# Patient Record
Sex: Female | Born: 1950 | Race: Black or African American | Hispanic: No | Marital: Married | Smoking: Never smoker
Health system: Southern US, Community
[De-identification: ages and names within clinical notes are randomized; demographics above are authoritative.]

## PROBLEM LIST (undated history)

## (undated) DIAGNOSIS — K219 Gastro-esophageal reflux disease without esophagitis: Secondary | ICD-10-CM

## (undated) DIAGNOSIS — J45909 Unspecified asthma, uncomplicated: Secondary | ICD-10-CM

## (undated) DIAGNOSIS — G4730 Sleep apnea, unspecified: Secondary | ICD-10-CM

## (undated) DIAGNOSIS — I350 Nonrheumatic aortic (valve) stenosis: Secondary | ICD-10-CM

## (undated) DIAGNOSIS — E119 Type 2 diabetes mellitus without complications: Secondary | ICD-10-CM

## (undated) DIAGNOSIS — M1990 Unspecified osteoarthritis, unspecified site: Secondary | ICD-10-CM

## (undated) DIAGNOSIS — I10 Essential (primary) hypertension: Secondary | ICD-10-CM

## (undated) DIAGNOSIS — R072 Precordial pain: Secondary | ICD-10-CM

## (undated) DIAGNOSIS — E785 Hyperlipidemia, unspecified: Secondary | ICD-10-CM

## (undated) DIAGNOSIS — J189 Pneumonia, unspecified organism: Secondary | ICD-10-CM

## (undated) HISTORY — PX: TONSILLECTOMY: SUR1361

## (undated) HISTORY — DX: Unspecified osteoarthritis, unspecified site: M19.90

## (undated) HISTORY — DX: Unspecified asthma, uncomplicated: J45.909

## (undated) HISTORY — DX: Hyperlipidemia, unspecified: E78.5

## (undated) HISTORY — PX: TUBAL LIGATION: SHX77

## (undated) HISTORY — DX: Gastro-esophageal reflux disease without esophagitis: K21.9

## (undated) HISTORY — DX: Nonrheumatic aortic (valve) stenosis: I35.0

## (undated) HISTORY — DX: Precordial pain: R07.2

## (undated) HISTORY — DX: Sleep apnea, unspecified: G47.30

## (undated) HISTORY — DX: Type 2 diabetes mellitus without complications: E11.9

## (undated) HISTORY — DX: Essential (primary) hypertension: I10

## (undated) HISTORY — DX: Pneumonia, unspecified organism: J18.9

## (undated) HISTORY — PX: JOINT REPLACEMENT: SHX530

---

## 1977-01-02 HISTORY — PX: TONSILLECTOMY: SHX5217

## 1987-01-03 HISTORY — PX: ABDOMINAL HYSTERECTOMY: SHX81

## 2002-03-02 ENCOUNTER — Encounter: Payer: Self-pay | Admitting: Emergency Medicine

## 2002-03-02 ENCOUNTER — Emergency Department (HOSPITAL_COMMUNITY): Admission: EM | Admit: 2002-03-02 | Discharge: 2002-03-02 | Payer: Self-pay | Admitting: Emergency Medicine

## 2002-04-23 ENCOUNTER — Encounter: Admission: RE | Admit: 2002-04-23 | Discharge: 2002-04-23 | Payer: Self-pay | Admitting: Nephrology

## 2002-04-23 ENCOUNTER — Encounter: Payer: Self-pay | Admitting: Nephrology

## 2002-07-18 ENCOUNTER — Encounter: Payer: Self-pay | Admitting: Cardiology

## 2002-07-18 ENCOUNTER — Ambulatory Visit: Admission: RE | Admit: 2002-07-18 | Discharge: 2002-07-18 | Payer: Self-pay | Admitting: Nephrology

## 2005-08-31 ENCOUNTER — Ambulatory Visit: Payer: Self-pay | Admitting: Internal Medicine

## 2005-10-18 ENCOUNTER — Ambulatory Visit: Payer: Self-pay | Admitting: Internal Medicine

## 2005-11-01 ENCOUNTER — Ambulatory Visit: Payer: Self-pay | Admitting: Internal Medicine

## 2005-11-01 ENCOUNTER — Encounter (INDEPENDENT_AMBULATORY_CARE_PROVIDER_SITE_OTHER): Payer: Self-pay | Admitting: Specialist

## 2006-10-22 ENCOUNTER — Telehealth (INDEPENDENT_AMBULATORY_CARE_PROVIDER_SITE_OTHER): Payer: Self-pay | Admitting: *Deleted

## 2006-12-18 ENCOUNTER — Telehealth (INDEPENDENT_AMBULATORY_CARE_PROVIDER_SITE_OTHER): Payer: Self-pay | Admitting: *Deleted

## 2007-01-03 HISTORY — PX: LAPAROSCOPIC GASTRIC BANDING: SHX1100

## 2007-04-01 ENCOUNTER — Ambulatory Visit: Payer: Self-pay | Admitting: Family Medicine

## 2007-04-01 DIAGNOSIS — Z9189 Other specified personal risk factors, not elsewhere classified: Secondary | ICD-10-CM | POA: Insufficient documentation

## 2007-04-01 DIAGNOSIS — E1165 Type 2 diabetes mellitus with hyperglycemia: Secondary | ICD-10-CM | POA: Insufficient documentation

## 2007-04-01 DIAGNOSIS — M170 Bilateral primary osteoarthritis of knee: Secondary | ICD-10-CM

## 2007-04-01 DIAGNOSIS — R0602 Shortness of breath: Secondary | ICD-10-CM | POA: Insufficient documentation

## 2007-04-01 DIAGNOSIS — E1159 Type 2 diabetes mellitus with other circulatory complications: Secondary | ICD-10-CM | POA: Insufficient documentation

## 2007-04-01 DIAGNOSIS — E785 Hyperlipidemia, unspecified: Secondary | ICD-10-CM | POA: Insufficient documentation

## 2007-04-01 DIAGNOSIS — N63 Unspecified lump in unspecified breast: Secondary | ICD-10-CM | POA: Insufficient documentation

## 2007-04-01 DIAGNOSIS — I10 Essential (primary) hypertension: Secondary | ICD-10-CM | POA: Insufficient documentation

## 2007-04-01 DIAGNOSIS — IMO0002 Reserved for concepts with insufficient information to code with codable children: Secondary | ICD-10-CM

## 2007-04-01 DIAGNOSIS — R011 Cardiac murmur, unspecified: Secondary | ICD-10-CM

## 2007-04-01 HISTORY — DX: Shortness of breath: R06.02

## 2007-04-01 HISTORY — DX: Type 2 diabetes mellitus with hyperglycemia: E11.65

## 2007-04-01 HISTORY — DX: Bilateral primary osteoarthritis of knee: M17.0

## 2007-04-01 HISTORY — DX: Reserved for concepts with insufficient information to code with codable children: IMO0002

## 2007-04-01 HISTORY — DX: Other specified personal risk factors, not elsewhere classified: Z91.89

## 2007-04-01 HISTORY — DX: Morbid (severe) obesity due to excess calories: E66.01

## 2007-04-01 HISTORY — DX: Cardiac murmur, unspecified: R01.1

## 2007-04-05 ENCOUNTER — Encounter (INDEPENDENT_AMBULATORY_CARE_PROVIDER_SITE_OTHER): Payer: Self-pay | Admitting: *Deleted

## 2007-04-11 ENCOUNTER — Encounter: Payer: Self-pay | Admitting: Family Medicine

## 2007-04-15 ENCOUNTER — Encounter: Payer: Self-pay | Admitting: Family Medicine

## 2007-04-15 ENCOUNTER — Ambulatory Visit: Payer: Self-pay

## 2007-04-19 ENCOUNTER — Encounter (INDEPENDENT_AMBULATORY_CARE_PROVIDER_SITE_OTHER): Payer: Self-pay | Admitting: *Deleted

## 2007-05-01 ENCOUNTER — Ambulatory Visit: Payer: Self-pay | Admitting: Pulmonary Disease

## 2007-05-01 DIAGNOSIS — G4733 Obstructive sleep apnea (adult) (pediatric): Secondary | ICD-10-CM | POA: Insufficient documentation

## 2007-05-01 HISTORY — DX: Obstructive sleep apnea (adult) (pediatric): G47.33

## 2007-05-06 ENCOUNTER — Ambulatory Visit: Admission: RE | Admit: 2007-05-06 | Discharge: 2007-05-06 | Payer: Self-pay | Admitting: Pulmonary Disease

## 2007-05-06 ENCOUNTER — Encounter: Payer: Self-pay | Admitting: Pulmonary Disease

## 2007-05-08 ENCOUNTER — Encounter: Payer: Self-pay | Admitting: Pulmonary Disease

## 2007-05-08 ENCOUNTER — Ambulatory Visit (HOSPITAL_BASED_OUTPATIENT_CLINIC_OR_DEPARTMENT_OTHER): Admission: RE | Admit: 2007-05-08 | Discharge: 2007-05-08 | Payer: Self-pay | Admitting: Pulmonary Disease

## 2007-05-13 ENCOUNTER — Telehealth (INDEPENDENT_AMBULATORY_CARE_PROVIDER_SITE_OTHER): Payer: Self-pay | Admitting: *Deleted

## 2007-05-20 ENCOUNTER — Ambulatory Visit: Payer: Self-pay | Admitting: Pulmonary Disease

## 2007-05-23 ENCOUNTER — Ambulatory Visit: Payer: Self-pay | Admitting: Pulmonary Disease

## 2007-05-24 ENCOUNTER — Telehealth (INDEPENDENT_AMBULATORY_CARE_PROVIDER_SITE_OTHER): Payer: Self-pay | Admitting: *Deleted

## 2007-05-29 ENCOUNTER — Telehealth (INDEPENDENT_AMBULATORY_CARE_PROVIDER_SITE_OTHER): Payer: Self-pay | Admitting: *Deleted

## 2007-06-04 ENCOUNTER — Encounter: Payer: Self-pay | Admitting: Family Medicine

## 2007-06-04 ENCOUNTER — Telehealth (INDEPENDENT_AMBULATORY_CARE_PROVIDER_SITE_OTHER): Payer: Self-pay | Admitting: *Deleted

## 2007-06-04 DIAGNOSIS — I519 Heart disease, unspecified: Secondary | ICD-10-CM | POA: Insufficient documentation

## 2007-06-04 HISTORY — DX: Heart disease, unspecified: I51.9

## 2007-06-05 ENCOUNTER — Encounter (INDEPENDENT_AMBULATORY_CARE_PROVIDER_SITE_OTHER): Payer: Self-pay | Admitting: *Deleted

## 2007-06-18 ENCOUNTER — Ambulatory Visit: Payer: Self-pay | Admitting: Pulmonary Disease

## 2007-06-20 ENCOUNTER — Ambulatory Visit: Payer: Self-pay | Admitting: Cardiology

## 2007-06-20 ENCOUNTER — Ambulatory Visit: Payer: Self-pay

## 2007-06-20 LAB — CONVERTED CEMR LAB: Pro B Natriuretic peptide (BNP): 23 pg/mL (ref 0.0–100.0)

## 2007-06-24 ENCOUNTER — Ambulatory Visit: Payer: Self-pay

## 2007-06-26 ENCOUNTER — Ambulatory Visit: Payer: Self-pay

## 2007-06-26 ENCOUNTER — Encounter: Payer: Self-pay | Admitting: Family Medicine

## 2007-07-12 ENCOUNTER — Ambulatory Visit: Payer: Self-pay | Admitting: Family Medicine

## 2007-07-24 LAB — CONVERTED CEMR LAB
ALT: 20 units/L (ref 0–35)
AST: 17 units/L (ref 0–37)
Albumin: 3.6 g/dL (ref 3.5–5.2)
BUN: 17 mg/dL (ref 6–23)
CO2: 33 meq/L — ABNORMAL HIGH (ref 19–32)
Chloride: 102 meq/L (ref 96–112)
Cholesterol: 213 mg/dL (ref 0–200)
Creatinine, Ser: 0.9 mg/dL (ref 0.4–1.2)
Creatinine,U: 270.7 mg/dL
Direct LDL: 173.1 mg/dL

## 2007-07-25 ENCOUNTER — Telehealth (INDEPENDENT_AMBULATORY_CARE_PROVIDER_SITE_OTHER): Payer: Self-pay | Admitting: *Deleted

## 2007-07-25 ENCOUNTER — Encounter (INDEPENDENT_AMBULATORY_CARE_PROVIDER_SITE_OTHER): Payer: Self-pay | Admitting: *Deleted

## 2007-08-21 ENCOUNTER — Encounter: Payer: Self-pay | Admitting: Family Medicine

## 2007-08-30 ENCOUNTER — Ambulatory Visit (HOSPITAL_COMMUNITY): Admission: RE | Admit: 2007-08-30 | Discharge: 2007-08-30 | Payer: 59 | Admitting: Surgery

## 2007-09-16 ENCOUNTER — Ambulatory Visit (HOSPITAL_COMMUNITY): Admission: RE | Admit: 2007-09-16 | Discharge: 2007-09-16 | Payer: Self-pay | Admitting: Surgery

## 2007-09-25 ENCOUNTER — Encounter: Payer: Self-pay | Admitting: Family Medicine

## 2007-09-30 ENCOUNTER — Encounter: Admission: RE | Admit: 2007-09-30 | Discharge: 2007-09-30 | Payer: Self-pay | Admitting: Surgery

## 2007-11-05 ENCOUNTER — Encounter: Payer: Self-pay | Admitting: Family Medicine

## 2007-12-02 ENCOUNTER — Ambulatory Visit: Payer: Self-pay | Admitting: Cardiology

## 2007-12-18 ENCOUNTER — Ambulatory Visit (HOSPITAL_COMMUNITY): Admission: RE | Admit: 2007-12-18 | Discharge: 2007-12-18 | Payer: Self-pay | Admitting: Surgery

## 2008-01-30 ENCOUNTER — Encounter: Admission: RE | Admit: 2008-01-30 | Discharge: 2008-01-30 | Payer: Self-pay | Admitting: Surgery

## 2008-02-17 ENCOUNTER — Ambulatory Visit (HOSPITAL_COMMUNITY): Admission: RE | Admit: 2008-02-17 | Discharge: 2008-02-18 | Payer: Self-pay | Admitting: Surgery

## 2008-03-16 ENCOUNTER — Encounter: Admission: RE | Admit: 2008-03-16 | Discharge: 2008-06-14 | Payer: Self-pay | Admitting: Surgery

## 2008-04-16 ENCOUNTER — Telehealth (INDEPENDENT_AMBULATORY_CARE_PROVIDER_SITE_OTHER): Payer: Self-pay | Admitting: *Deleted

## 2008-04-28 ENCOUNTER — Telehealth (INDEPENDENT_AMBULATORY_CARE_PROVIDER_SITE_OTHER): Payer: Self-pay | Admitting: *Deleted

## 2008-07-01 ENCOUNTER — Ambulatory Visit: Payer: Self-pay

## 2008-07-01 ENCOUNTER — Encounter: Payer: Self-pay | Admitting: Cardiology

## 2008-07-01 DIAGNOSIS — I6529 Occlusion and stenosis of unspecified carotid artery: Secondary | ICD-10-CM | POA: Insufficient documentation

## 2008-07-01 HISTORY — DX: Occlusion and stenosis of unspecified carotid artery: I65.29

## 2008-07-27 ENCOUNTER — Ambulatory Visit: Payer: Self-pay | Admitting: Family Medicine

## 2008-07-27 DIAGNOSIS — Z9884 Bariatric surgery status: Secondary | ICD-10-CM | POA: Insufficient documentation

## 2008-07-27 DIAGNOSIS — Z8719 Personal history of other diseases of the digestive system: Secondary | ICD-10-CM | POA: Insufficient documentation

## 2008-07-27 HISTORY — DX: Bariatric surgery status: Z98.84

## 2008-07-27 LAB — CONVERTED CEMR LAB
Blood in Urine, dipstick: NEGATIVE
Ketones, urine, test strip: NEGATIVE
Nitrite: NEGATIVE
Specific Gravity, Urine: 1.025
Urobilinogen, UA: NEGATIVE

## 2008-07-30 ENCOUNTER — Encounter: Payer: Self-pay | Admitting: Family Medicine

## 2008-07-30 ENCOUNTER — Telehealth (INDEPENDENT_AMBULATORY_CARE_PROVIDER_SITE_OTHER): Payer: Self-pay | Admitting: *Deleted

## 2008-08-02 LAB — CONVERTED CEMR LAB
Albumin: 3.7 g/dL (ref 3.5–5.2)
Alkaline Phosphatase: 76 units/L (ref 39–117)
Calcium: 9.1 mg/dL (ref 8.4–10.5)
Creatinine, Ser: 0.7 mg/dL (ref 0.4–1.2)
Creatinine,U: 169.3 mg/dL
GFR calc non Af Amer: 109.63 mL/min (ref >60)
Glucose, Bld: 108 mg/dL — ABNORMAL HIGH (ref 70–99)
HDL: 35.8 mg/dL — ABNORMAL LOW (ref >39.00)
Hgb A1c MFr Bld: 6.7 % — ABNORMAL HIGH (ref 4.6–6.5)
Microalb Creat Ratio: 5.9 mg/g (ref 0.0–30.0)
Microalb, Ur: 1 mg/dL (ref 0.0–1.9)
Sodium: 142 meq/L (ref 135–145)
Triglycerides: 44 mg/dL (ref 0.0–149.0)

## 2008-08-04 ENCOUNTER — Telehealth (INDEPENDENT_AMBULATORY_CARE_PROVIDER_SITE_OTHER): Payer: Self-pay | Admitting: *Deleted

## 2008-08-12 ENCOUNTER — Encounter (INDEPENDENT_AMBULATORY_CARE_PROVIDER_SITE_OTHER): Payer: Self-pay | Admitting: *Deleted

## 2008-08-19 ENCOUNTER — Telehealth (INDEPENDENT_AMBULATORY_CARE_PROVIDER_SITE_OTHER): Payer: Self-pay | Admitting: *Deleted

## 2008-08-27 ENCOUNTER — Encounter (INDEPENDENT_AMBULATORY_CARE_PROVIDER_SITE_OTHER): Payer: Self-pay | Admitting: *Deleted

## 2009-02-02 ENCOUNTER — Ambulatory Visit: Payer: Self-pay | Admitting: Family Medicine

## 2009-02-05 ENCOUNTER — Telehealth (INDEPENDENT_AMBULATORY_CARE_PROVIDER_SITE_OTHER): Payer: Self-pay | Admitting: *Deleted

## 2009-02-05 LAB — CONVERTED CEMR LAB
ALT: 15 units/L (ref 0–35)
Albumin: 4.2 g/dL (ref 3.5–5.2)
BUN: 26 mg/dL — ABNORMAL HIGH (ref 6–23)
CO2: 32 meq/L (ref 19–32)
Calcium: 9.4 mg/dL (ref 8.4–10.5)
Chloride: 104 meq/L (ref 96–112)
Cholesterol: 208 mg/dL — ABNORMAL HIGH (ref 0–200)
Creatinine, Ser: 1 mg/dL (ref 0.4–1.2)
Glucose, Bld: 112 mg/dL — ABNORMAL HIGH (ref 70–99)
Total Bilirubin: 0.3 mg/dL (ref 0.3–1.2)
Total Protein: 6.6 g/dL (ref 6.0–8.3)
Triglycerides: 67 mg/dL (ref 0.0–149.0)

## 2009-03-31 ENCOUNTER — Ambulatory Visit: Payer: Self-pay | Admitting: Cardiology

## 2009-03-31 DIAGNOSIS — I359 Nonrheumatic aortic valve disorder, unspecified: Secondary | ICD-10-CM

## 2009-03-31 HISTORY — DX: Nonrheumatic aortic valve disorder, unspecified: I35.9

## 2009-04-06 ENCOUNTER — Telehealth (INDEPENDENT_AMBULATORY_CARE_PROVIDER_SITE_OTHER): Payer: Self-pay | Admitting: *Deleted

## 2009-04-28 ENCOUNTER — Telehealth: Payer: Self-pay | Admitting: Cardiology

## 2009-05-03 ENCOUNTER — Ambulatory Visit: Payer: Self-pay | Admitting: Family Medicine

## 2009-05-04 LAB — CONVERTED CEMR LAB
AST: 16 units/L (ref 0–37)
Albumin: 3.7 g/dL (ref 3.5–5.2)
Alkaline Phosphatase: 60 units/L (ref 39–117)
Calcium: 9 mg/dL (ref 8.4–10.5)
Creatinine,U: 212.1 mg/dL
GFR calc non Af Amer: 93.73 mL/min (ref >60)
HDL: 50.1 mg/dL (ref >39.00)
Hgb A1c MFr Bld: 7.1 % — ABNORMAL HIGH (ref 4.6–6.5)
LDL Cholesterol: 66 mg/dL (ref 0–99)
Microalb Creat Ratio: 6.1 mg/g (ref 0.0–30.0)
Potassium: 3.7 meq/L (ref 3.5–5.1)
Sodium: 144 meq/L (ref 135–145)
Total Bilirubin: 0.5 mg/dL (ref 0.3–1.2)
VLDL: 8.4 mg/dL (ref 0.0–40.0)

## 2009-10-07 ENCOUNTER — Telehealth (INDEPENDENT_AMBULATORY_CARE_PROVIDER_SITE_OTHER): Payer: Self-pay | Admitting: *Deleted

## 2009-10-13 ENCOUNTER — Ambulatory Visit: Payer: Self-pay | Admitting: Family Medicine

## 2009-11-09 ENCOUNTER — Telehealth (INDEPENDENT_AMBULATORY_CARE_PROVIDER_SITE_OTHER): Payer: Self-pay | Admitting: *Deleted

## 2009-11-11 ENCOUNTER — Encounter (INDEPENDENT_AMBULATORY_CARE_PROVIDER_SITE_OTHER): Payer: Self-pay | Admitting: *Deleted

## 2009-11-12 ENCOUNTER — Encounter: Payer: Self-pay | Admitting: Family Medicine

## 2009-11-17 LAB — CONVERTED CEMR LAB
AST: 22 units/L (ref 0–37)
Albumin: 4.1 g/dL (ref 3.5–5.2)
Basophils Absolute: 0 10*3/uL (ref 0.0–0.1)
Bilirubin, Direct: 0.1 mg/dL (ref 0.0–0.3)
CO2: 27 meq/L (ref 19–32)
Chloride: 101 meq/L (ref 96–112)
Eosinophils Relative: 2 % (ref 0–5)
Glucose, Bld: 139 mg/dL — ABNORMAL HIGH (ref 70–99)
HCT: 38.3 % (ref 36.0–46.0)
HDL: 41 mg/dL (ref >39)
Hgb A1c MFr Bld: 8.4 % — ABNORMAL HIGH (ref <5.7)
LDL Cholesterol: 72 mg/dL (ref 0–99)
Lymphocytes Relative: 38 % (ref 12–46)
Lymphs Abs: 2.3 10*3/uL (ref 0.7–4.0)
Neutro Abs: 3.2 10*3/uL (ref 1.7–7.7)
Platelets: 207 10*3/uL (ref 150–400)
Potassium: 3.9 meq/L (ref 3.5–5.3)
RDW: 15.5 % (ref 11.5–15.5)
Sodium: 141 meq/L (ref 135–145)
TSH: 1.446 microintl units/mL (ref 0.350–4.500)
Total Bilirubin: 0.6 mg/dL (ref 0.3–1.2)
Total CHOL/HDL Ratio: 3.1
VLDL: 13 mg/dL (ref 0–40)
WBC: 6 10*3/uL (ref 4.0–10.5)

## 2009-11-25 IMAGING — CR DG ABDOMEN 1V
1 series · 1 of 1 positions shown · non-contrast
Comparison: None

CLINICAL DATA: Morbid obesity.  Assess lap band.

ABDOMEN - 1 VIEW

[t abdomen supine]
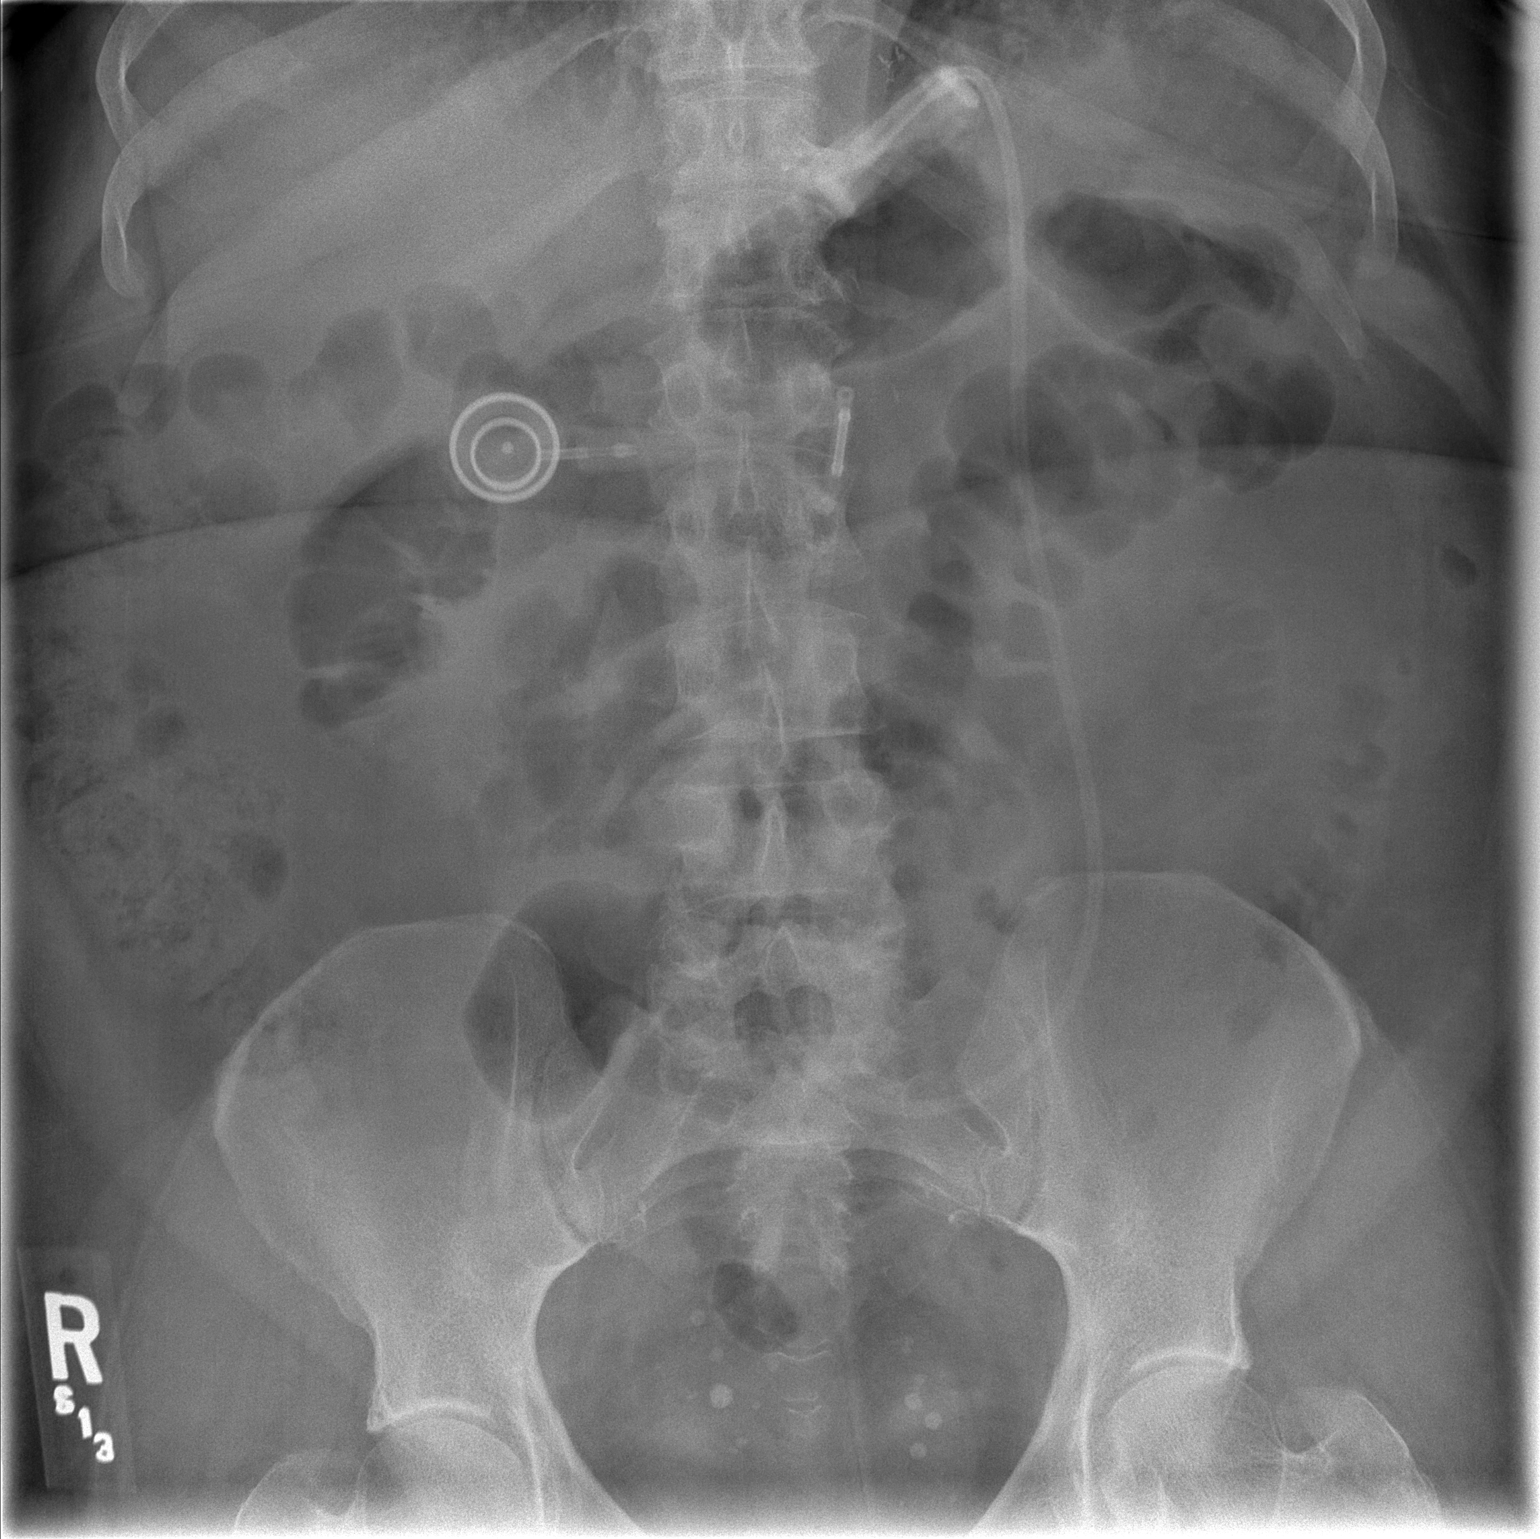

[1 of 1 positions shown; findings below may reference images not displayed]

FINDINGS: The lap band appears in satisfactory position.  It is
inclined from approximately the 2 o'clock to the 8 o'clock
position.  Bowel gas pattern unremarkable.
IMPRESSION: Satisfactory appearance of lap band.

## 2009-12-28 ENCOUNTER — Emergency Department (HOSPITAL_BASED_OUTPATIENT_CLINIC_OR_DEPARTMENT_OTHER)
Admission: EM | Admit: 2009-12-28 | Discharge: 2009-12-28 | Payer: Self-pay | Source: Home / Self Care | Admitting: Emergency Medicine

## 2010-01-25 ENCOUNTER — Encounter: Payer: Self-pay | Admitting: Family Medicine

## 2010-01-30 LAB — CONVERTED CEMR LAB
AST: 13 units/L (ref 0–37)
Albumin: 3.8 g/dL (ref 3.5–5.2)
Alkaline Phosphatase: 93 units/L (ref 39–117)
BUN: 17 mg/dL (ref 6–23)
CRP, High Sensitivity: 8 — ABNORMAL HIGH (ref 0.00–5.00)
Chloride: 105 meq/L (ref 96–112)
Eosinophils Absolute: 0.1 10*3/uL (ref 0.0–0.7)
Eosinophils Relative: 0.8 % (ref 0.0–5.0)
GFR calc non Af Amer: 68 mL/min
HDL: 31.2 mg/dL — ABNORMAL LOW (ref >39.0)
Hgb A1c MFr Bld: 8.5 % — ABNORMAL HIGH (ref 4.6–6.0)
LDL Cholesterol: 110 mg/dL — ABNORMAL HIGH (ref 0–99)
Lymphocytes Relative: 27.2 % (ref 12.0–46.0)
MCV: 72.8 fL — ABNORMAL LOW (ref 78.0–100.0)
Neutrophils Relative %: 62 % (ref 43.0–77.0)
Platelets: 266 10*3/uL (ref 150–400)
Potassium: 3.9 meq/L (ref 3.5–5.1)
Total Bilirubin: 0.7 mg/dL (ref 0.3–1.2)
Total CHOL/HDL Ratio: 5
VLDL: 15 mg/dL (ref 0–40)
WBC: 7.7 10*3/uL (ref 4.5–10.5)

## 2010-02-01 NOTE — Progress Notes (Signed)
Summary: refills  Phone Note Refill Request Call back at [facility] Phone [phone]   Refills Requested: Medication #1:  ZETIA 10 MG TABS 1 by mouth daily.  Medication #2:  CRESTOR 40 MG TABS take 1 by mouth  at bedtime.  Medication #3:  GLUCOPHAGE XR 500 MG XR24H-TAB 2 by mouth at bedtime. [facility]. Pt aware labs are due but needs a 1 month supply sent in to pharmacy until she able to get in for labs.................Marland KitchenFelecia Deloach CMA  November 09, 2009 9:12 AM      Prescriptions: GLUCOPHAGE XR 500 MG XR24H-TAB (METFORMIN HCL) 2 by mouth at bedtime  #60 x 0   Entered by:   Almeta Monas CMA (AAMA)   Authorized by:   Loreen Freud DO   Signed by:   Almeta Monas CMA (AAMA) on 11/09/2009   Method used:   Electronically to        [name]* (retail)       [address]       [address] Shipping/mailing       [place], [place]  [address]       Ph: [phone]       Fax: [phone]   RxID:   [id number] ZETIA 10 MG TABS (EZETIMIBE) 1 by mouth daily.  #30 Tablet x 0   Entered by:   Almeta Monas CMA (AAMA)   Authorized by:   Loreen Freud DO   Signed by:   Almeta Monas CMA (AAMA) on 11/09/2009   Method used:   Electronically to        [name]* (retail)       [address]       [address] Shipping/mailing       [place], [place]  [address]       Ph: [phone]       Fax: [phone]   RxID:   [id number] CRESTOR 40 MG TABS (ROSUVASTATIN CALCIUM) take 1 by mouth  at bedtime.  #30 x 0   Entered by:   Almeta Monas CMA (AAMA)   Authorized by:   Loreen Freud DO   Signed by:   Almeta Monas CMA (AAMA) on 11/09/2009   Method used:   Electronically to        [name]* (retail)       [address]       [address] Shipping/mailing       [place], [place]  [address]       Ph: [phone]       Fax: [phone]   RxID:   [id number]

## 2010-02-01 NOTE — Progress Notes (Signed)
Summary: Lab Results   Phone Note Outgoing Call   Summary of Call: Regarding lab results, LMTCB:  better but LDL still elevated--- con't crestor---  add zetia 10 mg #30 1 by mouth once daily , 2 refills-----recheck 3 months   lipid, hep, 272.4   Signed by Loreen Freud DO on 02/04/2009 at 7:42 PM Initial call taken by: Army Fossa CMA,  February 05, 2009 4:42 PM  Follow-up for Phone Call        [facility]. Army Fossa CMA  February 10, 2009 2:21 PM   Additional Follow-up for Phone Call Additional follow up Details #1::        LMTCB. Army Fossa CMA  February 17, 2009 1:53 PM     Additional Follow-up for Phone Call Additional follow up Details #2::    Pt is aware. Army Fossa CMA  February 18, 2009 12:18 PM   New/Updated Medications: ZETIA 10 MG TABS (EZETIMIBE) 1 by mouth daily. Prescriptions: CRESTOR 40 MG TABS (ROSUVASTATIN CALCIUM) take 1 by mouth  at bedtime.  #30 x 2   Entered by:   Army Fossa CMA   Authorized by:   Loreen Freud DO   Signed by:   Army Fossa CMA on 02/18/2009   Method used:   Electronically to        [name]* (retail)       [address]       [address] Shipping/mailing       [place], [place]  [address]       Ph: [phone]       Fax: [phone]   RxID:   [id number] ZETIA 10 MG TABS (EZETIMIBE) 1 by mouth daily.  #30 x 2   Entered by:   Army Fossa CMA   Authorized by:   Loreen Freud DO   Signed by:   Army Fossa CMA on 02/18/2009   Method used:   Electronically to        [facility]* (retail)       [address]       [address] Shipping/mailing       [place], [place]  [address]       Ph: [phone]       Fax: [phone]   RxID:   [id number]

## 2010-02-01 NOTE — Progress Notes (Signed)
Summary: Virginia Flores needs refill   Phone Note Refill Request Message from:  Patient  Refills Requested: Medication #1:  LABETALOL HCL 200 MG TABS 2 po two times a day. NEEDS OFFICE VISIT BEFORE ADDTIONAL REFILLS. Virginia Flores still cant get her refill and she needs it. Dr Laury Axon filled it for her this time but it will only last for one month and she needs a year supply  Initial call taken by: Omer Jack,  April 28, 2009 4:58 PM    Prescriptions: LABETALOL HCL 200 MG TABS (LABETALOL HCL) 2 po two times a day. NEEDS OFFICE VISIT BEFORE ADDTIONAL REFILLS.  #360 x 3   Entered by:   Kem Parkinson   Authorized by:   Ferman Hamming, MD, [facility]   Signed by:   Kem Parkinson on 04/29/2009   Method used:   Electronically to        [facility]  [address] [phone]* (retail)       [address]       [place], [place]  [address]       Ph: [phone] or [phone]       Fax: [phone]   RxID:   [id number]

## 2010-02-01 NOTE — Miscellaneous (Signed)
Summary: Orders Update   Clinical Lists Changes  Orders: Added new Test order of T-Lipid Profile [id number]) - Signed Added new Test order of T-Hepatic Function [id number]) - Signed Added new Test order of T- Hemoglobin A1C ([id number]) - Signed Added new Test order of T-Basic Metabolic Panel [id number]) - Signed Added new Test order of T-TSH ([id number]) - Signed Added new Test order of T-CBC w/Diff ([id number]) - Signed

## 2010-02-01 NOTE — Assessment & Plan Note (Signed)
Summary: roa per rx//lch   Vital Signs:  Patient profile:   60 year old female Weight:      261 pounds Pulse rate:   65 / minute Pulse rhythm:   regular BP sitting:   128 / 86  (left arm) Cuff size:   large  Vitals Entered By: Army Fossa CMA (May 03, 2009 11:04 AM) CC: Pt here to follow up on meds.    History of Present Illness:  Hyperlipidemia follow-up      This is a 60 year old woman who presents for Hyperlipidemia follow-up.  The patient denies muscle aches, GI upset, abdominal pain, flushing, itching, constipation, diarrhea, and fatigue.  The patient denies the following symptoms: chest pain/pressure, exercise intolerance, dypsnea, palpitations, syncope, and pedal edema.  Compliance with medications (by patient report) has been near 100%.  Dietary compliance has been fair.  The patient reports exercising occasionally.  Adjunctive measures currently used by the patient include fish oil supplements and weight reduction.    Hypertension follow-up      The patient also presents for Hypertension follow-up.  The patient denies lightheadedness, urinary frequency, headaches, edema, impotence, rash, and fatigue.  The patient denies the following associated symptoms: chest pain, chest pressure, exercise intolerance, dyspnea, palpitations, syncope, leg edema, and pedal edema.  Compliance with medications (by patient report) has been near 100%.  The patient reports that dietary compliance has been fair.  The patient reports exercising occasionally.  Adjunctive measures currently used by the patient include salt restriction.    Type 1 diabetes mellitus follow-up      The patient is also here for Type 2 diabetes mellitus follow-up.  The patient denies polyuria, polydipsia, blurred vision, self managed hypoglycemia, hypoglycemia requiring help, weight loss, weight gain, and numbness of extremities.  The patient denies the following symptoms: neuropathic pain, chest pain, vomiting, orthostatic  symptoms, poor wound healing, intermittent claudication, vision loss, and foot ulcer.  Since the last visit the patient reports poor dietary compliance, compliance with medications, not exercising regularly, and monitoring blood glucose.  The patient has been measuring capillary blood glucose before breakfast.    Current Medications (verified): 1)  Labetalol Hcl 200 Mg Tabs (Labetalol Hcl) .... 2 Po Two Times A Day. Needs Office Visit Before Addtional Refills. 2)  Zestoretic 20-25 Mg Tabs (Lisinopril-Hydrochlorothiazide) .... Take One Tablet Twice Daily 3)  Crestor 40 Mg Tabs (Rosuvastatin Calcium) .... Take 1 By Mouth  At Bedtime. 4)  Zetia 10 Mg Tabs (Ezetimibe) .[name].. 1 By Mouth Daily. 5)  Glucophage Xr 500 Mg Xr24h-Tab (Metformin Hcl) .[name].. 1 By Mouth At Bedtime  Allergies: 1)  ! Penicillin  Past History:  Past medical, surgical, family and social histories (including risk factors) reviewed for relevance to current acute and chronic problems.  Past Medical History: Reviewed history from 03/31/2009 and no changes required. 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 Hysterectomy BARIATRIC SURGERY STATUS (ICD-V45.86)  Family History: Reviewed history from 02/09/2009 and no changes required. Family History of CAD Female 1st degree relative <60 Family History of CAD Female 1st degree relative <50 Family History Diabetes 1st degree relative Family History High cholesterol Family History Hypertension Family History of Stroke F 1st degree relative <60  heart disease: father, mother,  brother sister rheumatism:  paternal grandmother MI-CABG-siblings (2)  Social History: Reviewed history from 02/09/2009 and no changes required. Occupation:post oprecovery care RN Married Never Smoked Alcohol use-no Drug use-no Regular exercise-no adopted 3 children 2 boys from [place] 1 adopted daughter  Review of Systems      See HPI  Physical Exam  General:  Well-developed,well-nourished,in no acute distress; alert,appropriate and cooperative throughout examinationoverweight-appearing.   Lungs:  Normal respiratory effort, chest expands symmetrically. Lungs are clear to auscultation, no crackles or wheezes. Heart:  +murmur 2/6normal rate.   Extremities:  No clubbing, cyanosis, edema, or deformity noted with normal full range of motion of all joints.    Diabetes Management Exam:    Foot Exam (with socks and/or shoes not present):       Sensory-Pinprick/Light touch:          Left medial foot (L-4): normal          Left dorsal foot (L-5): normal          Left lateral foot (S-1): normal          Right medial foot (L-4): normal          Right dorsal foot (L-5): normal          Right lateral foot (S-1): normal       Sensory-Monofilament:          Left foot: normal          Right foot: normal       Inspection:          Left foot: normal          Right foot: normal       Nails:          Left foot: normal          Right foot: normal    Eye Exam:       Eye Exam done elsewhere   Impression & Recommendations:  Problem # 1:  HYPERTENSION (ICD-401.9)  Her updated medication list for this problem includes:    Labetalol Hcl 200 Mg Tabs (Labetalol hcl) .[name]... 2 po two times a day. needs office visit before addtional refills.    Zestoretic 20-25 Mg Tabs (Lisinopril-hydrochlorothiazide) .[name]... Take one tablet twice daily  Orders: Venipuncture ([address]) TLB-Lipid Panel (80061-LIPID) TLB-BMP (Basic Metabolic Panel-BMET) (80048-METABOL) TLB-Hepatic/Liver Function Pnl (80076-HEPATIC) TLB-A1C / Hgb A1C  (Glycohemoglobin) (83036-A1C) TLB-Microalbumin/Creat Ratio, Urine (82043-MALB)  BP today: 128/86 Prior BP: 128/70 (03/31/2009)  Labs Reviewed: K+: 3.7 (02/02/2009) Creat: : 1.0 (02/02/2009)   Chol: 208 (02/02/2009)   HDL: 52.30 (02/02/2009)   LDL: DEL (07/12/2007)   TG: 67.0 (02/02/2009)  Problem # 2:  HYPERLIPIDEMIA (ICD-272.4)  Her updated medication list for this problem includes:    Crestor 40 Mg Tabs (Rosuvastatin calcium) .[name]... Take 1 by mouth  at bedtime.    Zetia 10 Mg Tabs (Ezetimibe) .[name]... 1 by mouth daily.  Orders: Venipuncture ([address]) TLB-Lipid Panel (80061-LIPID) TLB-BMP (Basic Metabolic Panel-BMET) (80048-METABOL) TLB-Hepatic/Liver Function Pnl (80076-HEPATIC) TLB-A1C / Hgb A1C (Glycohemoglobin) (83036-A1C) TLB-Microalbumin/Creat Ratio, Urine (82043-MALB)  Labs Reviewed: SGOT: 15 (02/02/2009)   SGPT: 15 (02/02/2009)   HDL:52.30 (02/02/2009), 35.80 (07/27/2008)  LDL:DEL (07/12/2007), 110 (04/01/2007)  Chol:208 (02/02/2009), 242 (07/27/2008)  Trig:67.0 (02/02/2009), 44.0 (07/27/2008)  Problem # 3:  DIABETES MELLITUS, TYPE II (ICD-250.00)  Her updated medication list for this problem includes:    Zestoretic 20-25 Mg Tabs (Lisinopril-hydrochlorothiazide) .[name]... Take one tablet twice daily    Glucophage Xr 500 Mg Xr24h-tab (Metformin hcl) .Marland KitchenMarland KitchenMarland KitchenMarland Kitchen  1 by mouth at bedtime  Orders: Ophthalmology Referral (Ophthalmology) Venipuncture [phone]) TLB-Lipid Panel (80061-LIPID) TLB-BMP (Basic Metabolic Panel-BMET) (80048-METABOL) TLB-Hepatic/Liver Function Pnl (80076-HEPATIC) TLB-A1C / Hgb A1C (Glycohemoglobin) (83036-A1C) TLB-Microalbumin/Creat Ratio, Urine (82043-MALB)  Labs Reviewed: Creat: 1.0 (02/02/2009)    Reviewed HgBA1c results: 6.5 (02/02/2009)  6.7 (07/27/2008)  Complete Medication List: 1)  Labetalol Hcl 200 Mg Tabs (Labetalol hcl) .... 2 po two times a day. needs office visit before addtional refills. 2)  Zestoretic 20-25 Mg Tabs  (Lisinopril-hydrochlorothiazide) .... Take one tablet twice daily 3)  Crestor 40 Mg Tabs (Rosuvastatin calcium) .... Take 1 by mouth  at bedtime. 4)  Zetia 10 Mg Tabs (Ezetimibe) .[name].. 1 by mouth daily. 5)  Glucophage Xr 500 Mg Xr24h-tab (Metformin hcl) .[name].. 1 by mouth at bedtime

## 2010-02-01 NOTE — Assessment & Plan Note (Signed)
Summary: rov per dpt call/lg  Medications Added FISH OIL   OIL (FISH OIL) 1 tab by mouth once daily        Referring Provider:  Laury Axon Primary Provider:  Laury Axon   History of Present Illness: Patient rescheduled.  Allergies: 1)  ! Penicillin  Vital Signs:  Patient profile:   60 year old female Height:      64 inches  Vitals Entered By: Kem Parkinson (February 10, 2009 12:07 PM)

## 2010-02-01 NOTE — Assessment & Plan Note (Signed)
Summary: rov per dpt call/lg    Visit Type:  1 yr f/u Referring Provider:  Laury Axon Primary Provider:  Laury Axon  CC:  rapid heart beat says it may be the caffine she drinks..no other complaints today.  History of Present Illness: Virginia Flores is a pleasant  female that I saw previously for preoperative evaluation prior to gastric banding surgery.  A nuclear study was performed on June 26, 2007.  There was no ischemia or infarction, and her ejection fraction was 70%.  She also had an echocardiogram on April 15, 2007, which showed normal LV function, very mild aortic stenosis, and moderate left atrial enlargement.  She also had carotid Dopplers performed in June of 2010  that showed a 0- 39% bilateral internal carotid artery stenosis.  I last saw her in November of 2009. Since then she has dyspnea with more extreme activities but not with routine activities. It is relieved with rest. It is not associated with chest pain. There is no orthopnea, PND, pedal edema or syncope. She does occasionally have elevated heart rate but she attributes this to caffeine. It lasts approximately 2 hours. It gradually resolves. There's no associated symptoms and she has not had syncope.   Current Medications (verified): 1)  Labetalol Hcl 200 Mg Tabs (Labetalol Hcl) .... 2 Po Two Times A Day 2)  Zestoretic 20-25 Mg Tabs (Lisinopril-Hydrochlorothiazide) .... Take One Tablet Twice Daily 3)  Crestor 40 Mg Tabs (Rosuvastatin Calcium) .... Take 1 By Mouth  At Bedtime. 4)  Zetia 10 Mg Tabs (Ezetimibe) .[name].. 1 By Mouth Daily.  Allergies: 1)  ! 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 Hysterectomy BARIATRIC SURGERY STATUS (ICD-V45.86)  Social History: Reviewed history from 02/09/2009 and no changes required. Occupation:post oprecovery care RN Married Never Smoked Alcohol use-no Drug use-no Regular exercise-no adopted 3 children 2 boys from [place] 1 adopted daughter  Review of Systems       Problems with arthritis but no fevers or chills, productive cough, hemoptysis, dysphasia, odynophagia, melena, hematochezia, dysuria, hematuria, rash, seizure activity, orthopnea, PND, pedal edema, claudication. Remaining systems are negative.   Vital Signs:  Patient profile:   60 year old female Height:      64 inches Weight:      259 pounds BMI:     44.62 Pulse rate:   62 / minute Pulse rhythm:   regular BP sitting:   128 / 70  (left arm) Cuff size:   large  Vitals Entered By: Danielle Rankin, CMA (March 31, 2009 10:05 AM)  Physical Exam  General:  Well-developed well-nourished in no acute distress.  Skin is warm and dry.  HEENT is normal.  Neck is supple. No thyromegaly.  Chest is clear to auscultation with normal expansion.  Cardiovascular exam is regular rate and rhythm. 2/6 systolic murmur left sternal border. S2 is not diminished. Abdominal exam nontender or distended. No masses palpated. Extremities show no edema. neuro grossly intact    EKG  Procedure date:  03/31/2009  Findings:      Sinus rhythm at a rate of 62. Left axis deviation. No significant ST changes.  Impression & Recommendations:  Problem # 1:  CAROTID ARTERY DISEASE (ICD-433.10) Plan followup  carotid Dopplers in one year when she returns. Continue statin. She states aspirin has caused GI bleeds in the past. The following medications were removed from the medication list:    Aspir-low 81 Mg Tbec (Aspirin) .[name]... 1 by mouth once daily  Problem # 2:  HYPERTENSION  (ICD-401.9) Blood pressure controlled on present medications. Will continue. The following medications were removed from the medication list:    Aspir-low 81 Mg Tbec (Aspirin) .[name]... 1 by mouth once daily Her updated medication list for this problem includes:    Labetalol Hcl 200 Mg Tabs (Labetalol hcl) .[name]... 2 po two times a day    Zestoretic 20-25 Mg Tabs (Lisinopril-hydrochlorothiazide) .[name]... Take one tablet twice daily  Problem # 3:  HYPERLIPIDEMIA (ICD-272.4) Continue present medications. Lipids and liver monitored by primary care. Her updated medication list for this problem includes:    Crestor 40 Mg Tabs (Rosuvastatin calcium) .[name]... Take 1 by mouth  at bedtime.    Zetia 10 Mg Tabs (Ezetimibe) .[name]... 1 by mouth daily.  Problem # 4:  DIABETES MELLITUS, TYPE II (ICD-250.00) Management per primary care. The following medications were removed from the medication list:    Aspir-low 81 Mg Tbec (Aspirin) .[name]... 1 by mouth once daily Her updated medication list for this problem includes:    Zestoretic 20-25 Mg Tabs (Lisinopril-hydrochlorothiazide) .[name]... Take one tablet twice daily  Problem # 5:  AORTIC VALVE DISORDERS (ICD-424.1) Plan repeat echocardiogram. Her updated medication list for this problem includes:    Labetalol Hcl 200 Mg Tabs (Labetalol hcl) .[name]... 2 po two times a day    Zestoretic 20-25 Mg Tabs (Lisinopril-hydrochlorothiazide) .[name]... Take one tablet twice daily  Other Orders: Echocardiogram (Echo)  Patient Instructions: 1)  Your physician recommends that you schedule a follow-up appointment in: ONE YEAR 2)  Your physician has requested that you have an echocardiogram.  Echocardiography is a painless test that uses sound waves to create images of your heart. It provides your doctor with information about the size and shape of your heart and how well your heart's chambers and valves are working.  This procedure takes approximately one hour. There are no restrictions for this  procedure.

## 2010-02-01 NOTE — Progress Notes (Signed)
Summary: Refill Request  Phone Note Refill Request Message from:  Pharmacy on [facility] Fax #: [phone]  Refills Requested: Medication #1:  LABETALOL HCL 200 MG TABS 2 po two times a day   Dosage confirmed as above?Dosage Confirmed   Supply Requested: 1 month   Last Refilled: 02/25/2009 Next Appointment Scheduled: none Initial call taken by: Harold Barban,  April 06, 2009 10:01 AM    New/Updated Medications: LABETALOL HCL 200 MG TABS (LABETALOL HCL) 2 po two times a day. NEEDS OFFICE VISIT BEFORE ADDTIONAL REFILLS. Prescriptions: LABETALOL HCL 200 MG TABS (LABETALOL HCL) 2 po two times a day. NEEDS OFFICE VISIT BEFORE ADDTIONAL REFILLS.  #60 x 0   Entered by:   Army Fossa CMA   Authorized by:   Loreen Freud DO   Signed by:   Army Fossa CMA on 04/06/2009   Method used:   Electronically to        [facility]* (retail)       [address]       [address] Shipping/mailing       [place], [place]  [address]       Ph: [phone]       Fax: [phone]   RxID:   [id number]

## 2010-02-01 NOTE — Progress Notes (Signed)
Summary: refill  Phone Note Refill Request Message from:  Fax from Pharmacy on October 07, 2009 2:00 PM  Refills Requested: Medication #1:  ZESTORETIC 20-25 MG TABS tAKE ONE TABLET TWICE DAILY  Medication #2:  ZETIA 10 MG TABS 1 by mouth daily. [facility] - fax [phone]  Initial call taken by: Harlow Mares 2:01 PM  Follow-up for Phone Call        Pt needs to [facility] for labs. Army Fossa CMA  October 07, 2009 4:04 PM ---250.00, 272.4  hgba1c, bmp, lipid, hep   Additional Follow-up for Phone Call Additional follow up Details #1::        appt at [facility] [phone]. Marland KitchenOkey [place]  October 07, 2009 5:02 PM     Prescriptions: ZETIA 10 MG TABS (EZETIMIBE) 1 by mouth daily.  #30 Tablet x 0   Entered by:   Army Fossa CMA   Authorized by:   Loreen Freud DO   Signed by:   Army Fossa CMA on 10/07/2009   Method used:   Electronically to        [name]* (retail)       [address]       [address] Shipping/mailing       [place], [place]  [address]       Ph: [phone]       Fax: [phone]   RxID:   [id number] ZESTORETIC 20-25 MG TABS (LISINOPRIL-HYDROCHLOROTHIAZIDE) tAKE ONE TABLET TWICE DAILY  #180 x 0   Entered by:   Army Fossa CMA   Authorized by:   Loreen Freud DO   Signed by:   Army Fossa CMA on 10/07/2009   Method used:   Electronically to        [name]* (retail)       [address]       [address] Shipping/mailing       [place], [place]  [address]       Ph: [phone]       Fax: [phone]   RxID:   [id number]

## 2010-02-03 NOTE — Letter (Signed)
Summary: Primary Care Appointment Letter  [facility]  [address] [place], [place] [address]   Phone: [phone]  Fax: [phone]    01/25/2010 MRN: [id number]    Virginia Flores [address] [place], [place]  [address]     Dear Virginia Flores,    Your Primary Care Physician Loreen Freud DO has indicated that:    ___X____it is time to schedule an appointment.    _______you missed your appointment on______ and need to call and          reschedule.    _______you need to have lab work done.    _______you need to schedule an appointment discuss lab or test results.    _______you need to call to reschedule your appointment that is                       scheduled on _________.     Please call our office as soon as possible. Our phone number is [phone]. Please press option 1. Our office is open 8a-5p, Monday through Friday.     Thank you,    [facility]

## 2010-03-10 ENCOUNTER — Telehealth (INDEPENDENT_AMBULATORY_CARE_PROVIDER_SITE_OTHER): Payer: Self-pay | Admitting: *Deleted

## 2010-03-14 ENCOUNTER — Telehealth (INDEPENDENT_AMBULATORY_CARE_PROVIDER_SITE_OTHER): Payer: Self-pay | Admitting: *Deleted

## 2010-03-15 ENCOUNTER — Encounter (INDEPENDENT_AMBULATORY_CARE_PROVIDER_SITE_OTHER): Payer: Self-pay | Admitting: *Deleted

## 2010-03-15 NOTE — Progress Notes (Signed)
Summary: refill  Phone Note Refill Request Call back at [phone] cell Message from:  Fax from Pharmacy on March 10, 2010 4:45 PM  Refills Requested: Medication #1:  LABETALOL HCL 200 MG TABS 2 po two times a day. NEEDS OFFICE VISIT BEFORE ADDTIONAL REFILLS.  Medication #2:  CRESTOR 40 MG TABS take 1 by mouth  at bedtime.  Medication #3:  ZETIA 10 MG TABS 1 by mouth daily.*OFFICE VISIT DUE NOW*  Medication #4:  ZESTORETIC 20-25 MG TABS tAKE ONE TABLET TWICE DAILY [facility] - [place] state - [place] - wont be back for 2 months  Initial call taken by: Okey Regal Spring,  March 10, 2010 4:49 PM  Follow-up for Phone Call        pt was supposed to come in 11/11 to review labs and No showed appt---I sent a letter, no upcoming appt. 15 day suppl sent on all meds.Marland KitchenMarland KitchenPt needs and an appt to review previous labs and have more done... CPE is also due now Follow-up by: Almeta Monas CMA Duncan Dull),  March 11, 2010 8:33 AM    Prescriptions: ZETIA 10 MG TABS (EZETIMIBE) 1 by mouth daily.*OFFICE VISIT DUE NOW*  #15 x 0   Entered by:   Almeta Monas CMA (AAMA)   Authorized by:   Loreen Freud DO   Signed by:   Almeta Monas CMA (AAMA) on 03/11/2010   Method used:   Electronically to        [facility] [phone][facility] (retail)       [address]       [place], [place]  [address]       Ph: [phone] or [phone]       Fax: [phone]   RxID:   [id number] CRESTOR 40 MG TABS (ROSUVASTATIN CALCIUM) take 1 by mouth  at bedtime.  #15 x 0   Entered by:   Almeta Monas CMA (AAMA)   Authorized by:   Loreen Freud DO   Signed by:   Almeta Monas CMA (AAMA) on 03/11/2010   Method used:   Electronically [facility]        [facility] [address] [phone][facility] (retail)       [address]       [place], [place]  [address]       Ph: [phone] or [phone]       Fax: [phone]   RxID:   [id number] ZESTORETIC 20-25 MG TABS  (LISINOPRIL-HYDROCHLOROTHIAZIDE) tAKE ONE TABLET TWICE DAILY  #30 x 0   Entered by:   Almeta Monas CMA (AAMA)   Authorized by:   Loreen Freud DO   Signed by:   Almeta Monas CMA (AAMA) on 03/11/2010   Method used:   [facility]        [facility] [address] [phone]* (retail)       [address]       [place], [place]  [address]       Ph: [phone] or [phone]       Fax: [phone]   RxID:   [id number] LABETALOL HCL 200 MG TABS (LABETALOL HCL) 2 po two times a day. NEEDS OFFICE VISIT BEFORE ADDTIONAL REFILLS.  #60 x 0   Entered by:   Almeta Monas CMA (AAMA)   Authorized by:   Loreen Freud DO  Signed by:   Almeta Monas CMA (AAMA) on 03/11/2010   Method used:   [facility]        [facility] [address] [phone]* (retail)       [address]       [place], [place]  [address]       Ph: [phone] or [phone]       Fax: [phone]   RxID:   [id number]

## 2010-03-22 NOTE — Progress Notes (Signed)
Summary: Pt needs an appt for cpe----3/12  Phone Note Outgoing Call   Call placed by: Almeta Monas CMA Duncan Dull),  March 14, 2010 11:21 AM Call placed to: Patient Summary of Call: pt was supposed to come in 11/11 to review labs and No showed appt---I sent a letter, no upcoming appt. 15 day suppl sent on all meds.Virginia KitchenMarland KitchenPt needs and an appt to review previous labs and have more done... CPE is also due now.... Left message to call back to discuss with patient.  Initial call taken by: Almeta Monas CMA Duncan Dull),  March 14, 2010 11:21 AM  Follow-up for Phone Call        I agree---if pt called and not calling back---only 15 day supply ok.   Follow-up by: Loreen Freud DO,  March 14, 2010 11:48 AM  Additional Follow-up for Phone Call Additional follow up Details #1::        Ii tried to contact patient again left mssg and another letter has been mailed. Additional Follow-up by: Almeta Monas CMA Duncan Dull),  March 15, 2010 12:01 PM    Prescriptions: GLUCOPHAGE XR 500 MG XR24H-TAB (METFORMIN HCL) 2 by mouth at bedtime **OFFICE VISIT DUE NOW**  #30 x 0   Entered by:   Almeta Monas CMA (AAMA)   Authorized by:   Loreen Freud DO   Signed by:   Almeta Monas CMA (AAMA) on 03/14/2010   Method used:   Faxed to ...       [facility]* (retail)       [address]       [address] Shipping/mailing       [place], [place]  [address]       Ph: [phone]       Fax: [phone]   RxID:   [id number] ZETIA 10 MG TABS (EZETIMIBE) 1 by mouth daily.*OFFICE VISIT DUE NOW*  #15 x 0   Entered by:   Almeta Monas CMA (AAMA)   Authorized by:   Loreen Freud DO   Signed by:   Almeta Monas CMA (AAMA) on 03/14/2010   Method used:   Faxed to ...       [facility]* (retail)       [address]       [address] Shipping/mailing       [place], [place]  [address]       Ph: [phone]       Fax: [phone]   RxID:   [id number] CRESTOR 40 MG TABS (ROSUVASTATIN  CALCIUM) take 1 by mouth  at bedtime.  #15 x 0   Entered by:   Almeta Monas CMA (AAMA)   Authorized by:   Loreen Freud DO   Signed by:   Almeta Monas CMA (AAMA) on 03/14/2010   Method used:   Faxed to ...       [name]* (retail)       [address]       [address] Shipping/mailing       [place], [place]  [address]       Ph: [phone]       Fax: [phone]   RxID:   [id number] ZESTORETIC 20-25 MG TABS (LISINOPRIL-HYDROCHLOROTHIAZIDE) tAKE ONE TABLET TWICE DAILY  #30 x 0   Entered by:   Almeta Monas CMA (AAMA)   Authorized by:   Loreen Freud DO   Signed by:   Almeta Monas CMA (AAMA) on 03/14/2010  Method used:   Faxed to ...       [facility]* (retail)       [address]       [address] Shipping/mailing       [place], [place]  [address]       Ph: [phone]       Fax: [phone]   RxID:   [id number] LABETALOL HCL 200 MG TABS (LABETALOL HCL) 2 po two times a day. NEEDS OFFICE VISIT BEFORE ADDTIONAL REFILLS.  #60 x 0   Entered by:   Almeta Monas CMA (AAMA)   Authorized by:   Loreen Freud DO   Signed by:   Almeta Monas CMA (AAMA) on 03/14/2010   Method used:   Faxed to ...       [facility]* (retail)       [address]       [address] Shipping/mailing       [place], [place]  [address]       Ph: [phone]       Fax: [phone]   RxID:   [id number]

## 2010-03-22 NOTE — Letter (Signed)
Summary: Generic Letter  [facility] [place]  [address] [place], [place] [address]   Phone: [phone]  Fax: [phone]    03/15/2010    Virginia Flores [address] [place], [place]  [address]     Dear Ms. Artola,     I have tried to contact you in reference to a refill request. In order to continue to provide you with proper medical care, you must make and appointment to see your healthcare provider Dr.Lowne. Our office is open Monday-Friday from 8am until 5pm for available appointments.       Sincerely,   Almeta Monas CMA (AAMA)

## 2010-04-19 LAB — COMPREHENSIVE METABOLIC PANEL WITH GFR
ALT: 17 U/L (ref 0–35)
AST: 16 U/L (ref 0–37)
Albumin: 3.9 g/dL (ref 3.5–5.2)
Alkaline Phosphatase: 77 U/L (ref 39–117)
BUN: 28 mg/dL — ABNORMAL HIGH (ref 6–23)
CO2: 31 meq/L (ref 19–32)
Calcium: 9.8 mg/dL (ref 8.4–10.5)
Chloride: 104 meq/L (ref 96–112)
Creatinine, Ser: 0.98 mg/dL (ref 0.4–1.2)
GFR calc non Af Amer: 58 mL/min — ABNORMAL LOW
Glucose, Bld: 115 mg/dL — ABNORMAL HIGH (ref 70–99)
Potassium: 3.7 meq/L (ref 3.5–5.1)
Sodium: 143 meq/L (ref 135–145)
Total Bilirubin: 0.5 mg/dL (ref 0.3–1.2)
Total Protein: 7 g/dL (ref 6.0–8.3)

## 2010-04-19 LAB — DIFFERENTIAL
Basophils Absolute: 0 10*3/uL (ref 0.0–0.1)
Basophils Relative: 0 % (ref 0–1)
Basophils Relative: 0 % (ref 0–1)
Eosinophils Absolute: 0.1 10*3/uL (ref 0.0–0.7)
Eosinophils Relative: 1 % (ref 0–5)
Eosinophils Relative: 0 % (ref 0–5)
Lymphocytes Relative: 28 % (ref 12–46)
Lymphocytes Relative: 14 % (ref 12–46)
Lymphs Abs: 2.1 10*3/uL (ref 0.7–4.0)
Monocytes Absolute: 0.8 10*3/uL (ref 0.1–1.0)
Monocytes Relative: 10 % (ref 3–12)
Monocytes Relative: 8 % (ref 3–12)
Neutro Abs: 4.6 10*3/uL (ref 1.7–7.7)
Neutro Abs: 8.1 10*3/uL — ABNORMAL HIGH (ref 1.7–7.7)
Neutrophils Relative %: 61 % (ref 43–77)

## 2010-04-19 LAB — GLUCOSE, CAPILLARY: Glucose-Capillary: 152 mg/dL — ABNORMAL HIGH (ref 70–99)

## 2010-04-19 LAB — CBC
HCT: 37.1 % (ref 36.0–46.0)
HCT: 34.5 % — ABNORMAL LOW (ref 36.0–46.0)
Hemoglobin: 11.8 g/dL — ABNORMAL LOW (ref 12.0–15.0)
Hemoglobin: 10.9 g/dL — ABNORMAL LOW (ref 12.0–15.0)
MCHC: 31.8 g/dL (ref 30.0–36.0)
MCHC: 31.7 g/dL (ref 30.0–36.0)
MCV: 71.6 fL — ABNORMAL LOW (ref 78.0–100.0)
MCV: 71.2 fL — ABNORMAL LOW (ref 78.0–100.0)
Platelets: 270 10*3/uL (ref 150–400)
Platelets: 221 10*3/uL (ref 150–400)
RBC: 5.18 MIL/uL — ABNORMAL HIGH (ref 3.87–5.11)
RBC: 4.84 MIL/uL (ref 3.87–5.11)
RDW: 17.9 % — ABNORMAL HIGH (ref 11.5–15.5)
RDW: 18 % — ABNORMAL HIGH (ref 11.5–15.5)
WBC: 7.6 10*3/uL (ref 4.0–10.5)
WBC: 10.4 10*3/uL (ref 4.0–10.5)

## 2010-04-19 LAB — HEMOGLOBIN AND HEMATOCRIT, BLOOD: HCT: 36.7 % (ref 36.0–46.0)

## 2010-05-17 NOTE — Assessment & Plan Note (Signed)
[facility]                            CARDIOLOGY OFFICE NOTE   Virginia Flores, Virginia Flores                       MRN:          [id number]  DATE:06/20/2007                            DOB:          09/03/1950    Virginia Flores is a 60 year old female who I am asked to evaluate  preoperatively prior to gastric banding surgery as well as diastolic  dysfunction.  The patient has no prior cardiac history.  Over the past  year, she has noticed that she has had increased dyspnea on exertion.  However, there is no orthopnea or PND.  She can occasionally have mild  pedal edema.  She also when questioning closely describes an occasional  tightness in her chest with exertion.  However, she attributes this to  her dyspnea and states it is not significant pain.  She does not have  these symptoms at rest.  It is not pleuritic or positional, nor is it  related to food.  It does not radiate.  She did have an echocardiogram  performed on April 15, 2007.  At that time, she was found to have normal  LV function with moderate LVH.  There was diastolic dysfunction and  features consistent with high left ventricular filling pressures.  There  was very mild aortic stenosis with a mean gradient of 12 mmHg and a peak  velocity of 2.04 m/sec.  The left atrium is moderately dilated.  Because  of the above, we were asked to further evaluate.   ALLERGIES:  PENICILLIN.   MEDICATIONS:  1. Labetalol 300 mg p.o. b.i.d.  2. Lipitor 40 mg p.o. daily.  3. Lisinopril 25/12.5 mg p.o. daily.  4. Actoplus 15/850 mg b.i.d.  5. Fish oil.  6. Vitamin D.  7. Aspirin.  8. Red wine.   SOCIAL HISTORY:  The patient does not smoke.  She does drink 4-ounce  glass of wine per evening.   FAMILY HISTORY:  Positive for coronary artery disease.  She has a sister  who has had her bypass at age 71.  She also has a brother who had  myocardial infarction at age 28.  Her father had coronary artery  disease, but  it was at a later age and her mother had an aneurysm.   PAST MEDICAL HISTORY:  Significant for diabetes mellitus for  approximately 10 years.  She also has hypertension and hyperlipidemia.  She has a history of obstructive sleep apnea.  She has osteoarthritis.  She has had a previous hysterectomy.   REVIEW OF SYSTEMS:  She denies any headaches, fevers, or chills.  There  is no productive cough or hemoptysis.  There is no dysphagia,  odynophagia, melena, or hematochezia.  There is no dysuria or hematuria.  There is no rash or seizure activity.  There is no orthopnea or PND, but  there can be pedal edema.  Remaining systems are negative.   PHYSICAL EXAMINATION:  Her blood pressure is 131/67.  Her pulse is 73.  She weighs 322 pounds.  She is well developed and morbidly obese.  She  is in no acute distress  at present.  Her skin is warm and dry.  She does  not appear to be depressed.  There is no peripheral clubbing.  Her back  is normal.  Her HEENT is normal with normal eyelids.  Her neck is supple  with a normal upstroke bilaterally.  There are bilateral carotid bruits.  There is no jugular venous distention.  There is no thyromegaly.  Her  chest is clear to auscultation.  Normal expansion.  Cardiovascular exam  shows regular rate and rhythm.  Normal S1 and S2.  There is a 2/6  systolic murmur heard at the left sternal border.  S2 is not diminished.  The murmur may radiate to the carotids.  There is no S3 or S4.  There is  no rub noted.  Abdominal exam is nontender and nondistended.  Positive  bowel sounds.  No hepatosplenomegaly.  No mass appreciated.  There is no  abdominal bruit.  Note, her abdominal exam was difficult due to her  obesity.  Note, her femoral pulses are difficult to palpate due to her  obesity as well.  I cannot appreciate murmurs.  Her extremities show no  edema and I can palpate no cords.  She has 2+ posterior tibial pulses  bilaterally.  Neurologic exam is grossly  intact.   Her electrocardiogram shows a sinus rhythm at a rate of 72.  There is  left axis deviation.  There are no significant ST changes.   DIAGNOSES:  1. Preoperative evaluation prior to gastric banding surgery - Mrs.      Flores has significant risk factors including diabetes,      hypertension, hyperlipidemia, and positive family history.  She      also has symptoms of dyspnea on exertion and there is a question of      chest tightness with exertion.  We discussed the options today      including catheterization versus Myoview.  She would prefer to      begin with a stress test.  We will schedule her to have a 2-day      rest/stress Myoview.  If it is abnormal, then we will have a low      threshold for cardiac catheterization.  2. Hypertension - her blood pressure is adequately controlled on her      present medications.  3. Dyspnea - some of this certainly could be related to obesity or      hypoventilation syndrome/obstructive sleep apnea.  She also is most      likely deconditioned.  However, there could be a component of      congestive heart failure related to diastolic dysfunction.  I will      check a BNP.  If it is elevated, then we may discontinue      hydrochlorothiazide and instead add Lasix for improved diuresis, to      see if this would improve her dyspnea.  4. Carotid bruits - we will check carotid Dopplers.  Some of that may      be related to mild aortic stenosis with radiation of the murmur to      her carotids.  5. History of aortic stenosis - she will need followup carotid      Dopplers in the future.  6. Diabetes mellitus - managed per her primary care physician.  7. Hyperlipidemia - this is also being managed by her primary care      physician.   We will see her back in approximately 2  weeks to review the above  information.  I think if her Myoview shows no ischemia and her symptoms  are stable, then we could allow her to proceed with her gastric  banding  surgery.      Madolyn Frieze Jens Som, MD, [facility]  Electronically Signed    BSC/MedQ  DD: 06/20/2007  DT: 06/21/2007  Job #: [id number]   cc:   Lelon Perla, DO  Barbaraann Share, MD,FCCP

## 2010-05-17 NOTE — Op Note (Signed)
Virginia Flores, Virginia Flores           ACCOUNT NO.:  [id number]   MEDICAL RECORD NO.:  [id number]          PATIENT TYPE:  OIB   LOCATION:  1529                         FACILITY:  [facility]   PHYSICIAN:  Thornton Park. Daphine Deutscher, MD  DATE OF BIRTH:  [birth year]   DATE OF PROCEDURE:  02/17/2008  DATE OF DISCHARGE:                               OPERATIVE REPORT   PREOPERATIVE DIAGNOSES:  Morbid obesity, BMI 54 with occasional  gastroesophageal reflux disease and hiatal hernia on upper GI.   PROCEDURE:  1. Hiatus hernia repair (two sutures posteriorly).  2. Lap band APS system (Allergan) with subcutaneous port on the right      side.   SURGEON:  Thornton Park. Daphine Deutscher, MD.   ASSISTANTSharlet Salina T. Hoxworth, M.D.   ANESTHESIA:  General endotracheal.   DESCRIPTION OF PROCEDURE:  This is a 60 year old African American lady  brought to OR-1 on February 17, 2008 and given general anesthesia.  The  abdomen was prepped with a Techni-Care equivalent and draped sterilely.  Access to the abdomen was gained through the left upper quadrant using  the OptiVu technique without difficulty.  The abdomen was insufflated  and standard ports placements were used, including a 15 obliquely in the  right upper quadrant.  With liver retractor in place with using the  [facility]. we got good exposure of esophagogastric junction.  There was  a dimple that was readily visible.  We went ahead and  exposed the  esophagogastric junction, and because of her swallow I went ahead and  opened the right side of the crus, and was able to easily visualize the  right and left crus.  With visualization, I then could see the dimple  and then put 2 sutures in posteriorly, which basically obliterated the  dimple.  In addition, we passed the tubing and with it in place, blew up  the balloon and it held up at the GE junction.   Because she did not have a lot of foregut fat, I went ahead and chose an  APS system and went down a little lower on  the right crus at the fat  stripe, and then inserted the band passer and brought it around easily.  I put a suture of silk on the tip of banding tubing, to aid in making it  stationary with a band passer and keeping it from slipping off.  I  pulled it around without difficulty and engaged it in the buckle.  The  tubing was then passed into the stomach again and the band was snapped  together with that in place.  There seemed to be plenty of room.  The  tubing was then removed.  With the band buckle snapped shut, I then  plicated with 3 sutures using Surgitek and tie knots.  The tubing was  brought out and through the lower port on the right, and placed into a  subcutaneous pocket that I had created.  Mesh  was sewn on the back of band port.  The wound was irrigated and all  wounds were closed with 4-0 Vicryl, Benzoin and Steri-Strips.  The  patient tolerated the procedure well and was taken to the recovery room  in satisfactory condition.  She will be kept for overnight observation.      Thornton Park Daphine Deutscher, MD  Electronically Signed     MBM/MEDQ  D:  02/17/2008  T:  02/17/2008  Job:  [id number]   cc:   Madolyn Frieze. Jens Som, MD, [facility]  [address]  [place]  [place] [address]   Barbaraann Share, MD,FCCP  [address]  [place]  [place] [address]

## 2010-05-17 NOTE — Assessment & Plan Note (Signed)
[facility]                            CARDIOLOGY OFFICE NOTE   MAHLI, GLAHN                       MRN:          [id number]  DATE:12/02/2007                            DOB:          Jan 28, 1950    Ms. Principato is a pleasant 60 year old female that I saw in June  preoperatively prior to gastric banding surgery.  At that time, we  scheduled her to have a nuclear study, which was performed on June 26, 2007.  There was no ischemia or infarction, and her ejection fraction  was 70%.  She also had an echocardiogram on April 15, 2007, which showed  normal LV function, very mild aortic stenosis, and moderate left atrial  enlargement.  She also had carotid Dopplers performed that showed a 0-  39% bilateral internal carotid artery stenosis.  Followup was  recommended in 1 year.  Since I last saw her, she does have dyspnea on  exertion, but attributes it to her weight.  Note, she also has a history  of obstructive sleep apnea.  She has not had chest pain, palpitations,  or syncope.   MEDICATIONS:  1. Labetalol 300 mg p.o. b.i.d.  2. Lipitor 40 mg p.o. daily.  3. Lisinopril/HCT 20/12.5 mg p.o. daily.  4. Actos Plus 15/850 b.i.d.  5. Fish oil.  6. Vitamin D.  7. Aspirin.  8. Red wine.   PHYSICAL EXAMINATION:  VITAL SIGNS:  Blood pressure of 169/89 and her  pulse is 81.  She weighs 324 pounds.  HEENT:  Normal.  NECK:  Supple.  CHEST:  Clear.  CARDIOVASCULAR:  Regular rhythm.  S2 is not diminished.  ABDOMEN:  No tenderness.  EXTREMITIES:  No edema.   Her electrocardiogram shows a sinus rhythm at a rate of 72.  There is  left axis deviation.  There are no significant ST changes noted.   DIAGNOSES:  1. Hypertension - Ms. Wahba's blood pressure is mildly elevated.  I      have asked her to increase her labetalol to 400 mg p.o. b.i.d.  2. Cerebrovascular disease - She will continue on her aspirin as well      as her statins.  She will need followup  carotid Dopplers in June.  3. History of aortic stenosis - She will need followup echocardiograms      in the future.  4. Diabetes mellitus.  5. Hyperlipidemia - She will continue on her statin.  This is being      managed by primary care physician.  6. Preoperative evaluation prior to gastric banding surgery - Ms.      Misener's recent Myoview was normal.  She has had no chest pain.  I      think, she can proceed with surgery without further cardiac      evaluation.   We will see her back in 12 months or sooner if necessary.     Madolyn Frieze Jens Som, MD, [facility]  Electronically Signed    BSC/MedQ  DD: 12/02/2007  DT: 12/03/2007  Job #: [id number]

## 2010-05-17 NOTE — Procedures (Signed)
NAME:  Virginia Flores, Virginia Flores NO.:  [id number]   MEDICAL RECORD NO.:  [id number]          PATIENT TYPE:  OUT   LOCATION:  SLEEP CENTER                 [facility]ACILITY[facility]  [facility]   PHYSICIAN:  Barbaraann Share, MD,FCCPDATE OF BIRTH:  1950/09/27   DATE OF STUDY:  05/08/2007                            NOCTURNAL POLYSOMNOGRAM   REFERRING PHYSICIAN:   INDICATION FOR STUDY:  Hypersomnia with sleep apnea.   EPWORTH SLEEPINESS SCORE:  14   MEDICATIONS:   SLEEP ARCHITECTURE:  The patient had a total sleep time of 179 minutes  with decreased slow-wave sleep as well as REM.  Sleep onset latency was  normal at 27 minutes, and REM onset was normal at 72 minutes.  Sleep  efficiency was severely decreased at 50%.   RESPIRATORY DATA:  The patient was found to have 36 apneas and 18  hypopneas for an apnea/hypopnea index of 18 events per hour.  She was  also found to have 30 RERAs, giving her a respiratory disturbance index  of 28 events per hour.  The events were not positional, and there was  very loud snoring noted throughout.   OXYGEN DATA:  There was 02 desaturation as low as 77% with the patient's  obstructive events.   CARDIAC DATA:  No clinically significant arrhythmias were noted.   MOVEMENT-PARASOMNIA:  The patient was found to have 63 leg jerks with  only 1 per hour, resulting in arousal or awakening.   IMPRESSIONS-RECOMMENDATIONS:  Moderate obstructive sleep apnea/hypopnea  syndrome with an apnea/hypopnea index of 18 events per hour and a  respiratory disturbance index of 28 events per hour.  There was O2  desaturation as low as 77%.  Treatment for this degree of sleep apnea  can include weight loss alone if applicable, upper airway surgery, oral  appliance, and also CPAP.  Clinical correlation is suggested.     Barbaraann Share, MD,FCCP  Diplomate, American Board of Sleep  Medicine  Electronically Signed    KMC/MEDQ  D:  05/24/2007 05:52:46  T:  05/24/2007 08:09:15   Job:  [id number]

## 2010-05-17 NOTE — Letter (Signed)
July 24, 2007    [facility]  [address]  [place], [place] [address]   RE:  [name], [name]  MRN:  [id number]  /  DOB:  29-Aug-1950   To whom it may concern,   Virginia Flores is a patient of mine and has come to me with an interest  in gastric bypass surgery.  She states she has been overweight most of  her life and has studied and researched on bariatric surgery.  She  understands the risks and the lifestyle changes that will need to be  made both before, during, and after surgery.  She has attempted many  diets and failed including [facility] in 2004.  She went there for  a year and lost 38 pounds, and regained everything back.  She went to a  dietitian in 1989, for 18 months, lost 32 pounds and regained all of it  back.  She has been to Weight Watcher's 5 or 6 times, lost weight all  those times, between 1989 and 1999, but was unsuccessful in keeping it  off.  She has also tried NutraSweet, Doylene Bode and Atkins in the [facility] and those attempts were unsuccessful as well.   PAST MEDICAL HISTORY:  1. Diabetes type 2.  2. Hypertension.  3. Hyperlipidemia.  4. Osteoarthritis.  5. Most recently diagnosed with diastolic dysfunction.  6. Obstructive sleep apnea.   She has been seen by Dr. Marcelyn Bruins at [facility], and her  obstructive sleep apnea is being treated with CPAP.  She has seen Dr.  Jens Som at [facility], and a stress test was done, which was  negative.  That was done on June 26, 2007.  In 2003, the patient weighed  318 pounds with a BMI of 45.  In 2004, she weighed 308 with a BMI of 40.  In 2005, she weighed 300 pounds with a BMI of 35.  In 2007, she weighed  293.6 with a BMI of 35 and in  2009, to 317 with BMI of 45.  I believe the patient understands the  risks involved with the surgery and would be an excellent candidate for  either the gastric bypass or lap band surgery.   Please feel free to call us with any  further questions.    Sincerely,      Lelon Perla, DO  Electronically Signed    Shawnie Dapper  DD: 07/24/2007  DT: 07/25/2007  Job #: [id number]

## 2010-12-08 ENCOUNTER — Telehealth: Payer: Self-pay | Admitting: Internal Medicine

## 2010-12-08 ENCOUNTER — Ambulatory Visit (INDEPENDENT_AMBULATORY_CARE_PROVIDER_SITE_OTHER): Payer: 59 | Admitting: Internal Medicine

## 2010-12-08 ENCOUNTER — Encounter: Payer: Self-pay | Admitting: Internal Medicine

## 2010-12-08 VITALS — BP 126/80 | HR 72 | Temp 98.0°F | Resp 18 | Ht 63.25 in | Wt 282.0 lb

## 2010-12-08 DIAGNOSIS — E119 Type 2 diabetes mellitus without complications: Secondary | ICD-10-CM

## 2010-12-08 DIAGNOSIS — Z23 Encounter for immunization: Secondary | ICD-10-CM

## 2010-12-08 DIAGNOSIS — E785 Hyperlipidemia, unspecified: Secondary | ICD-10-CM

## 2010-12-08 LAB — BASIC METABOLIC PANEL
BUN: 14 mg/dL (ref 6–23)
CO2: 29 mEq/L (ref 19–32)
Calcium: 9.4 mg/dL (ref 8.4–10.5)
Creat: 0.83 mg/dL (ref 0.50–1.10)
Glucose, Bld: 159 mg/dL — ABNORMAL HIGH (ref 70–99)
Sodium: 140 mEq/L (ref 135–145)

## 2010-12-08 LAB — HEPATIC FUNCTION PANEL
Albumin: 4 g/dL (ref 3.5–5.2)
Total Protein: 6 g/dL (ref 6.0–8.3)

## 2010-12-08 LAB — LIPID PANEL
LDL Cholesterol: 97 mg/dL (ref 0–99)
Triglycerides: 100 mg/dL (ref <150)
VLDL: 20 mg/dL (ref 0–40)

## 2010-12-08 MED ORDER — ROSUVASTATIN CALCIUM 40 MG PO TABS: 40.0000 mg | ORAL_TABLET | Freq: Every day | ORAL | Status: DC

## 2010-12-08 MED ORDER — METFORMIN HCL 1000 MG PO TABS: 1000.0000 mg | ORAL_TABLET | Freq: Two times a day (BID) | ORAL | Status: DC

## 2010-12-08 MED ORDER — LISINOPRIL-HYDROCHLOROTHIAZIDE 20-25 MG PO TABS: 2.0000 | ORAL_TABLET | Freq: Every day | ORAL | Status: DC

## 2010-12-08 MED ORDER — EZETIMIBE 10 MG PO TABS: 10.0000 mg | ORAL_TABLET | Freq: Every day | ORAL | Status: DC

## 2010-12-08 MED ORDER — LABETALOL HCL 300 MG PO TABS: 300.0000 mg | ORAL_TABLET | Freq: Two times a day (BID) | ORAL | Status: DC

## 2010-12-08 NOTE — Patient Instructions (Signed)
Please schedule chem7, a1c 250.0 prior to next visit 

## 2010-12-09 LAB — CBC
HCT: 38.6 % (ref 36.0–46.0)
Hemoglobin: 12.6 g/dL (ref 12.0–15.0)
MCH: 22.5 pg — ABNORMAL LOW (ref 26.0–34.0)
MCV: 69.1 fL — ABNORMAL LOW (ref 78.0–100.0)
RBC: 5.59 MIL/uL — ABNORMAL HIGH (ref 3.87–5.11)

## 2010-12-09 LAB — MICROALBUMIN / CREATININE URINE RATIO
Creatinine, Urine: 575.2 mg/dL
Microalb Creat Ratio: 9.6 mg/g (ref 0.0–30.0)

## 2010-12-09 NOTE — Telephone Encounter (Signed)
Lab orders entered for [facility] for March 2013.

## 2010-12-10 ENCOUNTER — Encounter: Payer: Self-pay | Admitting: Internal Medicine

## 2010-12-10 NOTE — Progress Notes (Signed)
  Subjective:    Patient ID: Virginia Flores, female    DOB: 09/30/1950, 60 y.o.   MRN: [id number]  HPI Pt presents to clinic for follow up of multiple medical problems. Notes fsbs range 120-135 without hypoglycemia. Usually takes metformin 500mg  bid. No recent labs or eye exam. No feet paresthesia. Tolerates statin tx without myalgias.  Past Medical History  Diagnosis Date  . Arthritis   . Diabetes mellitus, type 2   . Hypertension   . Hyperlipidemia    Past Surgical History  Procedure Date  . Abdominal hysterectomy 1989  . Laparoscopic gastric banding 2009  . Tonsillectomy 1979    reports that she has never smoked. She has never used smokeless tobacco. She reports that she drinks alcohol. She reports that she does not use illicit drugs. family history includes Arthritis in an unspecified family member; Diabetes in her mother; Heart disease in her father; Hyperlipidemia in her father and mother; Hypertension in her father, mother, and paternal grandmother; and Stroke in her mother. Allergies  Allergen Reactions  . Penicillins      Review of Systems see hpi     Objective:   Physical Exam  Physical Exam  Nursing note and vitals reviewed. Constitutional: Appears well-developed and well-nourished. No distress.  HENT:  Head: Normocephalic and atraumatic.  Right Ear: External ear normal.  Left Ear: External ear normal.  Eyes: Conjunctivae are normal. No scleral icterus.  Neck: Neck supple. Carotid bruit is not present.  Cardiovascular: Normal rate, regular rhythm and normal heart sounds.  Exam reveals no gallop and no friction rub.   No murmur heard. Pulmonary/Chest: Effort normal and breath sounds normal. No respiratory distress. He has no wheezes. no rales.  Lymphadenopathy:    He has no cervical adenopathy.  Neurological:Alert.  Skin: Skin is warm and dry. Not diaphoretic.  Psychiatric: Has a normal mood and affect.   Diabetic foot exam: +2 DP pulses, no diabetic wounds,  ulcerations or significant callousing. Monofilament exam nl.      Assessment & Plan:

## 2010-12-10 NOTE — Assessment & Plan Note (Signed)
Obtain cbc, chem7, a1c, urine microalbumin. Refer for diabetic eye exam.

## 2010-12-10 NOTE — Assessment & Plan Note (Signed)
Obtain lipid/lft. 

## 2010-12-21 ENCOUNTER — Encounter: Payer: Self-pay | Admitting: *Deleted

## 2010-12-21 NOTE — Telephone Encounter (Signed)
This encounter was created in error - please disregard.

## 2011-03-10 ENCOUNTER — Other Ambulatory Visit (INDEPENDENT_AMBULATORY_CARE_PROVIDER_SITE_OTHER): Payer: 59

## 2011-03-10 DIAGNOSIS — E119 Type 2 diabetes mellitus without complications: Secondary | ICD-10-CM

## 2011-03-10 LAB — BASIC METABOLIC PANEL
CO2: 28 mEq/L (ref 19–32)
GFR: 90.55 mL/min (ref >60.00)
Glucose, Bld: 145 mg/dL — ABNORMAL HIGH (ref 70–99)
Potassium: 4.1 mEq/L (ref 3.5–5.1)
Sodium: 140 mEq/L (ref 135–145)

## 2011-03-16 ENCOUNTER — Ambulatory Visit: Payer: 59 | Admitting: Internal Medicine

## 2011-03-22 ENCOUNTER — Encounter: Payer: Self-pay | Admitting: Internal Medicine

## 2011-03-22 ENCOUNTER — Ambulatory Visit (INDEPENDENT_AMBULATORY_CARE_PROVIDER_SITE_OTHER): Payer: 59 | Admitting: Internal Medicine

## 2011-03-22 VITALS — BP 160/100 | HR 79 | Temp 98.2°F | Resp 20 | Ht 63.25 in | Wt 282.0 lb

## 2011-03-22 DIAGNOSIS — K219 Gastro-esophageal reflux disease without esophagitis: Secondary | ICD-10-CM

## 2011-03-22 DIAGNOSIS — I10 Essential (primary) hypertension: Secondary | ICD-10-CM

## 2011-03-22 DIAGNOSIS — E119 Type 2 diabetes mellitus without complications: Secondary | ICD-10-CM

## 2011-03-22 MED ORDER — LABETALOL HCL 200 MG PO TABS: 400.0000 mg | ORAL_TABLET | Freq: Two times a day (BID) | ORAL | Status: DC

## 2011-03-22 MED ORDER — SAXAGLIPTIN-METFORMIN ER 5-1000 MG PO TB24: 1.0000 | ORAL_TABLET | Freq: Every day | ORAL | Status: DC

## 2011-03-22 MED ORDER — ESOMEPRAZOLE MAGNESIUM 40 MG PO CPDR: 40.0000 mg | DELAYED_RELEASE_CAPSULE | Freq: Every day | ORAL | Status: AC

## 2011-04-01 DIAGNOSIS — K219 Gastro-esophageal reflux disease without esophagitis: Secondary | ICD-10-CM | POA: Insufficient documentation

## 2011-04-01 NOTE — Assessment & Plan Note (Signed)
Begin nexium.

## 2011-04-01 NOTE — Progress Notes (Signed)
  Subjective:    Patient ID: Virginia Flores, female    DOB: Jul 06, 1950, 61 y.o.   MRN: [id number]  HPI Pt presents to clinic for followup of multiple medical problems. BP elevated. States missed several doses of bp medication. C/o nausea with [place].  fsbs peak 170 and a1c 10. Notes increasing regular heartburn sx's without dypshagia or abd pain.   Past Medical History  Diagnosis Date  . Arthritis   . Diabetes mellitus, type 2   . Hypertension   . Hyperlipidemia    Past Surgical History  Procedure Date  . Abdominal hysterectomy 1989  . Laparoscopic gastric banding 2009  . Tonsillectomy 1979    reports that she has never smoked. She has never used smokeless tobacco. She reports that she drinks alcohol. She reports that she does not use illicit drugs. family history includes Arthritis in an unspecified family member; Diabetes in her mother; Heart disease in her father; Hyperlipidemia in her father and mother; Hypertension in her father, mother, and paternal grandmother; and Stroke in her mother. Allergies  Allergen Reactions  . Penicillins       Review of Systems see hpi     Objective:   Physical Exam  Physical Exam  Nursing note and vitals reviewed. Constitutional: Appears well-developed and well-nourished. No distress.  HENT:  Head: Normocephalic and atraumatic.  Right Ear: External ear normal.  Left Ear: External ear normal.  Eyes: Conjunctivae are normal. No scleral icterus.  Neck: Neck supple. Carotid bruit is not present.  Cardiovascular: Normal rate, regular rhythm and normal heart sounds.  Exam reveals no gallop and no friction rub.   No murmur heard. Pulmonary/Chest: Effort normal and breath sounds normal. No respiratory distress. He has no wheezes. no rales.  Lymphadenopathy:    He has no cervical adenopathy.  Neurological:Alert.  Skin: Skin is warm and dry. Not diaphoretic.  Psychiatric: Has a normal mood and affect.        Assessment & Plan:

## 2011-04-01 NOTE — Assessment & Plan Note (Signed)
Change labetolal to 400mg  bid. Monitor bp and report results.

## 2011-04-01 NOTE — Assessment & Plan Note (Signed)
Stop metformin. Begin samples of kombiglyza xr 05/998. Monitor fsbs qd.

## 2011-04-07 ENCOUNTER — Telehealth: Payer: Self-pay | Admitting: Internal Medicine

## 2011-04-07 MED ORDER — EZETIMIBE 10 MG PO TABS: 10.0000 mg | ORAL_TABLET | Freq: Every day | ORAL | Status: DC

## 2011-04-07 NOTE — Telephone Encounter (Signed)
Rx refill sent to pharmacy. 

## 2011-04-10 ENCOUNTER — Telehealth: Payer: Self-pay | Admitting: Internal Medicine

## 2011-04-10 MED ORDER — ROSUVASTATIN CALCIUM 40 MG PO TABS: 40.0000 mg | ORAL_TABLET | Freq: Every day | ORAL | Status: DC

## 2011-04-10 MED ORDER — SAXAGLIPTIN-METFORMIN ER 5-1000 MG PO TB24: 1.0000 | ORAL_TABLET | Freq: Every day | ORAL | Status: DC

## 2011-04-10 NOTE — Telephone Encounter (Signed)
Call placed to patient at [phone], no answer, no voice mail. Call placed to patient at [phone] recorded message reached stating subscriber is not accepting calls at this time. Please try your call again later. Call placed to pharmacy, spoke with Phoebe Sharps, patient did not present printed Rx for Kombigylza. Rx sent electronically to pharmacy.

## 2011-04-26 ENCOUNTER — Encounter: Payer: Self-pay | Admitting: Internal Medicine

## 2011-04-26 ENCOUNTER — Telehealth: Payer: Self-pay | Admitting: Internal Medicine

## 2011-04-26 ENCOUNTER — Ambulatory Visit (INDEPENDENT_AMBULATORY_CARE_PROVIDER_SITE_OTHER): Payer: 59 | Admitting: Internal Medicine

## 2011-04-26 VITALS — BP 134/90 | HR 77 | Temp 98.5°F | Ht 63.25 in | Wt 276.0 lb

## 2011-04-26 DIAGNOSIS — E119 Type 2 diabetes mellitus without complications: Secondary | ICD-10-CM

## 2011-04-26 DIAGNOSIS — E785 Hyperlipidemia, unspecified: Secondary | ICD-10-CM

## 2011-04-26 DIAGNOSIS — I10 Essential (primary) hypertension: Secondary | ICD-10-CM

## 2011-04-26 NOTE — Telephone Encounter (Signed)
Lab order entered for June 2013 @ [facility].

## 2011-04-26 NOTE — Progress Notes (Signed)
  Subjective:    Patient ID: Virginia Flores, female    DOB: [birth year], 61 y.o.   MRN: [id number]  HPI Pt presents to clinic for followup of multiple medical problems. Recently able to tolerate janumet + metformin without nausea. Notes avg fsbs 110-120. S/p diet changes and lost 6 lbs since last visit. No regular exercise. BP improved and home values 130-132/low 80's.   Past Medical History  Diagnosis Date  . Arthritis   . Diabetes mellitus, type 2   . Hypertension   . Hyperlipidemia    Past Surgical History  Procedure Date  . Abdominal hysterectomy 1989  . Laparoscopic gastric banding 2009  . Tonsillectomy 1979    reports that she has never smoked. She has never used smokeless tobacco. She reports that she drinks alcohol. She reports that she does not use illicit drugs. family history includes Arthritis in an unspecified family member; Diabetes in her mother; Heart disease in her father; Hyperlipidemia in her father and mother; Hypertension in her father, mother, and paternal grandmother; and Stroke in her mother. Allergies  Allergen Reactions  . Penicillins       Review of Systems see hpi     Objective:   Physical Exam  Physical Exam  Nursing note and vitals reviewed. Constitutional: Appears well-developed and well-nourished. No distress.  HENT:  Head: Normocephalic and atraumatic.  Right Ear: External ear normal.  Left Ear: External ear normal.  Eyes: Conjunctivae are normal. No scleral icterus.  Neck: Neck supple. Carotid bruit is not present.  Cardiovascular: Normal rate, regular rhythm and normal heart sounds.  Exam reveals no gallop and no friction rub.   No murmur heard. Pulmonary/Chest: Effort normal and breath sounds normal. No respiratory distress. He has no wheezes. no rales.  Lymphadenopathy:    He has no cervical adenopathy.  Neurological:Alert.  Skin: Skin is warm and dry. Not diaphoretic.  Psychiatric: Has a normal mood and affect.          Assessment & Plan:

## 2011-04-26 NOTE — Assessment & Plan Note (Signed)
Stable. Obtain lipid/lft prior to next visit. 

## 2011-04-26 NOTE — Assessment & Plan Note (Signed)
Improved control. Continue current regimen. Focus on regular exercise and wt loss

## 2011-04-26 NOTE — Patient Instructions (Signed)
Please schedule chem7, a1c (250.0) and lipid/lft (272.4) prior to next visit 

## 2011-04-26 NOTE — Assessment & Plan Note (Signed)
Improving control. Continue current regimen. Encouraged to continue dietary change and wt loss. Begin regular exercise program at least 4/week.

## 2011-06-01 ENCOUNTER — Other Ambulatory Visit: Payer: Self-pay | Admitting: Internal Medicine

## 2011-06-01 NOTE — Telephone Encounter (Signed)
Rx refill sent to pharmacy. 

## 2011-06-28 ENCOUNTER — Ambulatory Visit: Payer: 59 | Admitting: Internal Medicine

## 2011-07-19 ENCOUNTER — Ambulatory Visit: Payer: 59 | Admitting: Internal Medicine

## 2011-09-13 ENCOUNTER — Other Ambulatory Visit: Payer: Self-pay | Admitting: Internal Medicine

## 2011-09-13 NOTE — Telephone Encounter (Signed)
Rx done/SLS 

## 2011-09-21 ENCOUNTER — Encounter: Payer: 59 | Attending: Internal Medicine | Admitting: *Deleted

## 2011-09-21 ENCOUNTER — Encounter: Payer: Self-pay | Admitting: *Deleted

## 2011-09-21 VITALS — Ht 65.0 in | Wt 275.6 lb

## 2011-09-21 DIAGNOSIS — Z713 Dietary counseling and surveillance: Secondary | ICD-10-CM | POA: Insufficient documentation

## 2011-09-21 DIAGNOSIS — E119 Type 2 diabetes mellitus without complications: Secondary | ICD-10-CM | POA: Insufficient documentation

## 2011-09-21 NOTE — Patient Instructions (Signed)
Plan: Aim for  Aim for Continue Continue Consider Consider

## 2011-09-21 NOTE — Progress Notes (Signed)
  Medical Nutrition Therapy:  Appt start time: 0900 end time:  1000.  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 Lives with husband. She does all shopping and meal preparation. History of lap band surgery 2 years ago, weight then was 354 pounds. Lost down to 247, then gained about 30 pounds with addition of food. She was referred for Core Class Series but scheduled today for individual appointment. Plan after discussing with patient, I will see her today and she will sign up for Core 2 and Core 3 for rest of Class series.  MEDICATIONS: see list   DIETARY INTAKE:  Usual eating pattern includes 3 meals and 3 snacks per day.  Everyday foods include good variety of all food groups.  Avoided foods include most high fat foods and sweetened beverages.    24-hr recall:  B ( AM): banana, OR oatmeal OR grits, scrambled eggs, bacon or sausage, coffee with cream and Splenda Snk ( AM): trail mix out of the container  L ( PM): eat out 3 times a week at cafeteria: lean, ground meat, starch, veg meal, occasionally bread, unsweet tea Snk ( PM): fresh fruit D ( PM): cooks twice a week, eat out 5 times a week: fish house; fried fish and shrimp, baked potato with small serving butter, slaw, hush puppies,  Snk ( PM): whole bag of popcorn, diet soda Beverages: coffee, unsweet tea, diet soda, a little water with meds  Usual physical activity: walking 30 minutes twice a week either down a long hall at work on weekends  Estimated energy needs: 1400 calories 158 g carbohydrates 105 g protein 39 g fat  Progress Towards Goal(s):  In progress.   Nutritional Diagnosis:  NB-1.1 Food and nutrition-related knowledge deficit As related to poorly controlled diabetes.  As evidenced by A1c of 8.4%.    Intervention:  Nutrition counseling and diabetes.  Handouts given during visit include: Type 2 Diabetes Basics  Book Type 2 Diabetes Basics Food List Carb Counting and Food Label handouts Meal Plan Card  Monitoring/Evaluation:  Dietary intake, exercise, reading food labels, and body weight in 4 weeks with Core 2 and Core 3 Classes.

## 2011-09-22 ENCOUNTER — Telehealth (INDEPENDENT_AMBULATORY_CARE_PROVIDER_SITE_OTHER): Payer: Self-pay | Admitting: Surgery

## 2011-09-22 NOTE — Telephone Encounter (Signed)
I left a voicemail stating the patient should contact [facility] @ [phone] to schedule a follow-up appt...cef °

## 2011-11-07 ENCOUNTER — Telehealth: Payer: Self-pay | Admitting: *Deleted

## 2011-11-07 DIAGNOSIS — E119 Type 2 diabetes mellitus without complications: Secondary | ICD-10-CM

## 2011-11-07 DIAGNOSIS — E785 Hyperlipidemia, unspecified: Secondary | ICD-10-CM

## 2011-11-07 LAB — LIPID PANEL
Cholesterol: 154 mg/dL (ref 0–200)
HDL: 39 mg/dL — ABNORMAL LOW (ref >39)
Total CHOL/HDL Ratio: 3.9 Ratio
Triglycerides: 77 mg/dL (ref <150)

## 2011-11-07 LAB — HEPATIC FUNCTION PANEL
Albumin: 4 g/dL (ref 3.5–5.2)
Bilirubin, Direct: 0.1 mg/dL (ref 0.0–0.3)
Total Bilirubin: 0.5 mg/dL (ref 0.3–1.2)

## 2011-11-07 LAB — BASIC METABOLIC PANEL
Calcium: 9.4 mg/dL (ref 8.4–10.5)
Creat: 1.23 mg/dL — ABNORMAL HIGH (ref 0.50–1.10)
Sodium: 146 mEq/L — ABNORMAL HIGH (ref 135–145)

## 2011-11-07 LAB — HEMOGLOBIN A1C: Mean Plasma Glucose: 169 mg/dL — ABNORMAL HIGH (ref <117)

## 2011-11-07 NOTE — Telephone Encounter (Signed)
The pt presented to the lab, future order entered per last office note. See below:  Please schedule chem7, a1c-250.0 and lipid/lft 272.4 prior to next visit

## 2011-11-13 ENCOUNTER — Encounter: Payer: Self-pay | Admitting: Internal Medicine

## 2011-11-13 ENCOUNTER — Ambulatory Visit (INDEPENDENT_AMBULATORY_CARE_PROVIDER_SITE_OTHER): Payer: 59 | Admitting: Internal Medicine

## 2011-11-13 ENCOUNTER — Encounter (INDEPENDENT_AMBULATORY_CARE_PROVIDER_SITE_OTHER): Payer: 59 | Admitting: *Deleted

## 2011-11-13 VITALS — BP 146/82 | HR 77 | Temp 98.4°F | Resp 16 | Ht 63.25 in | Wt 260.2 lb

## 2011-11-13 DIAGNOSIS — M1990 Unspecified osteoarthritis, unspecified site: Secondary | ICD-10-CM

## 2011-11-13 DIAGNOSIS — E119 Type 2 diabetes mellitus without complications: Secondary | ICD-10-CM

## 2011-11-13 DIAGNOSIS — Z23 Encounter for immunization: Secondary | ICD-10-CM

## 2011-11-13 MED ORDER — DICLOFENAC SODIUM 50 MG PO TBEC: 50.0000 mg | DELAYED_RELEASE_TABLET | Freq: Two times a day (BID) | ORAL | Status: DC | PRN

## 2011-11-13 NOTE — Assessment & Plan Note (Signed)
Encouraged in weight loss attempts. Reviewed approximately 22 pound weight loss over the past 2 visits.

## 2011-11-13 NOTE — Assessment & Plan Note (Signed)
Improving control. Continue current regimen. Encouraged diabetic diet exercise and continued weight loss.

## 2011-11-13 NOTE — Assessment & Plan Note (Signed)
Attempt Voltaren with food and no other anti-inflammatories.

## 2011-11-13 NOTE — Patient Instructions (Signed)
Please schedule fasting labs prior to next visit Chem7, a1c-250.0 and lipid-272.4

## 2011-11-13 NOTE — Progress Notes (Signed)
  Subjective:    Patient ID: Virginia Flores, female    DOB: Mar 29, 1950, 61 y.o.   MRN: [id number]  HPI Pt presents to clinic for followup of multiple medical problems. Reviewed 16 pound weight loss since last visit. Blood pressure mildly elevated however home monitoring normotensive. A1c improved from 10-7.5. Reports fasting blood sugars consistently less than 130 without hypoglycemia. Notes chronic intermittent bilateral knee pain without injury or trauma. Taking no medication for the problem.  Past Medical History  Diagnosis Date  . Arthritis   . Diabetes mellitus, type 2   . Hypertension   . Hyperlipidemia    Past Surgical History  Procedure Date  . Abdominal hysterectomy 1989  . Laparoscopic gastric banding 2009  . Tonsillectomy 1979    reports that she has never smoked. She has never used smokeless tobacco. She reports that she drinks alcohol. She reports that she does not use illicit drugs. family history includes Arthritis in an unspecified family member; Diabetes in her mother; Heart disease in her father; Hyperlipidemia in her father and mother; Hypertension in her father, mother, and paternal grandmother; and Stroke in her mother. Allergies  Allergen Reactions  . Penicillins       Review of Systems see hpi     Objective:   Physical Exam  Physical Exam  Nursing note and vitals reviewed. Constitutional: Appears well-developed and well-nourished. No distress.  HENT:  Head: Normocephalic and atraumatic.  Right Ear: External ear normal.  Left Ear: External ear normal.  Eyes: Conjunctivae are normal. No scleral icterus.  Neck: Neck supple. Carotid bruit is not present.  Cardiovascular: Normal rate, regular rhythm and normal heart sounds.  Exam reveals no gallop and no friction rub.   3/6 SM Pulmonary/Chest: Effort normal and breath sounds normal. No respiratory distress. He has no wheezes. no rales.  Lymphadenopathy:    He has no cervical adenopathy.    Neurological:Alert.  Skin: Skin is warm and dry. Not diaphoretic.  Psychiatric: Has a normal mood and affect.        Assessment & Plan:

## 2011-11-21 ENCOUNTER — Ambulatory Visit (INDEPENDENT_AMBULATORY_CARE_PROVIDER_SITE_OTHER): Payer: 59 | Admitting: Family Medicine

## 2011-11-21 DIAGNOSIS — E119 Type 2 diabetes mellitus without complications: Secondary | ICD-10-CM

## 2011-11-21 NOTE — Progress Notes (Signed)
Patient ID: Virginia Flores, female   DOB: 1950-12-24, 61 y.o.   MRN: [id number] Reviewed: agree with documentation and management.

## 2011-11-21 NOTE — Progress Notes (Signed)
Patient presents today for DM follow-up as part of employer-sponsored [employer]. Medications, glucose readings, a1c and compliance have been reviewed. I have also discussed with patient lifestyle interventions such as diet and exercise. Details of the visit can be found in [facility] documenting program through [facility]). Patient has set a series of personal goals and will follow-up in no more than 3 months for further review of DM.  This visit we focused on staying focused during the holidays, increasing activity levels and the positive impact exercise can have on maintaining healthy blood glucose values.

## 2011-12-04 ENCOUNTER — Other Ambulatory Visit: Payer: Self-pay | Admitting: Internal Medicine

## 2011-12-15 ENCOUNTER — Emergency Department (HOSPITAL_BASED_OUTPATIENT_CLINIC_OR_DEPARTMENT_OTHER)
Admission: EM | Admit: 2011-12-15 | Discharge: 2011-12-15 | Disposition: A | Discharge status: Home / Self Care | Payer: No Typology Code available for payment source | Attending: Emergency Medicine | Admitting: Emergency Medicine

## 2011-12-15 ENCOUNTER — Emergency Department (HOSPITAL_BASED_OUTPATIENT_CLINIC_OR_DEPARTMENT_OTHER): Payer: No Typology Code available for payment source

## 2011-12-15 ENCOUNTER — Encounter (HOSPITAL_BASED_OUTPATIENT_CLINIC_OR_DEPARTMENT_OTHER): Payer: Self-pay

## 2011-12-15 DIAGNOSIS — Y92410 Unspecified street and highway as the place of occurrence of the external cause: Secondary | ICD-10-CM | POA: Insufficient documentation

## 2011-12-15 DIAGNOSIS — Y9389 Activity, other specified: Secondary | ICD-10-CM | POA: Insufficient documentation

## 2011-12-15 DIAGNOSIS — S0993XA Unspecified injury of face, initial encounter: Secondary | ICD-10-CM | POA: Diagnosis present

## 2011-12-15 DIAGNOSIS — Z8739 Personal history of other diseases of the musculoskeletal system and connective tissue: Secondary | ICD-10-CM | POA: Diagnosis not present

## 2011-12-15 DIAGNOSIS — S39012A Strain of muscle, fascia and tendon of lower back, initial encounter: Secondary | ICD-10-CM

## 2011-12-15 DIAGNOSIS — S139XXA Sprain of joints and ligaments of unspecified parts of neck, initial encounter: Secondary | ICD-10-CM | POA: Insufficient documentation

## 2011-12-15 DIAGNOSIS — S335XXA Sprain of ligaments of lumbar spine, initial encounter: Secondary | ICD-10-CM | POA: Insufficient documentation

## 2011-12-15 DIAGNOSIS — S199XXA Unspecified injury of neck, initial encounter: Secondary | ICD-10-CM | POA: Diagnosis present

## 2011-12-15 DIAGNOSIS — E119 Type 2 diabetes mellitus without complications: Secondary | ICD-10-CM | POA: Insufficient documentation

## 2011-12-15 DIAGNOSIS — I10 Essential (primary) hypertension: Secondary | ICD-10-CM | POA: Insufficient documentation

## 2011-12-15 DIAGNOSIS — E785 Hyperlipidemia, unspecified: Secondary | ICD-10-CM | POA: Insufficient documentation

## 2011-12-15 DIAGNOSIS — Z79899 Other long term (current) drug therapy: Secondary | ICD-10-CM | POA: Insufficient documentation

## 2011-12-15 DIAGNOSIS — S161XXA Strain of muscle, fascia and tendon at neck level, initial encounter: Secondary | ICD-10-CM

## 2011-12-15 MED ORDER — NAPROXEN 250 MG PO TABS
500.0000 mg | ORAL_TABLET | Freq: Once | ORAL | Status: DC
  Filled 2011-12-15: qty 2

## 2011-12-15 MED ORDER — DICLOFENAC SODIUM 75 MG PO TBEC: 75.0000 mg | DELAYED_RELEASE_TABLET | Freq: Two times a day (BID) | ORAL | Status: DC | PRN
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
## 2011-12-15 NOTE — ED Notes (Signed)
MD at bedside. 

## 2011-12-15 NOTE — ED Notes (Signed)
Spoke with pt at length r/t hard c collar placement-pt refused

## 2011-12-15 NOTE — ED Notes (Signed)
Patient transported to X-ray 
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
## 2011-12-15 NOTE — ED Notes (Signed)
Pt refused naproxen, md made aware

## 2012-01-02 NOTE — ED Notes (Signed)
Pt returned to ED today for RTW note-chart reviewed by this RN and Misty Stanley Toben,RN-pt given RTW 12/15 which is for 2 days after ED visit-pt advised S Toben RN that she has been seen by neurologist-was advised that PCP or Neuro would need to possible file FMLA or release her for work

## 2012-01-06 ENCOUNTER — Emergency Department (HOSPITAL_BASED_OUTPATIENT_CLINIC_OR_DEPARTMENT_OTHER)
Admission: EM | Admit: 2012-01-06 | Discharge: 2012-01-06 | Disposition: A | Discharge status: Home / Self Care | Payer: No Typology Code available for payment source | Attending: Emergency Medicine | Admitting: Emergency Medicine

## 2012-01-06 ENCOUNTER — Encounter (HOSPITAL_BASED_OUTPATIENT_CLINIC_OR_DEPARTMENT_OTHER): Payer: Self-pay | Admitting: *Deleted

## 2012-01-06 DIAGNOSIS — Z79899 Other long term (current) drug therapy: Secondary | ICD-10-CM | POA: Insufficient documentation

## 2012-01-06 DIAGNOSIS — I10 Essential (primary) hypertension: Secondary | ICD-10-CM | POA: Insufficient documentation

## 2012-01-06 DIAGNOSIS — Z8739 Personal history of other diseases of the musculoskeletal system and connective tissue: Secondary | ICD-10-CM | POA: Insufficient documentation

## 2012-01-06 DIAGNOSIS — Z760 Encounter for issue of repeat prescription: Secondary | ICD-10-CM

## 2012-01-06 DIAGNOSIS — IMO0001 Reserved for inherently not codable concepts without codable children: Secondary | ICD-10-CM | POA: Insufficient documentation

## 2012-01-06 DIAGNOSIS — E785 Hyperlipidemia, unspecified: Secondary | ICD-10-CM | POA: Insufficient documentation

## 2012-01-06 MED ORDER — DICLOFENAC SODIUM 75 MG PO TBEC: 75.0000 mg | DELAYED_RELEASE_TABLET | Freq: Two times a day (BID) | ORAL | Status: DC

## 2012-01-06 NOTE — ED Provider Notes (Signed)
Medical screening examination/treatment/procedure(s) were performed by non-physician practitioner and as supervising physician I was immediately available for consultation/collaboration.   Manternach Jabree Rebert, MD 01/06/12 2345 
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
## 2012-01-06 NOTE — ED Notes (Signed)
Pt request refill on pain meds

## 2012-01-09 ENCOUNTER — Other Ambulatory Visit (HOSPITAL_COMMUNITY): Payer: Self-pay | Admitting: Neurosurgery

## 2012-01-09 DIAGNOSIS — M542 Cervicalgia: Secondary | ICD-10-CM

## 2012-01-09 DIAGNOSIS — M792 Neuralgia and neuritis, unspecified: Secondary | ICD-10-CM

## 2012-01-10 ENCOUNTER — Ambulatory Visit (HOSPITAL_COMMUNITY)
Admission: RE | Admit: 2012-01-10 | Discharge: 2012-01-10 | Disposition: A | Discharge status: Home / Self Care | Payer: 59 | Source: Ambulatory Visit | Attending: Neurosurgery | Admitting: Neurosurgery

## 2012-01-10 DIAGNOSIS — M5020 Other cervical disc displacement, unspecified cervical region: Secondary | ICD-10-CM | POA: Insufficient documentation

## 2012-01-10 DIAGNOSIS — M792 Neuralgia and neuritis, unspecified: Secondary | ICD-10-CM

## 2012-01-10 DIAGNOSIS — M47812 Spondylosis without myelopathy or radiculopathy, cervical region: Secondary | ICD-10-CM | POA: Insufficient documentation

## 2012-01-10 DIAGNOSIS — M542 Cervicalgia: Secondary | ICD-10-CM | POA: Insufficient documentation

## 2012-01-10 DIAGNOSIS — R209 Unspecified disturbances of skin sensation: Secondary | ICD-10-CM | POA: Insufficient documentation

## 2012-01-11 ENCOUNTER — Encounter: Payer: 59 | Attending: Internal Medicine | Admitting: Dietician

## 2012-01-11 DIAGNOSIS — Z713 Dietary counseling and surveillance: Secondary | ICD-10-CM | POA: Insufficient documentation

## 2012-01-11 DIAGNOSIS — E119 Type 2 diabetes mellitus without complications: Secondary | ICD-10-CM | POA: Insufficient documentation

## 2012-01-11 NOTE — Progress Notes (Signed)
  Patient was seen on 01/11/2012 for the second of a series of three diabetes self-management courses at the [facility]. The following learning objectives were met by the patient during this course:   Explain basic nutrition maintenance and quality assurance  Describe causes, symptoms and treatment of hypoglycemia and hyperglycemia  Explain how to manage diabetes during illness  Describe the importance of good nutrition for health and healthy eating strategies  List strategies to follow meal plan when dining out  Describe the effects of alcohol on glucose and how to use it safely  Describe problem solving skills for day-to-day glucose challenges  Describe strategies to use when treatment plan needs to change  Identify important factors involved in successful weight loss  Describe ways to remain physically active  Describe the impact of regular activity on insulin resistance  Patient updated their initials goals from Core Class I to include:  Handouts given in class:  Refrigerator magnet for Sick Day Guidelines  [facility] Oral medication/insulin handout  Weight Loss Strategies  Follow-Up Plan: Patient will attend the final class of the ADA Diabetes Self-Care Education.

## 2012-01-11 NOTE — Progress Notes (Deleted)
  Patient was seen on *** for their 3 month follow-up as a part of the diabetes self-management courses at the [facility]. The following learning objectives were met by your patient during this course:  Patient self reports the following:  Diabetes control has improved since diabetes self-management training: *** Number of days blood glucose is >200: *** Last MD appointment for diabetes: *** Changes in treatment plan: *** Confidence with ability to manage diabetes: *** Areas for improvement with diabetes self-care: *** Willingness to participate in diabetes support group: ***  Please see Diabetes Flow sheet for findings related to patient's self-care.  Follow-Up Plan: Patient is eligible for a "free" 30 minute diabetes self-care appointment in the next year. Patient to call and schedule as needed.  

## 2012-01-17 ENCOUNTER — Other Ambulatory Visit: Payer: Self-pay | Admitting: Internal Medicine

## 2012-01-17 NOTE — Telephone Encounter (Signed)
Kombiglye 05-998 mg request [Last Rx in EMR 04.08.13 #30x6] Voltaren 75 mg BID request [Given by ED only 12.13.13 #60 & 01.04.14 #18 Pt has appt scheduled 02.12.14/SLS Please advise.

## 2012-01-18 ENCOUNTER — Telehealth: Payer: Self-pay | Admitting: *Deleted

## 2012-01-18 NOTE — Telephone Encounter (Signed)
Ok 60m for dm med. 1 month for voltaren

## 2012-01-18 NOTE — Telephone Encounter (Signed)
Received notification that pt's Crestor will no longer be $0 co-pay, suggested alternatives denied by provider d/t cholesterol management not under control. LMOM with contact name & number to inform patient of this information & to inform that We have samples of her Crestor available for p/u at front office & that this matter can further be discussed at upcoming OV/SLS

## 2012-01-18 NOTE — Telephone Encounter (Signed)
Rx[s] to pharmacy/SLS 

## 2012-01-25 ENCOUNTER — Encounter: Payer: 59 | Admitting: *Deleted

## 2012-01-25 DIAGNOSIS — E119 Type 2 diabetes mellitus without complications: Secondary | ICD-10-CM

## 2012-01-25 NOTE — Progress Notes (Signed)
  Patient was seen on 01/25/12 for the third of a series of three diabetes self-management courses at the [facility]. The following learning objectives were met by the patient during this course:    Describe how diabetes changes over time   Identify diabetes complications and ways to prevent them   Describe strategies that can promote heart health including lowering blood pressure and cholesterol   Describe strategies to lower dietary fat and sodium in the diet   Identify physical activities that benefit cardiovascular health   Evaluate success in meeting personal goal   Describe the belief that they can live successfully with diabetes day to day   Establish 2-3 goals that they will plan to diligently work on until they return for the free 69-month follow-up visit  The following handouts were given in class:  3 Month Follow Up Visit handout  Goal setting handout  Class evaluation form  Your patient has established the following 3 month goals for diabetes self-care:  Take diabetes medications as scheduled  Look for patterns in my record book at least 1 a month  Test glucose BID 5 days a week  Follow-Up Plan: Patient will attend a 3 month follow-up visit for diabetes self-management education.

## 2012-01-25 NOTE — Patient Instructions (Signed)
Goals:  Follow Diabetes Meal Plan as instructed  Eat 3 meals and 2 snacks, every 3-5 hrs  Limit carbohydrate intake to 30-45 grams carbohydrate/meal  Limit carbohydrate intake to 15 grams carbohydrate/snack  Add lean protein foods to meals/snacks  Monitor glucose levels as instructed by your doctor  Aim for 30 mins of physical activity daily  Bring food record and glucose log to your next nutrition visit30-45

## 2012-02-14 ENCOUNTER — Ambulatory Visit: Payer: 59 | Admitting: Family

## 2012-03-04 ENCOUNTER — Other Ambulatory Visit: Payer: Self-pay | Admitting: Internal Medicine

## 2012-03-05 NOTE — Telephone Encounter (Signed)
Informed patient of medication refill and she scheduled appointment for 03/15/12

## 2012-03-05 NOTE — Telephone Encounter (Signed)
Refills sent for 30 day supply. Pt cancelled appt on 02/14/12 with Melissa and has not future appt on file. Pt is due for follow up. Please call pt to arrange appt.

## 2012-03-15 ENCOUNTER — Ambulatory Visit: Payer: 59 | Admitting: Family

## 2012-03-20 ENCOUNTER — Ambulatory Visit (INDEPENDENT_AMBULATORY_CARE_PROVIDER_SITE_OTHER): Payer: 59 | Admitting: Family

## 2012-03-20 ENCOUNTER — Encounter: Payer: Self-pay | Admitting: Family

## 2012-03-20 VITALS — BP 144/94 | HR 67 | Temp 98.0°F | Resp 16 | Ht 63.25 in | Wt 263.1 lb

## 2012-03-20 DIAGNOSIS — E785 Hyperlipidemia, unspecified: Secondary | ICD-10-CM

## 2012-03-20 DIAGNOSIS — I10 Essential (primary) hypertension: Secondary | ICD-10-CM

## 2012-03-20 DIAGNOSIS — E119 Type 2 diabetes mellitus without complications: Secondary | ICD-10-CM

## 2012-03-20 DIAGNOSIS — IMO0001 Reserved for inherently not codable concepts without codable children: Secondary | ICD-10-CM

## 2012-03-20 DIAGNOSIS — M7918 Myalgia, other site: Secondary | ICD-10-CM

## 2012-03-20 LAB — HEMOGLOBIN A1C: Mean Plasma Glucose: 148 mg/dL — ABNORMAL HIGH (ref <117)

## 2012-03-20 MED ORDER — ROSUVASTATIN CALCIUM 40 MG PO TABS: 40.0000 mg | ORAL_TABLET | Freq: Every day | ORAL | Status: DC

## 2012-03-20 MED ORDER — DICLOFENAC SODIUM 75 MG PO TBEC: 75.0000 mg | DELAYED_RELEASE_TABLET | Freq: Two times a day (BID) | ORAL | Status: DC

## 2012-03-20 MED ORDER — EZETIMIBE 10 MG PO TABS: 10.0000 mg | ORAL_TABLET | Freq: Every day | ORAL | Status: DC

## 2012-03-20 NOTE — Patient Instructions (Addendum)
Please complete your lab work prior to leaving. Follow up in 3 months.   
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
## 2012-03-21 DIAGNOSIS — M7918 Myalgia, other site: Secondary | ICD-10-CM | POA: Insufficient documentation

## 2012-03-21 HISTORY — DX: Myalgia, other site: M79.18

## 2012-03-21 LAB — HEPATIC FUNCTION PANEL
ALT: 12 U/L (ref 0–35)
Bilirubin, Direct: 0.1 mg/dL (ref 0.0–0.3)
Total Bilirubin: 0.4 mg/dL (ref 0.3–1.2)
Total Protein: 6.1 g/dL (ref 6.0–8.3)

## 2012-03-21 LAB — BASIC METABOLIC PANEL WITH GFR
BUN: 23 mg/dL (ref 6–23)
CO2: 29 mEq/L (ref 19–32)
GFR, Est African American: 73 mL/min
Glucose, Bld: 103 mg/dL — ABNORMAL HIGH (ref 70–99)
Potassium: 4.2 mEq/L (ref 3.5–5.3)
Sodium: 144 mEq/L (ref 135–145)

## 2012-03-21 LAB — MICROALBUMIN / CREATININE URINE RATIO
Creatinine, Urine: 284.1 mg/dL
Microalb, Ur: 1.62 mg/dL (ref 0.00–1.89)

## 2012-03-21 NOTE — Assessment & Plan Note (Signed)
Clinically stable, obtain a1c, urine microalb.cont kombiglyze

## 2012-03-21 NOTE — Assessment & Plan Note (Signed)
BP Readings from Last 3 Encounters:  03/20/12 144/94  01/06/12 174/82  12/15/11 157/75  fair BP control.

## 2012-03-21 NOTE — Assessment & Plan Note (Signed)
Clinically stable on statin. ldl 100.

## 2012-03-27 ENCOUNTER — Encounter: Payer: Self-pay | Admitting: Family

## 2012-04-01 ENCOUNTER — Telehealth: Payer: Self-pay | Admitting: Family

## 2012-04-01 DIAGNOSIS — R200 Anesthesia of skin: Secondary | ICD-10-CM

## 2012-04-01 NOTE — Telephone Encounter (Signed)
Patient unavailable; left message w/contact to have pt return call RE: medication management per provider/SLS

## 2012-04-01 NOTE — Telephone Encounter (Signed)
Pls call pt and let her know that I reviewed her medication dosages.   Our records show that she is on Kombiglyze and metformin.  Is she taking these both, she should stop metformin as her dose of metformin between the two is too high.  If she is in fact taking both, then I will send januvia in place of the metformin.

## 2012-04-03 NOTE — Telephone Encounter (Signed)
Unable to leave message on home and cell #s.

## 2012-04-04 NOTE — Telephone Encounter (Signed)
Left message on home # to return my call. 

## 2012-04-09 NOTE — Telephone Encounter (Signed)
Notified pt and she voices understanding. Requests Januvia be sent to [facility] pharmacy.

## 2012-04-09 NOTE — Telephone Encounter (Signed)
Spoke with pt. She reports that she is only taking 1000mg  of metformin HS.  Taking the kombiglyze 05/998 in the AM. Will continue same. Pt aware. She is requesting referral to sports medicine for ongoing right arm numbness.

## 2012-04-16 ENCOUNTER — Ambulatory Visit: Payer: 59 | Admitting: Family Medicine

## 2012-05-16 ENCOUNTER — Other Ambulatory Visit: Payer: Self-pay | Admitting: Family

## 2012-05-16 ENCOUNTER — Other Ambulatory Visit: Payer: Self-pay | Admitting: Internal Medicine

## 2012-05-17 NOTE — Telephone Encounter (Signed)
Rx request to pharmacy/SLS  

## 2012-05-22 ENCOUNTER — Ambulatory Visit: Payer: 59 | Admitting: *Deleted

## 2012-05-22 ENCOUNTER — Telehealth: Payer: Self-pay | Admitting: *Deleted

## 2012-05-22 NOTE — Telephone Encounter (Signed)
Received call from pt requesting refill on medications as she will not have insurance coverage after the end of this month due to losing her job. Upon review of record I see that medications have already been refilled. Notified pt and asked for clarification on metformin directions. Pt confirmed documentation of 03/22/12 phone note (1000mg  at bedtime). Pt states she takes Kombiglyze XR every morning. Spoke with Romeo Apple at [facility] and changed metformin directions to 1000mg  at bedtime. He states that pt has refill on file of Kombiglyze XR from January and will fill that Rx. Med list updated.

## 2012-06-26 ENCOUNTER — Ambulatory Visit: Payer: 59 | Admitting: Family

## 2012-06-26 DIAGNOSIS — Z0289 Encounter for other administrative examinations: Secondary | ICD-10-CM

## 2012-07-11 ENCOUNTER — Other Ambulatory Visit: Payer: Self-pay

## 2012-09-17 ENCOUNTER — Encounter: Payer: Self-pay | Admitting: Internal Medicine

## 2012-10-10 ENCOUNTER — Other Ambulatory Visit: Payer: Self-pay | Admitting: Family

## 2012-10-11 ENCOUNTER — Other Ambulatory Visit: Payer: Self-pay | Admitting: *Deleted

## 2012-10-11 ENCOUNTER — Other Ambulatory Visit: Payer: Self-pay | Admitting: Family

## 2012-10-11 DIAGNOSIS — I10 Essential (primary) hypertension: Secondary | ICD-10-CM

## 2012-10-11 MED ORDER — LABETALOL HCL 200 MG PO TABS: ORAL_TABLET | ORAL | Status: DC

## 2012-10-11 NOTE — Telephone Encounter (Signed)
Refill for 30 day supply sent to [facility] with notation that pt needs OV prior to additional refills

## 2012-10-14 ENCOUNTER — Telehealth: Payer: Self-pay | Admitting: *Deleted

## 2012-10-14 NOTE — Telephone Encounter (Signed)
OK to give sample of kombilyze zetia if we have them.  Other meds are generic, we do not have samples.  OK to send 30 day supply with one refill on other meds. I would like to see her back in the office the first week in December.

## 2012-10-14 NOTE — Telephone Encounter (Signed)
Received message from pt that she will be medicare eligible in December. Until that time she has no insurance and requests samples of metformin, lisinopriol or labetalol. Spoke with pt and advised her that we do not get samples of these medications. Pt states she is not able to continue kombiglyze due to cost.   Advised pt of need for appt / labs. Pt states she is not able to come in until December once her medicare is in effect. Please advise.

## 2012-10-14 NOTE — Telephone Encounter (Signed)
Received call from pharmacy stating pt requested a refill of her metformin and told them she is taking it twice a day. Pharmacy wanted to verify directions. Upon review of EPIC it appears that pt lost her job in May. Pt was instructed at that time to take metformin once a day as she was also on Kombiglyze. Pharmacy states they will notify pt and advise her to contact us for appt and further medication instructions.

## 2012-10-14 NOTE — Telephone Encounter (Signed)
Left message for pt to return my call. We have 3 boxes of zetia to give pt, 6 packs of crestor 20mg  for pt to take 2 a day to = 40mg . We only have 1 pack of 5 tables of kombiglyze. Is there anything else we could substitute?

## 2012-10-15 NOTE — Telephone Encounter (Signed)
I am not comfortable making any further changes without seeing her back in the office.  She can check back with [place] to see if we have any additional kombiglyze.

## 2012-10-15 NOTE — Telephone Encounter (Signed)
Attempted to reach pt, fast busy tone on cell# and memory full on home #. Will try again tomorrow.

## 2012-10-16 NOTE — Telephone Encounter (Signed)
Spoke with pharmacy re: additional contact # for pt. They have pt's cell# as [phone]. Unable to leave message at that # as mailbox is full.

## 2012-10-16 NOTE — Telephone Encounter (Signed)
Not able to leave message on cell (fast busy tone), memory full on home #.

## 2012-10-18 NOTE — Telephone Encounter (Signed)
Unable to leave message at home or cell # as mailbox is full.

## 2012-10-25 ENCOUNTER — Ambulatory Visit (HOSPITAL_BASED_OUTPATIENT_CLINIC_OR_DEPARTMENT_OTHER)
Admission: RE | Admit: 2012-10-25 | Discharge: 2012-10-25 | Disposition: A | Discharge status: Home / Self Care | Payer: No Typology Code available for payment source | Source: Ambulatory Visit | Attending: Family Medicine | Admitting: Family Medicine

## 2012-10-25 ENCOUNTER — Ambulatory Visit (INDEPENDENT_AMBULATORY_CARE_PROVIDER_SITE_OTHER): Payer: Self-pay | Admitting: Family Medicine

## 2012-10-25 ENCOUNTER — Encounter: Payer: Self-pay | Admitting: Family Medicine

## 2012-10-25 VITALS — BP 158/87 | HR 70 | Ht 64.0 in | Wt 269.0 lb

## 2012-10-25 DIAGNOSIS — M25469 Effusion, unspecified knee: Secondary | ICD-10-CM | POA: Insufficient documentation

## 2012-10-25 DIAGNOSIS — M25562 Pain in left knee: Secondary | ICD-10-CM

## 2012-10-25 DIAGNOSIS — M1710 Unilateral primary osteoarthritis, unspecified knee: Secondary | ICD-10-CM | POA: Insufficient documentation

## 2012-10-25 DIAGNOSIS — IMO0002 Reserved for concepts with insufficient information to code with codable children: Secondary | ICD-10-CM | POA: Insufficient documentation

## 2012-10-25 DIAGNOSIS — M25569 Pain in unspecified knee: Secondary | ICD-10-CM | POA: Insufficient documentation

## 2012-10-25 NOTE — Patient Instructions (Signed)
Take tylenol 500mg  1-2 tabs three times a day for pain. Aleve 1-2 tabs twice a day with food Glucosamine sulfate 750mg  twice a day is a supplement that may help. Capsaicin topically up to four times a day may also help with pain. Cortisone injections are an option - you were given one today. If cortisone injections do not help, there are different types of shots that may help but they take longer to take effect. It's important that you continue to stay active. If you are overweight, try to lose weight through diet and exercise. Straight leg raises, knee extensions 3 sets of 10 once a day (add ankle weight if these become too easy). Consider physical therapy to strengthen muscles around the joint that hurts to take pressure off of the joint itself. Shoe inserts with good arch support may be helpful. Walker or cane if needed. Heat or ice 15 minutes at a time 3-4 times a day as needed to help with pain. Water aerobics and cycling with low resistance are the best two types of exercise for arthritis. Follow up with me in 1 month or as needed.

## 2012-10-29 ENCOUNTER — Encounter: Payer: Self-pay | Admitting: Family Medicine

## 2012-10-30 ENCOUNTER — Encounter: Payer: Self-pay | Admitting: Family Medicine

## 2012-10-30 DIAGNOSIS — M25561 Pain in right knee: Secondary | ICD-10-CM | POA: Insufficient documentation

## 2012-10-30 HISTORY — DX: Pain in left knee: M25.561
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
## 2012-11-21 ENCOUNTER — Other Ambulatory Visit: Payer: Self-pay | Admitting: Family

## 2012-11-21 NOTE — Telephone Encounter (Signed)
30 day supply lisinopril hct sent to pharmacy. Pt was due for follow up of blood pressure and diabetes in June and is past due. Please call pt to arrange appt before 30 day supply runs out.

## 2012-11-22 NOTE — Telephone Encounter (Signed)
Informed patient of 30 day medication refill and she states that her insurance does not become effective until January/2015. She states that she cannot schedule appointment until she knows what day in January her insurance will become effective.

## 2012-12-13 ENCOUNTER — Encounter: Payer: Self-pay | Admitting: Family

## 2012-12-13 ENCOUNTER — Ambulatory Visit (INDEPENDENT_AMBULATORY_CARE_PROVIDER_SITE_OTHER): Payer: 59 | Admitting: Family

## 2012-12-13 VITALS — BP 140/86 | HR 69 | Temp 98.6°F | Resp 16 | Ht 63.25 in | Wt 284.0 lb

## 2012-12-13 DIAGNOSIS — M1990 Unspecified osteoarthritis, unspecified site: Secondary | ICD-10-CM

## 2012-12-13 DIAGNOSIS — E785 Hyperlipidemia, unspecified: Secondary | ICD-10-CM

## 2012-12-13 DIAGNOSIS — I359 Nonrheumatic aortic valve disorder, unspecified: Secondary | ICD-10-CM

## 2012-12-13 DIAGNOSIS — E119 Type 2 diabetes mellitus without complications: Secondary | ICD-10-CM

## 2012-12-13 LAB — BASIC METABOLIC PANEL
BUN: 23 mg/dL (ref 6–23)
Calcium: 9.3 mg/dL (ref 8.4–10.5)
Creat: 0.93 mg/dL (ref 0.50–1.10)
Glucose, Bld: 141 mg/dL — ABNORMAL HIGH (ref 70–99)
Sodium: 143 mEq/L (ref 135–145)

## 2012-12-13 MED ORDER — LISINOPRIL-HYDROCHLOROTHIAZIDE 20-25 MG PO TABS: ORAL_TABLET | ORAL | Status: DC

## 2012-12-13 MED ORDER — EZETIMIBE 10 MG PO TABS: 10.0000 mg | ORAL_TABLET | Freq: Every day | ORAL | Status: DC

## 2012-12-13 MED ORDER — METFORMIN HCL 1000 MG PO TABS: 1000.0000 mg | ORAL_TABLET | Freq: Two times a day (BID) | ORAL | Status: DC

## 2012-12-13 MED ORDER — LABETALOL HCL 200 MG PO TABS: ORAL_TABLET | ORAL | Status: DC

## 2012-12-13 MED ORDER — DICLOFENAC SODIUM 75 MG PO TBEC: DELAYED_RELEASE_TABLET | ORAL | Status: DC

## 2012-12-13 MED ORDER — SAXAGLIPTIN-METFORMIN ER 5-1000 MG PO TB24: 1.0000 | ORAL_TABLET | Freq: Every day | ORAL | Status: DC

## 2012-12-13 MED ORDER — ROSUVASTATIN CALCIUM 40 MG PO TABS: 40.0000 mg | ORAL_TABLET | Freq: Every day | ORAL | Status: DC

## 2012-12-13 NOTE — Progress Notes (Signed)
Subjective:    Patient ID: Virginia Flores, female    DOB: 18-Oct-1950, 62 y.o.   MRN: [id number]  HPI  Virginia Flores is a 62 yr old female who presents today for follow up of multiple medical problems. She was last seen in March 2014.  She has been unemployed and has been causing her a lot of stress.  Has had some weight gain and knee pain.  Seeing Dr. Pearletha Forge who performed a cortisone injection.    1) HTN- Current blood pressure medications include:  Labetalol, lisinopril-hctz. BP Readings from Last 3 Encounters:  12/13/12 140/86  10/25/12 158/87  03/20/12 144/94    2) Hyperlipidemia- maintained on crestor.  3) DM2-  Maintained on kombiglyze xr.   Lab Results  Component Value Date   HGBA1C 6.8* 03/20/2012   4) OSA- has been evaluated by Dr. Shelle Iron  5) AS/Chronic diastolic dysfunction- has followed with Dr. Jens Som in the past, but has not seen him since 2011.   6) OA- notes that voltaran helped.    Review of Systems See HPI  Past Medical History  Diagnosis Date  . Arthritis   . Diabetes mellitus, type 2   . Hypertension   . Hyperlipidemia     History   Social History  . Marital Status: Married    Spouse Name: N/A    Number of Children: N/A  . Years of Education: N/A   Occupational History  . Not on file.   Social History Main Topics  . Smoking status: Never Smoker   . Smokeless tobacco: Never Used  . Alcohol Use: Yes  . Drug Use: No  . Sexual Activity: Not on file   Other Topics Concern  . Not on file   Social History Narrative  . No narrative on file    Past Surgical History  Procedure Laterality Date  . Abdominal hysterectomy  1989  . Laparoscopic gastric banding  2009  . Tonsillectomy  1979    Family History  Problem Relation Age of Onset  . Arthritis      paternal grandparents  . Hyperlipidemia Mother   . Stroke Mother     maternal grandmother  . Hypertension Mother   . Diabetes Mother   . Hyperlipidemia Father     maternal/paternal  grandparents  . Heart disease Father   . Hypertension Father   . Hypertension Paternal Grandmother     No Known Allergies  Current Outpatient Prescriptions on File Prior to Visit  Medication Sig Dispense Refill  . ezetimibe (ZETIA) 10 MG tablet Take 1 tablet (10 mg total) by mouth daily.  30 tablet  6  . labetalol (NORMODYNE) 200 MG tablet TAKE 2 TABLETS (400 MG TOTAL) BY MOUTH 2 (TWO) TIMES DAILY.  120 tablet  3  . lisinopril-hydrochlorothiazide (PRINZIDE,ZESTORETIC) 20-25 MG per tablet TAKE 2 TABLETS BY MOUTH DAILY.  60 tablet  0  . metFORMIN (GLUCOPHAGE) 1000 MG tablet       . rosuvastatin (CRESTOR) 40 MG tablet Take 1 tablet (40 mg total) by mouth daily.  30 tablet  6  . Saxagliptin-Metformin (KOMBIGLYZE XR) 05-998 MG TB24 Take 1 tablet by mouth.      . diclofenac (VOLTAREN) 75 MG EC tablet TAKE 1 TABLET BY MOUTH TWICE DAILY  60 tablet  0   No current facility-administered medications on file prior to visit.    Ht 5' 3.25" (1.607 m)  Wt 284 lb 0.6 oz (128.84 kg)  BMI 49.89 kg/m2  Objective:   Physical Exam  Constitutional: She is oriented to person, place, and time. She appears well-developed and well-nourished. No distress.  HENT:  Head: Normocephalic and atraumatic.  Cardiovascular: Normal rate and regular rhythm.   No murmur heard. Pulmonary/Chest: Effort normal and breath sounds normal. No respiratory distress. She has no wheezes. She has no rales. She exhibits no tenderness.  Musculoskeletal: She exhibits no edema.  Neurological: She is alert and oriented to person, place, and time.  Psychiatric: She has a normal mood and affect. Her behavior is normal. Judgment and thought content normal.          Assessment & Plan:

## 2012-12-13 NOTE — Patient Instructions (Addendum)
Please complete your lab work prior to leaving. Follow up in 3 months.   

## 2012-12-13 NOTE — Progress Notes (Signed)
Pre visit review using our clinic review tool, if applicable. No additional management support is needed unless otherwise documented below in the visit note. 

## 2012-12-16 ENCOUNTER — Other Ambulatory Visit: Payer: Self-pay | Admitting: Family

## 2012-12-16 DIAGNOSIS — I350 Nonrheumatic aortic (valve) stenosis: Secondary | ICD-10-CM

## 2012-12-16 NOTE — Telephone Encounter (Signed)
Diabetes is uncontrolled.  I recommend addition of lantus insulin:  Inject 5 units under skin once daily at bedtime. Increase by 1 unit/day until fasting sugar <110, then continue on that dose until she is seen back in the office.  Please book office visit for nurse visit for insulin teaching. She should record sugars and bring log to follow up visit in 1 month.

## 2012-12-17 NOTE — Assessment & Plan Note (Signed)
Has not seen Cardiology in >3 yrs. Will arrange follow up. Clinically stable.

## 2012-12-17 NOTE — Assessment & Plan Note (Signed)
Refill voltaren, continue PPI for GI prophylaxis.

## 2012-12-17 NOTE — Assessment & Plan Note (Signed)
Lab Results  Component Value Date   CHOL 154 11/07/2011   HDL 39* 11/07/2011   LDLCALC 100* 11/07/2011   LDLDIRECT 161.2 02/02/2009   TRIG 77 11/07/2011   CHOLHDL 3.9 11/07/2011   LDL nearly at goal.  Plan follow up FLP next visit. Continue crestor.

## 2012-12-17 NOTE — Telephone Encounter (Signed)
Attempted to reach pt. Cell and home voicemails are full. Mailed letter to pt to contact our office for information below.

## 2012-12-17 NOTE — Telephone Encounter (Addendum)
Also, I see Virginia Flores has not seen cardiology in several years.  I would like to arrange follow up with Dr. Jens Som.  Also, I meant to ask her, is Virginia Flores using cpap for her sleep apnea? If not, we should consider a follow up sleep study to evaluate ongoing need.

## 2012-12-17 NOTE — Assessment & Plan Note (Signed)
Lab Results  Component Value Date   HGBA1C 8.0* 12/13/2012   Deteriorated. Add low lantus and titrate upward- see phone noted.

## 2012-12-18 ENCOUNTER — Encounter: Payer: Self-pay | Admitting: Family

## 2012-12-20 ENCOUNTER — Telehealth: Payer: Self-pay | Admitting: Family

## 2012-12-20 NOTE — Telephone Encounter (Signed)
PATIENT STATES SHE GOT A LETTER ASKING HER TO CALL [facility]

## 2012-12-23 NOTE — Telephone Encounter (Signed)
Left detailed message on home # re: below instructions and that we will repeat hgb a1c at her 3 month follow up and to call if any further questions.

## 2012-12-23 NOTE — Telephone Encounter (Signed)
Notified pt of below instructions. She states that she has only been taking metformin 500mg  once a day and was not taking Kombiglyze for a few months as she has lost her insurance and will not have Medicare until March. Pt wants to hold off on starting insulin and go back on Metformin 1000mg  twice a day and restart Kombiglyze as she states she was previously controlled on this regimen. She had not been taking prescribed doses due to cost / lack of insurance. Pt also wants to postpone cardiology referral as she states she was being followed for a murmur that was stable for the last 3 years and she stopped going. Thinks they will want to due another echocardiogram and she doesn't have the funds at present. Also states that she uses her CPAP machine on and off and feels she "needs to start using it on a more regular basis as she has gained 22lbs and is waking up with headaches. States she wasn't using it regularly previously because she had lost weight but has since regained it." Please advise.      Sandford Craze, NP at 12/17/2012 7:11 AM    Status: Addendum        Also, I see she has not seen cardiology in several years. I would like to arrange follow up with Dr. Jens Som. Also, I meant to ask her, is she using cpap for her sleep apnea? If not, we should consider a follow up sleep study to evaluate ongoing need.     Revision History        Date/Time User Action     > 12/17/2012 7:21 AM Sandford Craze, NP Addend      12/17/2012 7:15 AM Sandford Craze, NP Sign             Sandford Craze, NP at 12/16/2012 9:39 PM    Status: Signed        Diabetes is uncontrolled. I recommend addition of lantus insulin: Inject 5 units under skin once daily at bedtime. Increase by 1 unit/day until fasting sugar <110, then continue on that dose until she is seen back in the office. Please book office visit for nurse visit for insulin teaching. She should record sugars and bring log to follow up visit in 1  month.

## 2012-12-23 NOTE — Telephone Encounter (Addendum)
Resume Kombiglyze and metformin. We can repeat A1C in 3 months. If not improved, then we will need to start insulin.  Let me know when she can follow through with referral to cardiology.  I agree she should use CPAP nightly.

## 2013-01-01 ENCOUNTER — Ambulatory Visit: Payer: Self-pay

## 2013-01-13 ENCOUNTER — Encounter: Payer: Self-pay | Admitting: Cardiology

## 2013-01-15 ENCOUNTER — Ambulatory Visit: Payer: Self-pay | Admitting: Cardiology

## 2013-02-21 ENCOUNTER — Encounter: Payer: Self-pay | Admitting: Cardiology

## 2013-04-29 ENCOUNTER — Other Ambulatory Visit: Payer: Self-pay | Admitting: Family

## 2013-04-29 NOTE — Telephone Encounter (Signed)
Refill sent for voltaren.  Pt was due for follow up in March. Please call pt to arrange appt.

## 2013-05-01 NOTE — Telephone Encounter (Signed)
Left message for patient to return my call.

## 2013-05-02 NOTE — Telephone Encounter (Signed)
Left message for patient to return my call.

## 2013-05-06 NOTE — Telephone Encounter (Signed)
Left message for patient to return my call.

## 2013-07-11 ENCOUNTER — Telehealth: Payer: Self-pay | Admitting: Family

## 2013-07-11 DIAGNOSIS — E119 Type 2 diabetes mellitus without complications: Secondary | ICD-10-CM

## 2013-07-11 DIAGNOSIS — E785 Hyperlipidemia, unspecified: Secondary | ICD-10-CM

## 2013-07-11 NOTE — Telephone Encounter (Signed)
Left message for patient to call back and schedule follow up appointment with Melissa, diabetic bundle patient needs  BP check, LDL and A1C checked at visit

## 2013-08-11 NOTE — Addendum Note (Signed)
Addended by: Varney Daily on: 08/11/2013 01:45 PM   Modules accepted: Orders

## 2013-08-11 NOTE — Telephone Encounter (Signed)
Diabetic bundle  I tried to call patient but it states answering machine memory is full  I will send a letter.   Labs ordered  Pt needs A1C, LDL and BP check (follow up with Melissa)

## 2013-08-29 ENCOUNTER — Ambulatory Visit: Payer: Self-pay | Admitting: Family

## 2013-09-15 ENCOUNTER — Encounter: Payer: Self-pay | Admitting: Family

## 2013-09-15 ENCOUNTER — Ambulatory Visit (INDEPENDENT_AMBULATORY_CARE_PROVIDER_SITE_OTHER): Payer: Medicare Other | Admitting: Family

## 2013-09-15 VITALS — BP 158/87 | HR 76 | Temp 98.9°F | Resp 18 | Ht 63.0 in | Wt 271.0 lb

## 2013-09-15 DIAGNOSIS — Z1239 Encounter for other screening for malignant neoplasm of breast: Secondary | ICD-10-CM

## 2013-09-15 DIAGNOSIS — E119 Type 2 diabetes mellitus without complications: Secondary | ICD-10-CM

## 2013-09-15 DIAGNOSIS — Z1231 Encounter for screening mammogram for malignant neoplasm of breast: Secondary | ICD-10-CM

## 2013-09-15 DIAGNOSIS — E785 Hyperlipidemia, unspecified: Secondary | ICD-10-CM

## 2013-09-15 DIAGNOSIS — Z13820 Encounter for screening for osteoporosis: Secondary | ICD-10-CM

## 2013-09-15 DIAGNOSIS — I10 Essential (primary) hypertension: Secondary | ICD-10-CM

## 2013-09-15 DIAGNOSIS — Z0000 Encounter for general adult medical examination without abnormal findings: Secondary | ICD-10-CM

## 2013-09-15 MED ORDER — ROSUVASTATIN CALCIUM 40 MG PO TABS: 40.0000 mg | ORAL_TABLET | Freq: Every day | ORAL | Status: DC

## 2013-09-15 NOTE — Progress Notes (Signed)
Patient ID: Virginia Flores, female   DOB: 04/07/1950, 63 y.o.   MRN: [id number] Subjective:   Patient here for Medicare annual wellness visit and management of other chronic and acute problems.  Immunizations:  Declines flu shot, had Td in 2009, Diet: could improve Exercise:not exercising regularly  Wt Readings from Last 3 Encounters:  09/15/13 271 lb (122.925 kg)  12/13/12 284 lb 0.6 oz (128.84 kg)  10/25/12 269 lb (122.018 kg)  Colonoscopy: 2008, due 2017 Dexa:  due Pap Smear: hysterectomy Mammogram: 2009  DM2-  Has not taken kombiglyze x 1 year due to cost. Reports that she sees 130's-140's.  She is taking metformin only.   Lab Results  Component Value Date   HGBA1C 8.0* 12/13/2012   HGBA1C 6.8* 03/20/2012   HGBA1C 7.5* 11/07/2011   Lab Results  Component Value Date   MICROALBUR 1.62 03/20/2012   LDLCALC 100* 11/07/2011   CREATININE 0.93 12/13/2012   Hyperlipidemia- stopped zetia 2 months ago due to cost. Reports that she is taking crestor 40mg .  This is costly for her but she wishes to continue.   HTN- on zestoretic and labetalol Took BP this AM.   BP Readings from Last 3 Encounters:  09/15/13 158/87  12/13/12 140/86  10/25/12 158/87    Risk factors: DM/HTN  Roster of Physicians Providing Medical Care to Patient: Dr. Barbaraann Barthel- sports med  Activities of Daily Living  In your present state of health, do you have any difficulty performing the following activities? Preparing food and eating?: No  Bathing yourself: No  Getting dressed: No  Using the toilet:No  Moving around from place to place: No  In the past year have you fallen or had a near fall?:No    Home Safety: Has smoke detector and wears seat belts. No firearms. No excess sun exposure.  Diet and Exercise  Current exercise habits: none Dietary issues discussed: healthy diet   Depression Screen  (Note: if answer to either of the following is "Yes", then a more complete depression screening is indicated)   Q1: Over the past two weeks, have you felt down, depressed or hopeless?no  Q2: Over the past two weeks, have you felt little interest or pleasure in doing things? no   The following portions of the patient's history were reviewed and updated as appropriate: allergies, current medications, past family history, past medical history, past social history, past surgical history and problem list.  Past Medical History  Diagnosis Date  . Arthritis   . Diabetes mellitus, type 2   . Hypertension   . Hyperlipidemia     History   Social History  . Marital Status: Married    Spouse Name: N/A    Number of Children: N/A  . Years of Education: N/A   Occupational History  . Not on file.   Social History Main Topics  . Smoking status: Never Smoker   . Smokeless tobacco: Never Used  . Alcohol Use: Yes  . Drug Use: No  . Sexual Activity: Not on file   Other Topics Concern  . Not on file   Social History Narrative  . No narrative on file    Past Surgical History  Procedure Laterality Date  . Abdominal hysterectomy  1989  . Laparoscopic gastric banding  2009  . Tonsillectomy  1979    Family History  Problem Relation Age of Onset  . Arthritis      paternal grandparents  . Hyperlipidemia Mother   . Stroke Mother  maternal grandmother  . Hypertension Mother   . Diabetes Mother   . Hyperlipidemia Father     maternal/paternal grandparents  . Heart disease Father   . Hypertension Father   . Hypertension Paternal Grandmother     No Known Allergies  Current Outpatient Prescriptions on File Prior to Visit  Medication Sig Dispense Refill  . acetaminophen (TYLENOL) 500 MG tablet Take 1,500 mg by mouth daily as needed.      . diclofenac (VOLTAREN) 75 MG EC tablet TAKE 1 TABLET BY MOUTH TWICE DAILY  60 tablet  2  . ezetimibe (ZETIA) 10 MG tablet Take 1 tablet (10 mg total) by mouth daily.  30 tablet  6  . Glucosamine 750 MG TABS Take 1 tablet by mouth 2 (two) times daily.       [name] labetalol (NORMODYNE) 200 MG tablet TAKE 2 TABLETS (400 MG TOTAL) BY MOUTH 2 (TWO) TIMES DAILY.  120 tablet  5  . lisinopril-hydrochlorothiazide (PRINZIDE,ZESTORETIC) 20-25 MG per tablet TAKE 2 TABLETS BY MOUTH DAILY.  60 tablet  5  . metFORMIN (GLUCOPHAGE) 1000 MG tablet Take 1 tablet (1,000 mg total) by mouth 2 (two) times daily with a meal.  60 tablet  5  . rosuvastatin (CRESTOR) 40 MG tablet Take 1 tablet (40 mg total) by mouth daily.  30 tablet  6  . Saxagliptin-Metformin (KOMBIGLYZE XR) 05-998 MG TB24 Take 1 tablet by mouth daily.  30 tablet  5   No current facility-administered medications on file prior to visit.    BP 158/87  Pulse 76  Temp(Src) 98.9 F (37.2 C) (Oral)  Resp 18  Ht 5\' 3"  (1.6 m)  Wt 271 lb (122.925 kg)  BMI 48.02 kg/m2  SpO2 97%      Objective:   Vision: see nursing Hearing: able to hear forced whisper at feet Body mass index: Body mass index is 48.02 kg/(m^2). Cognitive Impairment Assessment: cognition, memory and judgment appear normal.   Physical Exam  Constitutional: She is oriented to person, place, and time. She appears well-developed and well-nourished. No distress.  HENT:  Head: Normocephalic and atraumatic.  Right Ear: Tympanic membrane and ear canal normal.  Left Ear: Tympanic membrane and ear canal normal.  Mouth/Throat: Oropharynx is clear and moist.  Eyes: Pupils are equal, round, and reactive to light. No scleral icterus.  Neck: Normal range of motion. No thyromegaly present.  Cardiovascular: Normal rate and regular rhythm.   No murmur heard. Pulmonary/Chest: Effort normal and breath sounds normal. No respiratory distress. He has no wheezes. She has no rales. She exhibits no tenderness.  Abdominal: Soft. Bowel sounds are normal. He exhibits no distension and no mass. There is no tenderness. There is no rebound and no guarding.  Musculoskeletal: She exhibits no edema.  Lymphadenopathy:    She has no cervical adenopathy.   Neurological: She is alert and oriented to person, place, and time. She has normal patellar reflexes. She exhibits normal muscle tone. Coordination normal.  Skin: Skin is warm and dry.  Psychiatric: She has a normal mood and affect. Her behavior is normal. Judgment and thought content normal.  Breasts: Examined lying Right: Without masses, retractions, discharge or axillary adenopathy.  Left: Without masses, retractions, discharge or axillary adenopathy.  Pelvic: deferred- s/p hysterectomy     Assessment & Plan:    Assessment:   Medicare wellness- due for mammogram  Plan:    During the course of the visit the patient was educated and counseled about appropriate screening and preventive  services including:    Screening mammography  Nutrition counseling/weight loss/exercise  Vaccines / LABS  Declines flu shot today. Patient Instructions (the written plan) was given to the patient.

## 2013-09-15 NOTE — Patient Instructions (Signed)
Please return fasting for lab work later this week.  Follow up in 3 months.

## 2013-09-15 NOTE — Progress Notes (Signed)
Pre visit review using our clinic review tool, if applicable. No additional management support is needed unless otherwise documented below in the visit note. 

## 2013-09-17 ENCOUNTER — Other Ambulatory Visit (INDEPENDENT_AMBULATORY_CARE_PROVIDER_SITE_OTHER): Payer: Medicare Other

## 2013-09-17 DIAGNOSIS — E119 Type 2 diabetes mellitus without complications: Secondary | ICD-10-CM

## 2013-09-17 DIAGNOSIS — Z0000 Encounter for general adult medical examination without abnormal findings: Secondary | ICD-10-CM

## 2013-09-17 DIAGNOSIS — I10 Essential (primary) hypertension: Secondary | ICD-10-CM

## 2013-09-17 LAB — HEMOGLOBIN A1C: HEMOGLOBIN A1C: 7.6 % — AB (ref 4.6–6.5)

## 2013-09-18 ENCOUNTER — Telehealth: Payer: Self-pay | Admitting: Family

## 2013-09-18 LAB — LIPID PANEL
CHOL/HDL RATIO: 5
Cholesterol: 217 mg/dL — ABNORMAL HIGH (ref 0–200)
HDL: 43.3 mg/dL (ref >39.00)
LDL CALC: 156 mg/dL — AB (ref 0–99)
NonHDL: 173.7
Triglycerides: 89 mg/dL (ref 0.0–149.0)
VLDL: 17.8 mg/dL (ref 0.0–40.0)

## 2013-09-18 MED ORDER — AMLODIPINE BESYLATE 5 MG PO TABS: 5.0000 mg | ORAL_TABLET | Freq: Every day | ORAL | Status: DC

## 2013-09-18 NOTE — Assessment & Plan Note (Signed)
Repeat flp, continue crestor.

## 2013-09-18 NOTE — Assessment & Plan Note (Signed)
She will return for A1C. Further recommendations pending A1C results.

## 2013-09-18 NOTE — Telephone Encounter (Signed)
I would also like her to add amlodipine for her blood pressure.  Follow up in 1 month so we can recheck BP.

## 2013-09-18 NOTE — Telephone Encounter (Signed)
I am not certain we offered her prevnar and zostavax at her visit. Would you please contact her and if she would like to receive she should schedule nurse visit. These shots need to be spaced 1 month apart.

## 2013-09-18 NOTE — Assessment & Plan Note (Signed)
Uncontrolled 

## 2013-09-18 NOTE — Addendum Note (Signed)
Addended by: [name] on: 09/18/2013 11:11 AM   Modules accepted: Level of Service

## 2013-09-19 MED ORDER — SITAGLIPTIN PHOSPHATE 100 MG PO TABS
100.0000 mg | ORAL_TABLET | Freq: Every day | ORAL | Status: DC
Start: 2013-09-19 — End: 2013-09-23

## 2013-09-19 MED ORDER — EZETIMIBE 10 MG PO TABS: 10.0000 mg | ORAL_TABLET | Freq: Every day | ORAL | Status: DC

## 2013-09-19 NOTE — Telephone Encounter (Signed)
Notified pt and she voices understanding. Scheduled 1 month f/u for 10/20/13 at 10:45am.  Pt states she will get flu and pneumococcal vaccine in about 1 month as she had some activity to do and did not want to get these before that time. Pt will check with insurance regarding shingles vaccine and let us know if she wants to proceed.  Please advise if there are any further instructions re: lab results.

## 2013-09-19 NOTE — Telephone Encounter (Signed)
Attempted to reach pt. Cell # [phone]) no longer belongs to pt. Left message with pt's husband to have pt return my call.

## 2013-09-19 NOTE — Telephone Encounter (Signed)
Cholesterol is above goal. Continue crestor, add zetia.  Diabetes above goal. Continue metformin, add [place].

## 2013-09-19 NOTE — Telephone Encounter (Signed)
Left message to return my call. Per verbal from Provider, if pt had been off of Crestor and metformin for some time prior to recent labs then she should resume those medications and not add additional zetia or [place]. If pt has been taking medications consistently then proceed with instructions as below.

## 2013-09-23 NOTE — Telephone Encounter (Signed)
Patient informed, understood & agreed; pt is newly resuming Crestor & Metformin, new Rx[s] discontinued per provider, pt will add Amlodipine to regimen for BP/SLS

## 2013-10-08 ENCOUNTER — Inpatient Hospital Stay: Admission: RE | Admit: 2013-10-08 | Payer: 59 | Source: Ambulatory Visit

## 2013-10-13 ENCOUNTER — Ambulatory Visit (INDEPENDENT_AMBULATORY_CARE_PROVIDER_SITE_OTHER)
Admission: RE | Admit: 2013-10-13 | Discharge: 2013-10-13 | Disposition: A | Discharge status: Home / Self Care | Payer: Medicare Other | Source: Ambulatory Visit | Attending: Family | Admitting: Family

## 2013-10-13 DIAGNOSIS — Z13820 Encounter for screening for osteoporosis: Secondary | ICD-10-CM

## 2013-10-20 ENCOUNTER — Telehealth: Payer: Self-pay | Admitting: Family

## 2013-10-20 ENCOUNTER — Ambulatory Visit: Payer: 59 | Admitting: Family

## 2013-10-20 DIAGNOSIS — M810 Age-related osteoporosis without current pathological fracture: Secondary | ICD-10-CM

## 2013-10-20 HISTORY — DX: Age-related osteoporosis without current pathological fracture: M81.0

## 2013-10-20 NOTE — Telephone Encounter (Signed)
Please let pt know that I reviewed bone density and it shows some bone thinning. I would like her to add caltrate 600mg  +D twice daily and make sure to get regular exercise such as walking.

## 2013-10-20 NOTE — Telephone Encounter (Signed)
Notified pt and she voices understanding. 

## 2013-10-30 ENCOUNTER — Other Ambulatory Visit: Payer: Self-pay | Admitting: Family

## 2013-10-31 ENCOUNTER — Encounter: Payer: Self-pay | Admitting: Family

## 2013-10-31 ENCOUNTER — Ambulatory Visit (INDEPENDENT_AMBULATORY_CARE_PROVIDER_SITE_OTHER): Payer: Medicare Other | Admitting: Family

## 2013-10-31 VITALS — BP 138/86 | HR 73 | Temp 98.0°F | Resp 16 | Ht 63.0 in | Wt 272.2 lb

## 2013-10-31 DIAGNOSIS — Z23 Encounter for immunization: Secondary | ICD-10-CM

## 2013-10-31 DIAGNOSIS — I10 Essential (primary) hypertension: Secondary | ICD-10-CM

## 2013-10-31 DIAGNOSIS — M810 Age-related osteoporosis without current pathological fracture: Secondary | ICD-10-CM

## 2013-10-31 MED ORDER — LISINOPRIL-HYDROCHLOROTHIAZIDE 20-25 MG PO TABS: ORAL_TABLET | ORAL | Status: DC

## 2013-10-31 MED ORDER — LABETALOL HCL 200 MG PO TABS: ORAL_TABLET | ORAL | Status: DC

## 2013-10-31 MED ORDER — METFORMIN HCL 1000 MG PO TABS: 1000.0000 mg | ORAL_TABLET | Freq: Two times a day (BID) | ORAL | Status: DC

## 2013-10-31 NOTE — Progress Notes (Signed)
Pre visit review using our clinic review tool, if applicable. No additional management support is needed unless otherwise documented below in the visit note. 

## 2013-10-31 NOTE — Progress Notes (Signed)
   Subjective:    Patient ID: Virginia Flores, female    DOB: December 13, 1950, 63 y.o.   MRN: [id number]  HPI   Virginia Flores is a 63 yr old female who presents today for follow up of her blood pressure.  She has been monitoring BP at home and readings have been 120's/70's.  She denies CP/SOB or swelling.   BP Readings from Last 3 Encounters:  10/31/13 138/86  09/15/13 158/87  12/13/12 140/86     Review of Systems See HPI     Objective:   Physical Exam  Constitutional: She is oriented to person, place, and time. She appears well-developed and well-nourished. No distress.  HENT:  Head: Normocephalic and atraumatic.  Cardiovascular: Normal rate and regular rhythm.   No murmur heard. Pulmonary/Chest: Effort normal and breath sounds normal. No respiratory distress. She has no wheezes. She has no rales. She exhibits no tenderness.  Neurological: She is alert and oriented to person, place, and time.  Psychiatric: She has a normal mood and affect. Her behavior is normal. Judgment and thought content normal.          Assessment & Plan:

## 2013-10-31 NOTE — Patient Instructions (Signed)
Please complete lab work prior to leaving. Follow up in 3 months.  

## 2013-11-01 ENCOUNTER — Telehealth: Payer: Self-pay | Admitting: Family

## 2013-11-01 DIAGNOSIS — E559 Vitamin D deficiency, unspecified: Secondary | ICD-10-CM | POA: Insufficient documentation

## 2013-11-01 HISTORY — DX: Vitamin D deficiency, unspecified: E55.9

## 2013-11-01 LAB — VITAMIN D 25 HYDROXY (VIT D DEFICIENCY, FRACTURES): Vit D, 25-Hydroxy: 14 ng/mL — ABNORMAL LOW (ref 30–89)

## 2013-11-01 MED ORDER — VITAMIN D (ERGOCALCIFEROL) 1.25 MG (50000 UNIT) PO CAPS: 50000.0000 [IU] | ORAL_CAPSULE | ORAL | Status: DC

## 2013-11-01 NOTE — Assessment & Plan Note (Signed)
BP improved, continue current meds.

## 2013-11-01 NOTE — Telephone Encounter (Signed)
Vit d is low, add once weekly vit D, repeat level in 3 months.

## 2013-11-03 NOTE — Telephone Encounter (Signed)
Notified pt and she voices understanding. Lab appt scheduled for 02/03/13 at 11am and future order entered.

## 2013-11-20 ENCOUNTER — Other Ambulatory Visit: Payer: Self-pay | Admitting: Family

## 2014-01-27 ENCOUNTER — Telehealth: Payer: Self-pay | Admitting: Family

## 2014-01-27 DIAGNOSIS — E785 Hyperlipidemia, unspecified: Secondary | ICD-10-CM

## 2014-01-27 NOTE — Telephone Encounter (Signed)
Caller name: Laverda Relation to pt: self Call back number: [phone] Pharmacy:  Reason for call:   Patient states that her insurance company keeps telling her that crestor is too expensive and will deplete her insurance account in 8 months and would like to know if something else could be prescribed.

## 2014-01-28 MED ORDER — ATORVASTATIN CALCIUM 80 MG PO TABS: 80.0000 mg | ORAL_TABLET | Freq: Every day | ORAL | Status: DC

## 2014-01-28 NOTE — Telephone Encounter (Signed)
D/c crestor.  Start atorvastatin 80. Repeat flp in 6 weeks.

## 2014-01-28 NOTE — Telephone Encounter (Signed)
LMOM with contact name and number for return call RE: Medication change request w/new Rx to pharmacy and further provider instructions to call office and schedule fasting Lab appt for 6 wks. From start of new medication, future lab orders placed/SLS

## 2014-02-03 ENCOUNTER — Other Ambulatory Visit: Payer: Medicare Other

## 2014-04-13 ENCOUNTER — Other Ambulatory Visit: Payer: Self-pay | Admitting: Family

## 2014-04-13 NOTE — Telephone Encounter (Signed)
Pt last seen 10/2013 and is past due for 3 month office visit and labs. 30 day supply of amlodipine, diclofenac and labetalol sent to pharmacy. Please call pt to arrange f/u  Before further refills are due

## 2014-04-15 NOTE — Telephone Encounter (Signed)
lvm advising pt to schedule appointment as per MD request

## 2014-05-05 ENCOUNTER — Telehealth: Payer: Self-pay | Admitting: Family

## 2014-05-05 ENCOUNTER — Ambulatory Visit: Payer: Self-pay | Admitting: Family

## 2014-05-05 NOTE — Telephone Encounter (Signed)
PT called and left VM day of appt= had conflicting appt= rescheduled for tomorrow.

## 2014-05-05 NOTE — Telephone Encounter (Signed)
Noted  

## 2014-05-06 ENCOUNTER — Ambulatory Visit (INDEPENDENT_AMBULATORY_CARE_PROVIDER_SITE_OTHER): Payer: Medicare Other | Admitting: Family

## 2014-05-06 ENCOUNTER — Other Ambulatory Visit: Payer: Self-pay | Admitting: Family

## 2014-05-06 ENCOUNTER — Encounter: Payer: Self-pay | Admitting: Family

## 2014-05-06 VITALS — BP 130/84 | HR 71 | Temp 98.0°F | Resp 16 | Ht 63.0 in | Wt 268.0 lb

## 2014-05-06 DIAGNOSIS — E1165 Type 2 diabetes mellitus with hyperglycemia: Secondary | ICD-10-CM

## 2014-05-06 DIAGNOSIS — M1990 Unspecified osteoarthritis, unspecified site: Secondary | ICD-10-CM

## 2014-05-06 DIAGNOSIS — I10 Essential (primary) hypertension: Secondary | ICD-10-CM | POA: Diagnosis not present

## 2014-05-06 DIAGNOSIS — E559 Vitamin D deficiency, unspecified: Secondary | ICD-10-CM | POA: Diagnosis not present

## 2014-05-06 DIAGNOSIS — E785 Hyperlipidemia, unspecified: Secondary | ICD-10-CM | POA: Diagnosis not present

## 2014-05-06 DIAGNOSIS — IMO0002 Reserved for concepts with insufficient information to code with codable children: Secondary | ICD-10-CM

## 2014-05-06 LAB — LIPID PANEL
Cholesterol: 197 mg/dL (ref 0–200)
HDL: 37.4 mg/dL — ABNORMAL LOW (ref >39.00)
LDL Cholesterol: 145 mg/dL — ABNORMAL HIGH (ref 0–99)
NONHDL: 159.6
Total CHOL/HDL Ratio: 5
Triglycerides: 75 mg/dL (ref 0.0–149.0)
VLDL: 15 mg/dL (ref 0.0–40.0)

## 2014-05-06 LAB — VITAMIN D 25 HYDROXY (VIT D DEFICIENCY, FRACTURES): VITD: 28.97 ng/mL — AB (ref 30.00–100.00)

## 2014-05-06 LAB — BASIC METABOLIC PANEL
BUN: 16 mg/dL (ref 6–23)
CO2: 30 mEq/L (ref 19–32)
Calcium: 9.3 mg/dL (ref 8.4–10.5)
Chloride: 105 mEq/L (ref 96–112)
Creatinine, Ser: 0.89 mg/dL (ref 0.40–1.20)
GFR: 81.57 mL/min (ref >60.00)
Glucose, Bld: 158 mg/dL — ABNORMAL HIGH (ref 70–99)
Potassium: 3.8 mEq/L (ref 3.5–5.1)
Sodium: 140 mEq/L (ref 135–145)

## 2014-05-06 LAB — MICROALBUMIN / CREATININE URINE RATIO
Creatinine,U: 333.5 mg/dL
Microalb Creat Ratio: 1.1 mg/g (ref 0.0–30.0)
Microalb, Ur: 3.7 mg/dL — ABNORMAL HIGH (ref 0.0–1.9)

## 2014-05-06 LAB — HEMOGLOBIN A1C: Hgb A1c MFr Bld: 8.4 % — ABNORMAL HIGH (ref 4.6–6.5)

## 2014-05-06 MED ORDER — AMLODIPINE BESYLATE 5 MG PO TABS: ORAL_TABLET | ORAL | Status: DC

## 2014-05-06 MED ORDER — LABETALOL HCL 200 MG PO TABS: ORAL_TABLET | ORAL | Status: DC

## 2014-05-06 MED ORDER — LISINOPRIL-HYDROCHLOROTHIAZIDE 20-25 MG PO TABS: ORAL_TABLET | ORAL | Status: DC

## 2014-05-06 MED ORDER — METFORMIN HCL 1000 MG PO TABS: 1000.0000 mg | ORAL_TABLET | Freq: Two times a day (BID) | ORAL | Status: DC

## 2014-05-06 MED ORDER — ATORVASTATIN CALCIUM 80 MG PO TABS: 80.0000 mg | ORAL_TABLET | Freq: Every day | ORAL | Status: DC

## 2014-05-06 NOTE — Telephone Encounter (Signed)
Diabetes control has worsened. Add [place].  Cholesterol is above goal. D/c lipitor, start crestor 20mg . (please contact pharmacy and cancel rx for lipitor).  Vit D low, restart 12 week course of vit d.  Rx's pended below.

## 2014-05-06 NOTE — Assessment & Plan Note (Signed)
Declined prevnar today.  Continue metformin. Obtain a1c, urine microalbumin, advised pt to schedule eye exam.

## 2014-05-06 NOTE — Assessment & Plan Note (Signed)
I advised against increasing her voltaren if at all possible. OK to add tylenol prn (she does this already). We also discussed importance of weight loss and exercise (such as water aerobics/water walking) to help with arthritis pain.

## 2014-05-06 NOTE — Patient Instructions (Signed)
Please complete lab work prior to leaving. Schedule eye exam. Follow up in 3 months.

## 2014-05-06 NOTE — Progress Notes (Signed)
Pre visit review using our clinic review tool, if applicable. No additional management support is needed unless otherwise documented below in the visit note. 

## 2014-05-06 NOTE — Assessment & Plan Note (Signed)
Obtain Lipid panel, continue statin.

## 2014-05-06 NOTE — Progress Notes (Signed)
Subjective:    Patient ID: Virginia Flores, female    DOB: 01-21-1950, 64 y.o.   MRN: [id number]  HPI  Virginia Flores is a 64 yr old female who presents today for follow up.   HTN-  Patient is currently maintained on the following medications for blood pressure: amlodipine 5mg , zestoretic 20-25 Patient reports good compliance with blood pressure medications. Patient denies chest pain, shortness of breath or swelling. Last 3 blood pressure readings in our office are as follows:  BP Readings from Last 3 Encounters:  05/06/14 130/84  10/31/13 138/86  09/15/13 158/87   Arthritis- currently maintained on voltaren tabs.  Reports that she is using tylenol.  Thinking about swimming.   DM2- Pt is currently maintained on the following medications for diabetes:metformin Last A1C was:   Lab Results  Component Value Date   HGBA1C 7.6* 09/17/2013  Last diabetic eye exam was 8/14 Denies polyuria/polydipsia. Denies hypoglycemia   Hyperlipidemia- Patient is currently maintained on the following medication for hyperlipidemia: lipitor 80mg .  Last lipid panel as follows:   Lab Results  Component Value Date   CHOL 217* 09/17/2013   HDL 43.30 09/17/2013   LDLCALC 156* 09/17/2013   LDLDIRECT 161.2 02/02/2009   TRIG 89.0 09/17/2013   CHOLHDL 5 09/17/2013   Patient denies myalgia. Patient reports good compliance with low fat/low cholesterol diet.   Review of Systems See HPI  Past Medical History  Diagnosis Date  . Arthritis   . Diabetes mellitus, type 2   . Hypertension   . Hyperlipidemia     History   Social History  . Marital Status: Married    Spouse Name: N/A  . Number of Children: N/A  . Years of Education: N/A   Occupational History  . Not on file.   Social History Main Topics  . Smoking status: Never Smoker   . Smokeless tobacco: Never Used  . Alcohol Use: Yes  . Drug Use: No  . Sexual Activity: Not on file   Other Topics Concern  . Not on file   Social  History Narrative    Past Surgical History  Procedure Laterality Date  . Abdominal hysterectomy  1989  . Laparoscopic gastric banding  2009  . Tonsillectomy  1979    Family History  Problem Relation Age of Onset  . Arthritis      paternal grandparents  . Hyperlipidemia Mother   . Stroke Mother     maternal grandmother  . Hypertension Mother   . Diabetes Mother   . Hyperlipidemia Father     maternal/paternal grandparents  . Heart disease Father   . Hypertension Father   . Hypertension Paternal Grandmother     No Known Allergies  Current Outpatient Prescriptions on File Prior to Visit  Medication Sig Dispense Refill  . acetaminophen (TYLENOL) 500 MG tablet Take 1,500 mg by mouth daily as needed.    [name] amLODipine (NORVASC) 5 MG tablet TAKE 1 TABLET (5 MG TOTAL) BY MOUTH DAILY. 30 tablet 0  . atorvastatin (LIPITOR) 80 MG tablet Take 1 tablet (80 mg total) by mouth daily. 30 tablet 2  . diclofenac (VOLTAREN) 75 MG EC tablet TAKE 1 TABLET BY MOUTH TWICE DAILY 60 tablet 0  . Glucosamine 750 MG TABS Take 1 tablet by mouth 2 (two) times daily.    [name] labetalol (NORMODYNE) 200 MG tablet TAKE 2 TABLETS (400 MG TOTAL) BY MOUTH 2 TIMES A DAY 120 tablet 0  . lisinopril-hydrochlorothiazide (PRINZIDE,ZESTORETIC) 20-25 MG per tablet  TAKE 2 TABLETS BY MOUTH DAILY. 180 tablet 1  . metFORMIN (GLUCOPHAGE) 1000 MG tablet Take 1 tablet (1,000 mg total) by mouth 2 (two) times daily with a meal. 180 tablet 1  . Vitamin D, Ergocalciferol, (DRISDOL) 50000 UNITS CAPS capsule Take 1 capsule (50,000 Units total) by mouth every 7 (seven) days. 12 capsule 0   No current facility-administered medications on file prior to visit.    BP 130/84 mmHg  Pulse 71  Temp(Src) 98 F (36.7 C) (Oral)  Resp 16  Ht 5\' 3"  (1.6 m)  Wt 268 lb (121.564 kg)  BMI 47.49 kg/m2  SpO2 97%       Objective:   Physical Exam  Constitutional: She appears well-developed and well-nourished.  HENT:  Head: Normocephalic and  atraumatic.  Cardiovascular: Normal rate, regular rhythm and normal heart sounds.   No murmur heard. Pulmonary/Chest: Effort normal and breath sounds normal. No respiratory distress. She has no wheezes.  Psychiatric: She has a normal mood and affect. Her behavior is normal. Judgment and thought content normal.          Assessment & Plan:

## 2014-05-06 NOTE — Assessment & Plan Note (Signed)
BP stable on current meds. Continue same, obtain bmet.  

## 2014-05-17 NOTE — Telephone Encounter (Signed)
Have you spoken to pt re: below? thanks

## 2014-05-22 NOTE — Telephone Encounter (Signed)
OK to continue lipitor (pended).  I would recommend d/c metformin, d/c janumet start janumet xr once daily. This has extended release metformin (often better tolerated) + [place].  Let me know if sh has problems with this med.

## 2014-05-22 NOTE — Telephone Encounter (Signed)
Notified pt. She states that [facility] will not cover Crestor and she would be in the donught hole soon. Pt wants to take Lipitor 80mg  until her next visit to see if it will improve with a little more time. Also states she is not taking metformin twice a day and it causes diarrhea. Has only been able to take it at bedtime. Wants to know if there is another alternative without same side effect that is cost effective?

## 2014-05-25 MED ORDER — SITAGLIP PHOS-METFORMIN HCL ER 50-1000 MG PO TB24: 2.0000 | ORAL_TABLET | Freq: Every day | ORAL | Status: DC

## 2014-05-25 MED ORDER — VITAMIN D (ERGOCALCIFEROL) 1.25 MG (50000 UNIT) PO CAPS: 50000.0000 [IU] | ORAL_CAPSULE | ORAL | Status: DC

## 2014-05-25 NOTE — Telephone Encounter (Signed)
Attempted to reach pt. No answer, no voicemail. Sent [employer].

## 2014-05-29 ENCOUNTER — Other Ambulatory Visit: Payer: Self-pay | Admitting: Family

## 2014-05-31 MED ORDER — DICLOFENAC SODIUM 75 MG PO TBEC: 75.0000 mg | DELAYED_RELEASE_TABLET | Freq: Two times a day (BID) | ORAL | Status: DC | PRN

## 2014-06-09 ENCOUNTER — Encounter: Payer: Self-pay | Admitting: *Deleted

## 2014-06-09 NOTE — Telephone Encounter (Signed)
Received notice that 05/22/14 mychart message has not been read yet. Left message on home# to log into mychart account and check message and let us know if she has any questions. Letter also mailed to pt.

## 2014-06-29 ENCOUNTER — Other Ambulatory Visit: Payer: Self-pay

## 2014-07-21 ENCOUNTER — Telehealth: Payer: Self-pay

## 2014-07-21 NOTE — Telephone Encounter (Signed)
Ok to split into components- same dose.

## 2014-07-21 NOTE — Telephone Encounter (Signed)
Received fax from pharmacy that pt request to have Janumet split up into two prescriptions for Januvia and Metformin ER due to insurance.  Please advise.

## 2014-07-21 NOTE — Addendum Note (Signed)
Addended by: Tasia Catchings on: 07/21/2014 12:03 PM   Modules accepted: Orders

## 2014-07-22 NOTE — Telephone Encounter (Signed)
Attempted to reach pt at home # but "memory is full".  Will try later.

## 2014-07-22 NOTE — Telephone Encounter (Signed)
Attempted to reach pt at home # but "mailbox is full". Will try later.

## 2014-07-24 MED ORDER — SITAGLIPTIN PHOSPHATE 50 MG PO TABS: 100.0000 mg | ORAL_TABLET | Freq: Every day | ORAL | Status: DC

## 2014-07-24 MED ORDER — METFORMIN HCL 1000 MG PO TABS: 2000.0000 mg | ORAL_TABLET | Freq: Every day | ORAL | Status: DC

## 2014-07-24 NOTE — Telephone Encounter (Signed)
Attempted to reach pt. No answer, no voicemail. Januvia and metformin Rxs sent to pharmacy. Pt will be due for follow up in August. Will mail letter to pt.

## 2014-07-24 NOTE — Addendum Note (Signed)
Addended by: Kelle Darting A on: 07/24/2014 02:16 PM   Modules accepted: Orders

## 2014-09-04 ENCOUNTER — Ambulatory Visit (INDEPENDENT_AMBULATORY_CARE_PROVIDER_SITE_OTHER): Payer: Medicare Other | Admitting: Family

## 2014-09-04 ENCOUNTER — Encounter: Payer: Self-pay | Admitting: Family

## 2014-09-04 VITALS — BP 156/88 | HR 77 | Temp 98.2°F | Resp 18 | Ht 63.0 in | Wt 269.2 lb

## 2014-09-04 DIAGNOSIS — M545 Low back pain, unspecified: Secondary | ICD-10-CM | POA: Insufficient documentation

## 2014-09-04 DIAGNOSIS — E559 Vitamin D deficiency, unspecified: Secondary | ICD-10-CM

## 2014-09-04 DIAGNOSIS — E1165 Type 2 diabetes mellitus with hyperglycemia: Secondary | ICD-10-CM

## 2014-09-04 DIAGNOSIS — R002 Palpitations: Secondary | ICD-10-CM | POA: Diagnosis not present

## 2014-09-04 DIAGNOSIS — E785 Hyperlipidemia, unspecified: Secondary | ICD-10-CM | POA: Diagnosis not present

## 2014-09-04 DIAGNOSIS — IMO0002 Reserved for concepts with insufficient information to code with codable children: Secondary | ICD-10-CM

## 2014-09-04 DIAGNOSIS — I10 Essential (primary) hypertension: Secondary | ICD-10-CM

## 2014-09-04 HISTORY — DX: Low back pain, unspecified: M54.50

## 2014-09-04 LAB — CBC WITH DIFFERENTIAL/PLATELET
BASOS ABS: 0 10*3/uL (ref 0.0–0.1)
Basophils Relative: 0 % (ref 0–1)
EOS ABS: 0.2 10*3/uL (ref 0.0–0.7)
EOS PCT: 2 % (ref 0–5)
HCT: 37.1 % (ref 36.0–46.0)
Hemoglobin: 12 g/dL (ref 12.0–15.0)
LYMPHS ABS: 2.3 10*3/uL (ref 0.7–4.0)
LYMPHS PCT: 29 % (ref 12–46)
MCH: 22.8 pg — ABNORMAL LOW (ref 26.0–34.0)
MCHC: 32.3 g/dL (ref 30.0–36.0)
MCV: 70.4 fL — AB (ref 78.0–100.0)
MPV: 9.3 fL (ref 8.6–12.4)
Monocytes Absolute: 0.7 10*3/uL (ref 0.1–1.0)
Monocytes Relative: 9 % (ref 3–12)
NEUTROS PCT: 60 % (ref 43–77)
Neutro Abs: 4.7 10*3/uL (ref 1.7–7.7)
PLATELETS: 294 10*3/uL (ref 150–400)
RBC: 5.27 MIL/uL — AB (ref 3.87–5.11)
RDW: 17 % — AB (ref 11.5–15.5)
WBC: 7.9 10*3/uL (ref 4.0–10.5)

## 2014-09-04 LAB — BASIC METABOLIC PANEL
BUN: 19 mg/dL (ref 7–25)
CALCIUM: 9.4 mg/dL (ref 8.6–10.4)
CO2: 30 mmol/L (ref 20–31)
Chloride: 102 mmol/L (ref 98–110)
Creat: 0.95 mg/dL (ref 0.50–0.99)
GLUCOSE: 116 mg/dL — AB (ref 65–99)
Potassium: 4.1 mmol/L (ref 3.5–5.3)
Sodium: 147 mmol/L — ABNORMAL HIGH (ref 135–146)

## 2014-09-04 LAB — LIPID PANEL
CHOLESTEROL: 237 mg/dL — AB (ref 125–200)
HDL: 47 mg/dL (ref >=46)
LDL Cholesterol: 174 mg/dL — ABNORMAL HIGH (ref <130)
Total CHOL/HDL Ratio: 5 Ratio (ref <=5.0)
Triglycerides: 78 mg/dL (ref <150)
VLDL: 16 mg/dL (ref <30)

## 2014-09-04 LAB — HEMOGLOBIN A1C
Hgb A1c MFr Bld: 7.8 % — ABNORMAL HIGH (ref <5.7)
MEAN PLASMA GLUCOSE: 177 mg/dL — AB (ref <117)

## 2014-09-04 MED ORDER — METHYLPREDNISOLONE 4 MG PO TBPK: ORAL_TABLET | ORAL | Status: DC

## 2014-09-04 MED ORDER — HYDROCODONE-ACETAMINOPHEN 5-325 MG PO TABS: 1.0000 | ORAL_TABLET | Freq: Four times a day (QID) | ORAL | Status: DC | PRN

## 2014-09-04 NOTE — Progress Notes (Signed)
Pre visit review using our clinic review tool, if applicable. No additional management support is needed unless otherwise documented below in the visit note. 

## 2014-09-04 NOTE — Assessment & Plan Note (Signed)
Rx with medrol dose pack.  Close monitoring of sugars while on steroids.

## 2014-09-04 NOTE — Patient Instructions (Addendum)
Please complete lab work prior to leaving.  Resume lisinopril-hctz.   Start medrol dose pak. You may use vicoden prn short term, then transition to tylenol. Check sugar twice daily while on steroids. Call if sugar >300. Follow up in 3 months, call sooner if symptoms worsen or do not improve.

## 2014-09-04 NOTE — Assessment & Plan Note (Signed)
Home sugars are good. Obtain A1C.

## 2014-09-04 NOTE — Assessment & Plan Note (Signed)
Uncontrolled. Likely due to pain and not taking lisinopril hctz.  Resume lisinopril hctz.

## 2014-09-04 NOTE — Assessment & Plan Note (Signed)
ldl above goal last visit.  On lipitor, obtain fllow up lipid panel.

## 2014-09-04 NOTE — Progress Notes (Signed)
Subjective:    Patient ID: Virginia Flores, female    DOB: 1950-11-27, 64 y.o.   MRN: [id number]  HPI   Virginia Flores is a 64 yr old female who presents today with chief complaint of left sided low back pain.  Reports pain started last week (mild) by Saturday symptoms had worsened. She develop pain radiating from left buttock down to the left anterior thigh. She saw Dr. Tonna Boehringer in [place]- did a spinal adjustment.  She has tried biofreeze, ibuprofen, diclofenac, aleve.    She is past due for follow up of her chronic medical problems.    HTN- did not take lisinopril-hctz this AM. Took other meds.   BP Readings from Last 3 Encounters:  09/04/14 156/88  05/06/14 130/84  10/31/13 138/86   Sugars have generally 110-120.   Lab Results  Component Value Date   HGBA1C 8.4* 05/06/2014   HGBA1C 7.6* 09/17/2013   HGBA1C 8.0* 12/13/2012   Lab Results  Component Value Date   MICROALBUR 3.7* 05/06/2014   LDLCALC 145* 05/06/2014   CREATININE 0.89 05/06/2014      Review of Systems See HPI  Past Medical History  Diagnosis Date  . Arthritis   . Diabetes mellitus, type 2   . Hypertension   . Hyperlipidemia     Social History   Social History  . Marital Status: Married    Spouse Name: N/A  . Number of Children: N/A  . Years of Education: N/A   Occupational History  . Not on file.   Social History Main Topics  . Smoking status: Never Smoker   . Smokeless tobacco: Never Used  . Alcohol Use: Yes  . Drug Use: No  . Sexual Activity: Not on file   Other Topics Concern  . Not on file   Social History Narrative    Past Surgical History  Procedure Laterality Date  . Abdominal hysterectomy  1989  . Laparoscopic gastric banding  2009  . Tonsillectomy  1979    Family History  Problem Relation Age of Onset  . Arthritis      paternal grandparents  . Hyperlipidemia Mother   . Stroke Mother     maternal grandmother  . Hypertension Mother   . Diabetes Mother   .  Hyperlipidemia Father     maternal/paternal grandparents  . Heart disease Father   . Hypertension Father   . Hypertension Paternal Grandmother     No Known Allergies  Current Outpatient Prescriptions on File Prior to Visit  Medication Sig Dispense Refill  . acetaminophen (TYLENOL) 500 MG tablet Take 1,500 mg by mouth daily as needed.    [name] amLODipine (NORVASC) 5 MG tablet TAKE 1 TABLET (5 MG TOTAL) BY MOUTH DAILY. 90 tablet 1  . atorvastatin (LIPITOR) 80 MG tablet Take 80 mg by mouth daily.    . diclofenac (VOLTAREN) 75 MG EC tablet Take 1 tablet (75 mg total) by mouth 2 (two) times daily as needed. 60 tablet 3  . Glucosamine 750 MG TABS Take 1 tablet by mouth 2 (two) times daily.    [name] labetalol (NORMODYNE) 200 MG tablet TAKE 2 TABLETS (400 MG TOTAL) BY MOUTH 2 TIMES A DAY 360 tablet 1  . lisinopril-hydrochlorothiazide (PRINZIDE,ZESTORETIC) 20-25 MG per tablet TAKE 2 TABLETS BY MOUTH DAILY. 180 tablet 1  . metFORMIN (GLUCOPHAGE) 1000 MG tablet Take 2 tablets (2,000 mg total) by mouth daily with breakfast. 60 tablet 1  . sitaGLIPtin (JANUVIA) 50 MG tablet Take 2 tablets (  100 mg total) by mouth daily. 60 tablet 1  . SitaGLIPtin-MetFORMIN HCl (JANUMET XR) 50-1000 MG TB24 Take 2 tablets by mouth daily. 60 tablet 5  . Vitamin D, Ergocalciferol, (DRISDOL) 50000 UNITS CAPS capsule Take 1 capsule (50,000 Units total) by mouth every 7 (seven) days. 12 capsule 0   No current facility-administered medications on file prior to visit.    BP 156/88 mmHg  Pulse 77  Temp(Src) 98.2 F (36.8 C) (Oral)  Resp 18  Ht 5\' 3"  (1.6 m)  Wt 269 lb 3.2 oz (122.108 kg)  BMI 47.70 kg/m2  SpO2 99%       Objective:   Physical Exam  Constitutional: She appears well-developed and well-nourished.  Cardiovascular: Normal rate, regular rhythm and normal heart sounds.   No murmur heard. Pulmonary/Chest: Effort normal and breath sounds normal. No respiratory distress. She has no wheezes.  Musculoskeletal: She  exhibits no edema.  Neurological:  Reflex Scores:      Patellar reflexes are 2+ on the right side and 2+ on the left side. Bilateral LE strength is 5/5  Skin: Skin is warm and dry.  Psychiatric: She has a normal mood and affect. Her behavior is normal. Judgment and thought content normal.          Assessment & Plan:

## 2014-09-05 LAB — VITAMIN D 25 HYDROXY (VIT D DEFICIENCY, FRACTURES): Vit D, 25-Hydroxy: 44 ng/mL (ref 30–100)

## 2014-09-07 ENCOUNTER — Telehealth: Payer: Self-pay | Admitting: Family

## 2014-09-07 DIAGNOSIS — E785 Hyperlipidemia, unspecified: Secondary | ICD-10-CM

## 2014-09-07 DIAGNOSIS — R002 Palpitations: Secondary | ICD-10-CM

## 2014-09-07 NOTE — Telephone Encounter (Signed)
Vit D looks good.  Change vit D to OTC supplement 3000units once daily. Sugar looks better but still above goal. Add invokana once daily. Cholesterol very high- has she been taking lipitor?  If so d/c and start crestor 20mg  once daily.  If not taking, resume lipitor.

## 2014-09-07 NOTE — Telephone Encounter (Signed)
Please contact pt and let herkow that I reviewed her EKG and it shows some variation of her hear rhythm.  This is unlikeley to be concerning, but since she has been  Having some palpitations, I would like her to meet with cardiology for an appointment.

## 2014-09-08 NOTE — Telephone Encounter (Signed)
Left message for pt to return my call.

## 2014-09-09 MED ORDER — CANAGLIFLOZIN 100 MG PO TABS: 100.0000 mg | ORAL_TABLET | Freq: Every day | ORAL | Status: DC

## 2014-09-09 NOTE — Telephone Encounter (Signed)
Sent mychart message re: below recommendations and left message on home # to check mychart account.

## 2014-09-15 NOTE — Telephone Encounter (Signed)
Melissa, please see mychart messages and advise?

## 2014-09-15 NOTE — Telephone Encounter (Addendum)
If she is willing to go on crestor, I would like her to start 20mg  once daily please- repeat flp in 6 weeks.

## 2014-09-16 MED ORDER — ROSUVASTATIN CALCIUM 20 MG PO TABS: 20.0000 mg | ORAL_TABLET | Freq: Every day | ORAL | Status: DC

## 2014-09-16 NOTE — Addendum Note (Signed)
Addended by: Kelle Darting A on: 09/16/2014 04:08 PM   Modules accepted: Orders

## 2014-09-16 NOTE — Telephone Encounter (Signed)
Left message on home # to check mychart message. Rx sent and future lab order entered.

## 2014-10-06 ENCOUNTER — Encounter: Payer: Self-pay | Admitting: *Deleted

## 2014-10-06 NOTE — Telephone Encounter (Signed)
Mailed letter °

## 2014-10-12 NOTE — Progress Notes (Signed)
HPI: 64 year old female for evaluation of palpitations. Nuclear study 2009 showed no ischemia or infarction and ejection fraction 70%. Echocardiogram 2009 showed vigorous LV function. There was moderate left ventricular hypertrophy. There was very mild aortic stenosis with a mean gradient of 12 mmHg. Moderate left atrial enlargement. Carotid Dopplers June 2010 showed 0-39% stenosis. Patient has dyspnea on exertion but no orthopnea, PND or pedal edema. She experiences palpitations described as her heart racing. No associated symptoms. When she feels stressed she feels a burning sensation in her left upper chest and shoulder. She does not have exertional chest pain.  Current Outpatient Prescriptions  Medication Sig Dispense Refill  . acetaminophen (TYLENOL) 500 MG tablet Take 1,500 mg by mouth daily as needed.    [name] amLODipine (NORVASC) 5 MG tablet TAKE 1 TABLET (5 MG TOTAL) BY MOUTH DAILY. 90 tablet 1  . canagliflozin (INVOKANA) 100 MG TABS tablet Take 1 tablet (100 mg total) by mouth daily. 30 tablet 3  . Cholecalciferol (VITAMIN D3) 3000 UNITS TABS Take 1 tablet by mouth daily.    . diclofenac (VOLTAREN) 75 MG EC tablet Take 1 tablet (75 mg total) by mouth 2 (two) times daily as needed. 60 tablet 3  . Glucosamine 750 MG TABS Take 1 tablet by mouth 2 (two) times daily.    [name] labetalol (NORMODYNE) 200 MG tablet TAKE 2 TABLETS (400 MG TOTAL) BY MOUTH 2 TIMES A DAY 360 tablet 1  . lisinopril-hydrochlorothiazide (PRINZIDE,ZESTORETIC) 20-25 MG per tablet TAKE 2 TABLETS BY MOUTH DAILY. 180 tablet 1  . metFORMIN (GLUCOPHAGE) 1000 MG tablet Take 2 tablets (2,000 mg total) by mouth daily with breakfast. 60 tablet 1  . rosuvastatin (CRESTOR) 20 MG tablet Take 1 tablet (20 mg total) by mouth daily. 30 tablet 1   No current facility-administered medications for this visit.    No Known Allergies   Past Medical History  Diagnosis Date  . Arthritis   . Diabetes mellitus, type 2 ([place])   . Hypertension    . Hyperlipidemia   . Aortic stenosis     Past Surgical History  Procedure Laterality Date  . Abdominal hysterectomy  1989  . Laparoscopic gastric banding  2009  . Tonsillectomy  1979    Social History   Social History  . Marital Status: Married    Spouse Name: N/A  . Number of Children: 5  . Years of Education: N/A   Occupational History  . Not on file.   Social History Main Topics  . Smoking status: Never Smoker   . Smokeless tobacco: Never Used  . Alcohol Use: 0.0 oz/week    0 Standard drinks or equivalent per week     Comment: Rare  . Drug Use: No  . Sexual Activity: Not on file   Other Topics Concern  . Not on file   Social History Narrative    Family History  Problem Relation Age of Onset  . Arthritis      paternal grandparents  . Hyperlipidemia Mother   . Stroke Mother     maternal grandmother  . Hypertension Mother   . Diabetes Mother   . Hyperlipidemia Father     maternal/paternal grandparents  . Heart disease Father     MI at age 3  . Hypertension Father   . Hypertension Paternal Grandmother   . CAD Brother     MI at age 62  . CAD Sister     ROS: no fevers or chills, productive  cough, hemoptysis, dysphasia, odynophagia, melena, hematochezia, dysuria, hematuria, rash, seizure activity, orthopnea, PND, pedal edema, claudication. Remaining systems are negative.  Physical Exam:   Blood pressure 132/82, pulse 69, height 5\' 3"  (1.6 m), weight 119.931 kg (264 lb 6.4 oz).  General:  Well developed/obese in NAD Skin warm/dry Patient not depressed No peripheral clubbing Back-normal HEENT-normal/normal eyelids Neck supple/normal carotid upstroke bilaterally; no bruits; no JVD; no thyromegaly chest - CTA/ normal expansion CV - RRR/normal S1 and S2; no rubs or gallops;  PMI nondisplaced, 2/6 systolic murmur left sternal border. S2 is not diminished. Abdomen -NT/ND, no HSM, no mass, + bowel sounds, no bruit 2+ femoral pulses, no bruits Ext-no  edema, chords, 2+ DP Neuro-grossly nonfocal  ECG 09/04/2014-sinus rhythm with PACs.

## 2014-10-14 ENCOUNTER — Encounter: Payer: Self-pay | Admitting: *Deleted

## 2014-10-14 ENCOUNTER — Ambulatory Visit (INDEPENDENT_AMBULATORY_CARE_PROVIDER_SITE_OTHER): Payer: Medicare Other | Admitting: Cardiology

## 2014-10-14 ENCOUNTER — Encounter: Payer: Self-pay | Admitting: Cardiology

## 2014-10-14 VITALS — BP 132/82 | HR 69 | Ht 63.0 in | Wt 264.4 lb

## 2014-10-14 DIAGNOSIS — R002 Palpitations: Secondary | ICD-10-CM | POA: Insufficient documentation

## 2014-10-14 DIAGNOSIS — R0600 Dyspnea, unspecified: Secondary | ICD-10-CM

## 2014-10-14 DIAGNOSIS — E785 Hyperlipidemia, unspecified: Secondary | ICD-10-CM | POA: Diagnosis not present

## 2014-10-14 DIAGNOSIS — R0602 Shortness of breath: Secondary | ICD-10-CM

## 2014-10-14 DIAGNOSIS — I359 Nonrheumatic aortic valve disorder, unspecified: Secondary | ICD-10-CM | POA: Diagnosis not present

## 2014-10-14 HISTORY — DX: Palpitations: R00.2

## 2014-10-14 NOTE — Assessment & Plan Note (Signed)
Patient is having daily palpitations.Plan 48 hour Holter monitor to further assess.

## 2014-10-14 NOTE — Assessment & Plan Note (Signed)
Discussed the importance of weight loss and exercise.

## 2014-10-14 NOTE — Assessment & Plan Note (Signed)
Continue statin. 

## 2014-10-14 NOTE — Assessment & Plan Note (Signed)
Patient has some dyspnea on exertion and a burning sensation in left clavicular/shoulder area. Multiple risk factors including strong family history of coronary disease. Plan Lexiscan nuclear study for risk stratification.

## 2014-10-14 NOTE — Assessment & Plan Note (Signed)
Blood pressure controlled. Continue present medications. 

## 2014-10-14 NOTE — Patient Instructions (Signed)
Medication Instructions:   NO CHANGE  Testing/Procedures:  Your physician has requested that you have an echocardiogram. Echocardiography is a painless test that uses sound waves to create images of your heart. It provides your doctor with information about the size and shape of your heart and how well your heart's chambers and valves are working. This procedure takes approximately one hour. There are no restrictions for this procedure.   Your physician has requested that you have a lexiscan myoview. For further information please visit [email or web address]. Please follow instruction sheet, as given.   Your physician has recommended that you wear a 48 HOUR holter monitor. Holter monitors are medical devices that record the heart's electrical activity. Doctors most often use these monitors to diagnose arrhythmias. Arrhythmias are problems with the speed or rhythm of the heartbeat. The monitor is a small, portable device. You can wear one while you do your normal daily activities. This is usually used to diagnose what is causing palpitations/syncope (passing out).    Follow-Up:  Your physician recommends that you schedule a follow-up appointment in: [place]

## 2014-10-14 NOTE — Assessment & Plan Note (Signed)
Mild aortic stenosis on previous echocardiogram. Plan repeat study.

## 2014-10-26 ENCOUNTER — Encounter: Payer: Self-pay | Admitting: Cardiology

## 2014-10-27 ENCOUNTER — Telehealth (HOSPITAL_COMMUNITY): Payer: Self-pay

## 2014-10-27 NOTE — Telephone Encounter (Signed)
Left message on voicemail in reference to upcoming appointment scheduled for 10-29-2014. Phone number given for a call back so details instructions can be given. Zeina Akkerman A   

## 2014-10-28 ENCOUNTER — Telehealth (HOSPITAL_COMMUNITY): Payer: Self-pay

## 2014-10-28 NOTE — Telephone Encounter (Signed)
Left message on voicemail in reference to upcoming appointment scheduled for 10-29-2014. Phone number given for a call back so details instructions can be given. [name]   

## 2014-10-28 NOTE — Telephone Encounter (Signed)
Erroneous encounter

## 2014-10-29 ENCOUNTER — Ambulatory Visit (HOSPITAL_BASED_OUTPATIENT_CLINIC_OR_DEPARTMENT_OTHER): Payer: Medicare Other

## 2014-10-29 ENCOUNTER — Ambulatory Visit (HOSPITAL_COMMUNITY): Payer: Medicare Other | Attending: Cardiovascular Disease

## 2014-10-29 ENCOUNTER — Other Ambulatory Visit: Payer: Self-pay

## 2014-10-29 DIAGNOSIS — R0600 Dyspnea, unspecified: Secondary | ICD-10-CM | POA: Diagnosis not present

## 2014-10-29 DIAGNOSIS — I071 Rheumatic tricuspid insufficiency: Secondary | ICD-10-CM | POA: Insufficient documentation

## 2014-10-29 DIAGNOSIS — I059 Rheumatic mitral valve disease, unspecified: Secondary | ICD-10-CM | POA: Insufficient documentation

## 2014-10-29 DIAGNOSIS — I517 Cardiomegaly: Secondary | ICD-10-CM | POA: Insufficient documentation

## 2014-10-29 DIAGNOSIS — I351 Nonrheumatic aortic (valve) insufficiency: Secondary | ICD-10-CM | POA: Insufficient documentation

## 2014-10-29 DIAGNOSIS — I359 Nonrheumatic aortic valve disorder, unspecified: Secondary | ICD-10-CM | POA: Insufficient documentation

## 2014-10-29 MED ORDER — TECHNETIUM TC 99M SESTAMIBI GENERIC - CARDIOLITE
32.1000 | Freq: Once | INTRAVENOUS | Status: AC | PRN
  Administered 2014-10-29: 32.1 via INTRAVENOUS

## 2014-10-29 MED ORDER — REGADENOSON 0.4 MG/5ML IV SOLN
0.4000 mg | Freq: Once | INTRAVENOUS | Status: AC
  Administered 2014-10-29: 0.4 mg via INTRAVENOUS

## 2014-10-30 ENCOUNTER — Ambulatory Visit (INDEPENDENT_AMBULATORY_CARE_PROVIDER_SITE_OTHER): Payer: Medicare Other

## 2014-10-30 ENCOUNTER — Encounter (HOSPITAL_COMMUNITY): Payer: Medicare Other

## 2014-10-30 ENCOUNTER — Other Ambulatory Visit (HOSPITAL_COMMUNITY): Payer: Medicare Other

## 2014-10-30 ENCOUNTER — Ambulatory Visit (HOSPITAL_COMMUNITY): Payer: Medicare Other | Attending: Cardiology

## 2014-10-30 DIAGNOSIS — R002 Palpitations: Secondary | ICD-10-CM | POA: Diagnosis not present

## 2014-10-30 DIAGNOSIS — R0989 Other specified symptoms and signs involving the circulatory and respiratory systems: Secondary | ICD-10-CM

## 2014-10-30 LAB — MYOCARDIAL PERFUSION IMAGING
CHL CUP NUCLEAR SDS: 15
CHL CUP NUCLEAR SRS: 2
CSEPPHR: 87 {beats}/min
LV sys vol: 33 mL
LVDIAVOL: 93 mL
RATE: 0.27
Rest HR: 71 {beats}/min
SSS: 17
TID: 0.96

## 2014-10-30 MED ORDER — TECHNETIUM TC 99M SESTAMIBI GENERIC - CARDIOLITE
31.8000 | Freq: Once | INTRAVENOUS | Status: AC | PRN
  Administered 2014-10-30: 31.8 via INTRAVENOUS

## 2014-11-09 NOTE — Progress Notes (Signed)
      HPI: FU palpitations. Carotid Dopplers June 2010 showed 0-39% stenosis. Nuclear study October 2016 showed ejection fraction 65%. There was an anteroseptal, inferior septal defect suggestive of ischemia but could also be shifting breast attenuation. There is also ischemia in the inferior lateral and apical wall. Echo 10/16 showed nroaml LV function, grade 1 diastolic dysfunction, mild to moderate AI, mild LAE. Holter monitor 10/16 showed sinus with pacs and pvcs. Since last seen, She has mild dyspnea on exertion. Occasional chest pain with climbing stairs. No syncope.  Current Outpatient Prescriptions  Medication Sig Dispense Refill  . acetaminophen (TYLENOL) 500 MG tablet Take 1,500 mg by mouth daily as needed.    [name] amLODipine (NORVASC) 5 MG tablet TAKE 1 TABLET (5 MG TOTAL) BY MOUTH DAILY. 90 tablet 1  . canagliflozin (INVOKANA) 100 MG TABS tablet Take 1 tablet (100 mg total) by mouth daily. 30 tablet 3  . Cholecalciferol (VITAMIN D3) 3000 UNITS TABS Take 1 tablet by mouth daily.    . diclofenac (VOLTAREN) 75 MG EC tablet Take 1 tablet (75 mg total) by mouth 2 (two) times daily as needed. 60 tablet 3  . Glucosamine 750 MG TABS Take 1 tablet by mouth 2 (two) times daily.    [name] labetalol (NORMODYNE) 200 MG tablet TAKE 2 TABLETS (400 MG TOTAL) BY MOUTH 2 TIMES A DAY 360 tablet 1  . lisinopril-hydrochlorothiazide (PRINZIDE,ZESTORETIC) 20-25 MG per tablet TAKE 2 TABLETS BY MOUTH DAILY. 180 tablet 1  . metFORMIN (GLUCOPHAGE) 1000 MG tablet Take 2 tablets (2,000 mg total) by mouth daily with breakfast. 60 tablet 1  . rosuvastatin (CRESTOR) 20 MG tablet Take 1 tablet (20 mg total) by mouth daily. 30 tablet 1   No current facility-administered medications for this visit.     Past Medical History  Diagnosis Date  . Arthritis   . Diabetes mellitus, type 2 ([place])   . Hypertension   . Hyperlipidemia   . Aortic stenosis     Past Surgical History  Procedure Laterality Date  . Abdominal  hysterectomy  1989  . Laparoscopic gastric banding  2009  . Tonsillectomy  1979    Social History   Social History  . Marital Status: Married    Spouse Name: N/A  . Number of Children: 5  . Years of Education: N/A   Occupational History  . Not on file.   Social History Main Topics  . Smoking status: Never Smoker   . Smokeless tobacco: Never Used  . Alcohol Use: 0.0 oz/week    0 Standard drinks or equivalent per week     Comment: Rare  . Drug Use: No  . Sexual Activity: Not on file   Other Topics Concern  . Not on file   Social History Narrative    ROS: no fevers or chills, productive cough, hemoptysis, dysphasia, odynophagia, melena, hematochezia, dysuria, hematuria, rash, seizure activity, orthopnea, PND, pedal edema, claudication. Remaining systems are negative.  Physical Exam: Well-developed obese in no acute distress.  Skin is warm and dry.  HEENT is normal.  Neck is supple.  Chest is clear to auscultation with normal expansion.  Cardiovascular exam is regular rate and rhythm.  Abdominal exam nontender or distended. No masses palpated. Extremities show no edema. neuro grossly intact

## 2014-11-13 ENCOUNTER — Telehealth: Payer: Self-pay

## 2014-11-13 NOTE — Telephone Encounter (Signed)
Left message for patient to call back and schedule Annual wellness visit

## 2014-11-16 ENCOUNTER — Encounter: Payer: Self-pay | Admitting: Cardiology

## 2014-11-16 ENCOUNTER — Ambulatory Visit (INDEPENDENT_AMBULATORY_CARE_PROVIDER_SITE_OTHER): Payer: Medicare Other | Admitting: Cardiology

## 2014-11-16 VITALS — BP 180/90 | HR 78 | Ht 64.0 in | Wt 264.0 lb

## 2014-11-16 DIAGNOSIS — R0602 Shortness of breath: Secondary | ICD-10-CM | POA: Diagnosis not present

## 2014-11-16 DIAGNOSIS — I10 Essential (primary) hypertension: Secondary | ICD-10-CM | POA: Diagnosis not present

## 2014-11-16 DIAGNOSIS — E785 Hyperlipidemia, unspecified: Secondary | ICD-10-CM | POA: Diagnosis not present

## 2014-11-16 DIAGNOSIS — R002 Palpitations: Secondary | ICD-10-CM | POA: Diagnosis not present

## 2014-11-16 NOTE — Assessment & Plan Note (Signed)
Mild to moderate aortic insufficiency on recent echocardiogram. She will need follow-up study in the future.

## 2014-11-16 NOTE — Assessment & Plan Note (Signed)
Blood pressure elevated but she has not taken her medications this morning. We will continue present medications and follow.

## 2014-11-16 NOTE — Assessment & Plan Note (Signed)
Monitor shows PACs and PVCs. She did have palpitations with a monitor in place. Continue beta blocker.

## 2014-11-16 NOTE — Patient Instructions (Signed)
Medication Instructions:   START ASPIRIN 81 MG ONCE DAILY  Testing/Procedures:  Your physician has requested that you have a cardiac catheterization. Cardiac catheterization is used to diagnose and/or treat various heart conditions. Doctors may recommend this procedure for a number of different reasons. The most common reason is to evaluate chest pain. Chest pain can be a symptom of coronary artery disease (CAD), and cardiac catheterization can show whether plaque is narrowing or blocking your heart's arteries. This procedure is also used to evaluate the valves, as well as measure the blood flow and oxygen levels in different parts of your heart. For further information please visit [email or web address]. Please follow instruction sheet, as given.    Follow-Up:  Your physician recommends that you schedule a follow-up appointment in: [place]   If you need a refill on your cardiac medications before your next appointment, please call your pharmacy.

## 2014-11-16 NOTE — Assessment & Plan Note (Signed)
Patient continues with dyspnea on exertion and occasional chest pain. Nuclear study abnormal. Plan cardiac catheterization for definitive evaluation. The risks and benefits were discussed and she agrees to proceed. Add enteric-coated aspirin 81 mg daily. Hold Glucophage for 48 hours following procedure.

## 2014-11-16 NOTE — Assessment & Plan Note (Signed)
Continue statin. 

## 2014-11-23 ENCOUNTER — Other Ambulatory Visit: Payer: Self-pay | Admitting: *Deleted

## 2014-11-23 ENCOUNTER — Telehealth: Payer: Self-pay | Admitting: *Deleted

## 2014-11-23 DIAGNOSIS — R9439 Abnormal result of other cardiovascular function study: Secondary | ICD-10-CM

## 2014-11-23 NOTE — Telephone Encounter (Signed)
Spoke with pt, she is scheduled for cath 11-27-14 @ 7:30 am  Patient voiced understanding of time and location. She is out of town and will have labs drawn the day of the procedure. Aware to hold metformin Friday, Saturday and Sunday.

## 2014-11-23 NOTE — Telephone Encounter (Signed)
-----   Message from Glyn Ade sent at 11/19/2014  9:48 AM EST ----- Contact: [phone] Please call,waiting to have Cath scheduled. Id not at the above phone number,please [phone].

## 2014-11-27 ENCOUNTER — Ambulatory Visit (HOSPITAL_COMMUNITY)
Admission: RE | Admit: 2014-11-27 | Discharge: 2014-11-27 | Disposition: A | Discharge status: Home / Self Care | Payer: Medicare Other | Source: Ambulatory Visit | Attending: Cardiovascular Disease | Admitting: Cardiovascular Disease

## 2014-11-27 ENCOUNTER — Encounter (HOSPITAL_COMMUNITY)
Admission: RE | Disposition: A | Discharge status: Home / Self Care | Payer: Medicare Other | Source: Ambulatory Visit | Attending: Cardiovascular Disease

## 2014-11-27 DIAGNOSIS — I25110 Atherosclerotic heart disease of native coronary artery with unstable angina pectoris: Secondary | ICD-10-CM | POA: Diagnosis not present

## 2014-11-27 DIAGNOSIS — Z9884 Bariatric surgery status: Secondary | ICD-10-CM | POA: Insufficient documentation

## 2014-11-27 DIAGNOSIS — E785 Hyperlipidemia, unspecified: Secondary | ICD-10-CM | POA: Insufficient documentation

## 2014-11-27 DIAGNOSIS — I10 Essential (primary) hypertension: Secondary | ICD-10-CM | POA: Insufficient documentation

## 2014-11-27 DIAGNOSIS — R072 Precordial pain: Secondary | ICD-10-CM | POA: Diagnosis present

## 2014-11-27 DIAGNOSIS — Z7984 Long term (current) use of oral hypoglycemic drugs: Secondary | ICD-10-CM | POA: Diagnosis not present

## 2014-11-27 DIAGNOSIS — R9439 Abnormal result of other cardiovascular function study: Secondary | ICD-10-CM | POA: Insufficient documentation

## 2014-11-27 DIAGNOSIS — M1990 Unspecified osteoarthritis, unspecified site: Secondary | ICD-10-CM | POA: Diagnosis not present

## 2014-11-27 DIAGNOSIS — E119 Type 2 diabetes mellitus without complications: Secondary | ICD-10-CM | POA: Diagnosis not present

## 2014-11-27 DIAGNOSIS — I350 Nonrheumatic aortic (valve) stenosis: Secondary | ICD-10-CM | POA: Insufficient documentation

## 2014-11-27 DIAGNOSIS — I2510 Atherosclerotic heart disease of native coronary artery without angina pectoris: Secondary | ICD-10-CM

## 2014-11-27 HISTORY — PX: CARDIAC CATHETERIZATION: SHX172

## 2014-11-27 LAB — BASIC METABOLIC PANEL
ANION GAP: 8 (ref 5–15)
BUN: 22 mg/dL — ABNORMAL HIGH (ref 6–20)
CHLORIDE: 105 mmol/L (ref 101–111)
CO2: 27 mmol/L (ref 22–32)
Calcium: 9.1 mg/dL (ref 8.9–10.3)
Creatinine, Ser: 1.23 mg/dL — ABNORMAL HIGH (ref 0.44–1.00)
GFR calc non Af Amer: 45 mL/min — ABNORMAL LOW (ref >60)
GFR, EST AFRICAN AMERICAN: 52 mL/min — AB (ref >60)
GLUCOSE: 127 mg/dL — AB (ref 65–99)
Potassium: 3.9 mmol/L (ref 3.5–5.1)
Sodium: 140 mmol/L (ref 135–145)

## 2014-11-27 LAB — CBC
HCT: 34.7 % — ABNORMAL LOW (ref 36.0–46.0)
HEMOGLOBIN: 10.9 g/dL — AB (ref 12.0–15.0)
MCH: 22.6 pg — AB (ref 26.0–34.0)
MCHC: 31.4 g/dL (ref 30.0–36.0)
MCV: 72 fL — AB (ref 78.0–100.0)
Platelets: 221 10*3/uL (ref 150–400)
RBC: 4.82 MIL/uL (ref 3.87–5.11)
RDW: 16.6 % — ABNORMAL HIGH (ref 11.5–15.5)
WBC: 6.5 10*3/uL (ref 4.0–10.5)

## 2014-11-27 LAB — PROTIME-INR
INR: 1.01 (ref 0.00–1.49)
Prothrombin Time: 13.5 seconds (ref 11.6–15.2)

## 2014-11-27 LAB — GLUCOSE, CAPILLARY: Glucose-Capillary: 133 mg/dL — ABNORMAL HIGH (ref 65–99)

## 2014-11-27 SURGERY — LEFT HEART CATH AND CORONARY ANGIOGRAPHY

## 2014-11-27 MED ORDER — SODIUM CHLORIDE 0.9 % IV SOLN: 250.0000 mL | INTRAVENOUS | Status: DC | PRN

## 2014-11-27 MED ORDER — HEPARIN (PORCINE) IN NACL 2-0.9 UNIT/ML-% IJ SOLN
INTRAMUSCULAR | Status: AC
  Filled 2014-11-27: qty 500

## 2014-11-27 MED ORDER — LIDOCAINE HCL (PF) 1 % IJ SOLN
INTRAMUSCULAR | Status: AC
Start: 2014-11-27 — End: 2014-11-27
  Filled 2014-11-27: qty 30

## 2014-11-27 MED ORDER — HEPARIN SODIUM (PORCINE) 1000 UNIT/ML IJ SOLN
INTRAMUSCULAR | Status: DC | PRN
  Administered 2014-11-27: 6000 [IU] via INTRAVENOUS

## 2014-11-27 MED ORDER — IOHEXOL 350 MG/ML SOLN
INTRAVENOUS | Status: DC | PRN
  Administered 2014-11-27: 90 mL via INTRA_ARTERIAL

## 2014-11-27 MED ORDER — SODIUM CHLORIDE 0.9 % IV SOLN: INTRAVENOUS | Status: AC

## 2014-11-27 MED ORDER — LIDOCAINE HCL (PF) 1 % IJ SOLN
INTRAMUSCULAR | Status: DC | PRN
  Administered 2014-11-27: 5 mL via INTRADERMAL

## 2014-11-27 MED ORDER — SODIUM CHLORIDE 0.9 % WEIGHT BASED INFUSION
3.0000 mL/kg/h | INTRAVENOUS | Status: DC
  Administered 2014-11-27: 3 mL/kg/h via INTRAVENOUS

## 2014-11-27 MED ORDER — MIDAZOLAM HCL 2 MG/2ML IJ SOLN
INTRAMUSCULAR | Status: DC | PRN
  Administered 2014-11-27: 2 mg via INTRAVENOUS

## 2014-11-27 MED ORDER — FENTANYL CITRATE (PF) 100 MCG/2ML IJ SOLN
INTRAMUSCULAR | Status: DC | PRN
  Administered 2014-11-27: 25 ug via INTRAVENOUS

## 2014-11-27 MED ORDER — FENTANYL CITRATE (PF) 100 MCG/2ML IJ SOLN
INTRAMUSCULAR | Status: AC
  Filled 2014-11-27: qty 2

## 2014-11-27 MED ORDER — SODIUM CHLORIDE 0.9 % IJ SOLN: 3.0000 mL | INTRAMUSCULAR | Status: DC | PRN

## 2014-11-27 MED ORDER — SODIUM CHLORIDE 0.9 % WEIGHT BASED INFUSION: 1.0000 mL/kg/h | INTRAVENOUS | Status: DC

## 2014-11-27 MED ORDER — VERAPAMIL HCL 2.5 MG/ML IV SOLN
INTRAVENOUS | Status: DC | PRN
  Administered 2014-11-27: 08:00:00 via INTRA_ARTERIAL

## 2014-11-27 MED ORDER — VERAPAMIL HCL 2.5 MG/ML IV SOLN
INTRAVENOUS | Status: AC
  Filled 2014-11-27: qty 2

## 2014-11-27 MED ORDER — HEPARIN (PORCINE) IN NACL 2-0.9 UNIT/ML-% IJ SOLN
INTRAMUSCULAR | Status: DC | PRN
  Administered 2014-11-27: 08:00:00

## 2014-11-27 MED ORDER — SODIUM CHLORIDE 0.9 % IJ SOLN: 3.0000 mL | Freq: Two times a day (BID) | INTRAMUSCULAR | Status: DC

## 2014-11-27 MED ORDER — HEPARIN SODIUM (PORCINE) 1000 UNIT/ML IJ SOLN
INTRAMUSCULAR | Status: AC
  Filled 2014-11-27: qty 1

## 2014-11-27 MED ORDER — MIDAZOLAM HCL 2 MG/2ML IJ SOLN
INTRAMUSCULAR | Status: AC
  Filled 2014-11-27: qty 2

## 2014-11-27 SURGICAL SUPPLY — 12 items
CATH INFINITI 5 FR JL3.5 (CATHETERS) ×2 IMPLANT
CATH INFINITI 5FR AL1 (CATHETERS) ×1 IMPLANT
CATH INFINITI 5FR ANG PIGTAIL (CATHETERS) ×2 IMPLANT
CATH INFINITI JR4 5F (CATHETERS) ×2 IMPLANT
DEVICE RAD COMP TR BAND LRG (VASCULAR PRODUCTS) ×2 IMPLANT
GLIDESHEATH SLEND SS 6F .021 (SHEATH) ×2 IMPLANT
KIT HEART LEFT (KITS) ×2 IMPLANT
PACK CARDIAC CATHETERIZATION (CUSTOM PROCEDURE TRAY) ×2 IMPLANT
SYR MEDRAD MARK V 150ML (SYRINGE) ×2 IMPLANT
TRANSDUCER W/STOPCOCK (MISCELLANEOUS) ×2 IMPLANT
TUBING CIL FLEX 10 FLL-RA (TUBING) ×2 IMPLANT
WIRE SAFE-T 1.5MM-J .035X260CM (WIRE) ×2 IMPLANT

## 2014-11-27 NOTE — Interval H&P Note (Signed)
History and Physical Interval Note:  11/27/2014 7:32 AM  Virginia Flores  has presented today for cardiac cath with the diagnosis of unstable angina, abnormal stress test. The various methods of treatment have been discussed with the patient and family. After consideration of risks, benefits and other options for treatment, the patient has consented to  Procedure(s): Left Heart Cath and Coronary Angiography (N/A) as a surgical intervention .  The patient's history has been reviewed, patient examined, no change in status, stable for surgery.  I have reviewed the patient's chart and labs.  Questions were answered to the patient's satisfaction.    Cath Lab Visit (complete for each Cath Lab visit)  Clinical Evaluation Leading to the Procedure:   ACS: No.  Non-ACS:    Anginal Classification: CCS II  Anti-ischemic medical therapy: Maximal Therapy (2 or more classes of medications)  Non-Invasive Test Results: Intermediate-risk stress test findings: cardiac mortality 1-3%/year  Prior CABG: No previous CABG         Virginia Flores

## 2014-11-27 NOTE — H&P (View-Only) (Signed)
      HPI: FU palpitations. Carotid Dopplers June 2010 showed 0-39% stenosis. Nuclear study October 2016 showed ejection fraction 65%. There was an anteroseptal, inferior septal defect suggestive of ischemia but could also be shifting breast attenuation. There is also ischemia in the inferior lateral and apical wall. Echo 10/16 showed nroaml LV function, grade 1 diastolic dysfunction, mild to moderate AI, mild LAE. Holter monitor 10/16 showed sinus with pacs and pvcs. Since last seen, She has mild dyspnea on exertion. Occasional chest pain with climbing stairs. No syncope.  Current Outpatient Prescriptions  Medication Sig Dispense Refill  . acetaminophen (TYLENOL) 500 MG tablet Take 1,500 mg by mouth daily as needed.    . amLODipine (NORVASC) 5 MG tablet TAKE 1 TABLET (5 MG TOTAL) BY MOUTH DAILY. 90 tablet 1  . canagliflozin (INVOKANA) 100 MG TABS tablet Take 1 tablet (100 mg total) by mouth daily. 30 tablet 3  . Cholecalciferol (VITAMIN D3) 3000 UNITS TABS Take 1 tablet by mouth daily.    . diclofenac (VOLTAREN) 75 MG EC tablet Take 1 tablet (75 mg total) by mouth 2 (two) times daily as needed. 60 tablet 3  . Glucosamine 750 MG TABS Take 1 tablet by mouth 2 (two) times daily.    . labetalol (NORMODYNE) 200 MG tablet TAKE 2 TABLETS (400 MG TOTAL) BY MOUTH 2 TIMES A DAY 360 tablet 1  . lisinopril-hydrochlorothiazide (PRINZIDE,ZESTORETIC) 20-25 MG per tablet TAKE 2 TABLETS BY MOUTH DAILY. 180 tablet 1  . metFORMIN (GLUCOPHAGE) 1000 MG tablet Take 2 tablets (2,000 mg total) by mouth daily with breakfast. 60 tablet 1  . rosuvastatin (CRESTOR) 20 MG tablet Take 1 tablet (20 mg total) by mouth daily. 30 tablet 1   No current facility-administered medications for this visit.     Past Medical History  Diagnosis Date  . Arthritis   . Diabetes mellitus, type 2 (HCC)   . Hypertension   . Hyperlipidemia   . Aortic stenosis     Past Surgical History  Procedure Laterality Date  . Abdominal  hysterectomy  1989  . Laparoscopic gastric banding  2009  . Tonsillectomy  1979    Social History   Social History  . Marital Status: Married    Spouse Name: N/A  . Number of Children: 5  . Years of Education: N/A   Occupational History  . Not on file.   Social History Main Topics  . Smoking status: Never Smoker   . Smokeless tobacco: Never Used  . Alcohol Use: 0.0 oz/week    0 Standard drinks or equivalent per week     Comment: Rare  . Drug Use: No  . Sexual Activity: Not on file   Other Topics Concern  . Not on file   Social History Narrative    ROS: no fevers or chills, productive cough, hemoptysis, dysphasia, odynophagia, melena, hematochezia, dysuria, hematuria, rash, seizure activity, orthopnea, PND, pedal edema, claudication. Remaining systems are negative.  Physical Exam: Well-developed obese in no acute distress.  Skin is warm and dry.  HEENT is normal.  Neck is supple.  Chest is clear to auscultation with normal expansion.  Cardiovascular exam is regular rate and rhythm.  Abdominal exam nontender or distended. No masses palpated. Extremities show no edema. neuro grossly intact       

## 2014-11-27 NOTE — Discharge Instructions (Signed)
Hold metformin for 48 hours post cath.  Radial Site Care Refer to this sheet in the next few weeks. These instructions provide you with information about caring for yourself after your procedure. Your health care provider may also give you more specific instructions. Your treatment has been planned according to current medical practices, but problems sometimes occur. Call your health care provider if you have any problems or questions after your procedure. WHAT TO EXPECT AFTER THE PROCEDURE After your procedure, it is typical to have the following:  Bruising at the radial site that usually fades within 1-2 weeks.  Blood collecting in the tissue (hematoma) that may be painful to the touch. It should usually decrease in size and tenderness within 1-2 weeks. HOME CARE INSTRUCTIONS  Take medicines only as directed by your health care provider.  You may shower 24-48 hours after the procedure or as directed by your health care provider. Remove the bandage (dressing) and gently wash the site with plain soap and water. Pat the area dry with a clean towel. Do not rub the site, because this may cause bleeding.  Do not take baths, swim, or use a hot tub until your health care provider approves.  Check your insertion site every day for redness, swelling, or drainage.  Do not apply powder or lotion to the site.  Do not flex or bend the affected arm for 24 hours or as directed by your health care provider.  Do not push or pull heavy objects with the affected arm for 24 hours or as directed by your health care provider.  Do not lift over 10 lb (4.5 kg) for 5 days after your procedure or as directed by your health care provider.  Ask your health care provider when it is okay to:  Return to work or school.  Resume usual physical activities or sports.  Resume sexual activity.  Do not drive home if you are discharged the same day as the procedure. Have someone else drive you.  You may drive 24  hours after the procedure unless otherwise instructed by your health care provider.  Do not operate machinery or power tools for 24 hours after the procedure.  If your procedure was done as an outpatient procedure, which means that you went home the same day as your procedure, a responsible adult should be with you for the first 24 hours after you arrive home.  Keep all follow-up visits as directed by your health care provider. This is important. SEEK MEDICAL CARE IF:  You have a fever.  You have chills.  You have increased bleeding from the radial site. Hold pressure on the site. SEEK IMMEDIATE MEDICAL CARE IF:  You have unusual pain at the radial site.  You have redness, warmth, or swelling at the radial site.  You have drainage (other than a small amount of blood on the dressing) from the radial site.  The radial site is bleeding, and the bleeding does not stop after 30 minutes of holding steady pressure on the site.  Your arm or hand becomes pale, cool, tingly, or numb.   This information is not intended to replace advice given to you by your health care provider. Make sure you discuss any questions you have with your health care provider.   Document Released: 01/21/2010 Document Revised: 01/09/2014 Document Reviewed: 07/07/2013 Elsevier Interactive Patient Education [employer].

## 2014-11-28 DIAGNOSIS — I25110 Atherosclerotic heart disease of native coronary artery with unstable angina pectoris: Secondary | ICD-10-CM | POA: Diagnosis not present

## 2014-11-30 ENCOUNTER — Encounter (HOSPITAL_COMMUNITY): Payer: Self-pay | Admitting: Cardiovascular Disease

## 2014-11-30 MED FILL — Heparin Sodium (Porcine) 2 Unit/ML in Sodium Chloride 0.9%: INTRAMUSCULAR | Qty: 500 | Status: AC

## 2014-12-03 ENCOUNTER — Telehealth: Payer: Self-pay

## 2014-12-03 NOTE — Telephone Encounter (Signed)
Left message to schedule annual wellness visit

## 2015-01-01 ENCOUNTER — Other Ambulatory Visit: Payer: Self-pay | Admitting: Family

## 2015-01-11 NOTE — Progress Notes (Signed)
HPI: FU palpitations. Carotid Dopplers June 2010 showed 0-39% stenosis. Nuclear study October 2016 showed ejection fraction 65%. There was an anteroseptal, inferior septal defect suggestive of ischemia but could also be shifting breast attenuation. There is also ischemia in the inferior lateral and apical wall. Echo 10/16 showed normal LV function, grade 1 diastolic dysfunction, mild to moderate AI, mild LAE. Holter monitor 10/16 showed sinus with pacs and pvcs. Cath 11/16 showed normal LV function and nonobstructive CAD. Since last seen,   Current Outpatient Prescriptions  Medication Sig Dispense Refill  . acetaminophen (TYLENOL) 500 MG tablet Take 1,500 mg by mouth daily as needed for mild pain.     [name] amLODipine (NORVASC) 5 MG tablet TAKE 1 TABLET (5 MG TOTAL) BY MOUTH DAILY. 90 tablet 1  . aspirin 81 MG tablet Take 81 mg by mouth daily.    . canagliflozin (INVOKANA) 100 MG TABS tablet Take 1 tablet (100 mg total) by mouth daily. 30 tablet 3  . Cholecalciferol (VITAMIN D3) 3000 UNITS TABS Take 1 tablet by mouth daily.    . diclofenac (VOLTAREN) 75 MG EC tablet Take 1 tablet (75 mg total) by mouth 2 (two) times daily as needed. (Patient taking differently: Take 75 mg by mouth 2 (two) times daily as needed for mild pain. ) 60 tablet 3  . Glucosamine 750 MG TABS Take 1 tablet by mouth 2 (two) times daily.    [name] labetalol (NORMODYNE) 200 MG tablet Take 2 tablets (400 mg total) by mouth 2 (two) times daily. 120 tablet 0  . lisinopril-hydrochlorothiazide (PRINZIDE,ZESTORETIC) 20-25 MG per tablet TAKE 2 TABLETS BY MOUTH DAILY. 180 tablet 1  . pseudoephedrine (SUDAFED) 30 MG tablet Take 30 mg by mouth every 4 (four) hours as needed for congestion.    . rosuvastatin (CRESTOR) 20 MG tablet Take 1 tablet (20 mg total) by mouth daily. 30 tablet 1   No current facility-administered medications for this visit.     Past Medical History  Diagnosis Date  . Arthritis   . Diabetes mellitus, type 2  ([place])   . Hypertension   . Hyperlipidemia   . Aortic stenosis     Past Surgical History  Procedure Laterality Date  . Abdominal hysterectomy  1989  . Laparoscopic gastric banding  2009  . Tonsillectomy  1979  . Cardiac catheterization N/A 11/27/2014    Procedure: Left Heart Cath and Coronary Angiography;  Surgeon: Burnell Blanks, MD;  Location: [facility];  Service: Cardiovascular;  Laterality: N/A;    Social History   Social History  . Marital Status: Married    Spouse Name: N/A  . Number of Children: 5  . Years of Education: N/A   Occupational History  . Not on file.   Social History Main Topics  . Smoking status: Never Smoker   . Smokeless tobacco: Never Used  . Alcohol Use: 0.0 oz/week    0 Standard drinks or equivalent per week     Comment: Rare  . Drug Use: No  . Sexual Activity: Not on file   Other Topics Concern  . Not on file   Social History Narrative    Family History  Problem Relation Age of Onset  . Arthritis      paternal grandparents  . Hyperlipidemia Mother   . Stroke Mother     maternal grandmother  . Hypertension Mother   . Diabetes Mother   . Hyperlipidemia Father     maternal/paternal grandparents  .  Heart disease Father     MI at age 69  . Hypertension Father   . Hypertension Paternal Grandmother   . CAD Brother     MI at age 40  . CAD Sister     ROS: no fevers or chills, productive cough, hemoptysis, dysphasia, odynophagia, melena, hematochezia, dysuria, hematuria, rash, seizure activity, orthopnea, PND, pedal edema, claudication. Remaining systems are negative.  Physical Exam: Well-developed well-nourished in no acute distress.  Skin is warm and dry.  HEENT is normal.  Neck is supple.  Chest is clear to auscultation with normal expansion.  Cardiovascular exam is regular rate and rhythm.  Abdominal exam nontender or distended. No masses palpated. Extremities show no edema. neuro grossly  intact  ECG     This encounter was created in error - please disregard.

## 2015-01-12 ENCOUNTER — Telehealth: Payer: Self-pay | Admitting: Family

## 2015-01-13 ENCOUNTER — Encounter: Payer: Medicare Other | Admitting: Cardiology

## 2015-01-27 NOTE — Telephone Encounter (Signed)
flu

## 2015-02-23 ENCOUNTER — Telehealth: Payer: Self-pay | Admitting: Family

## 2015-02-23 MED FILL — LISINOPRIL-HCTZ 20-25 MG TA: 20-25 | 14 days supply | Qty: 28 | Fill #0

## 2015-02-23 MED FILL — ROSUVASTATIN CALCIUM 20 MG: 20 | 14 days supply | Qty: 14 | Fill #0

## 2015-02-23 MED FILL — LABETALOL HCL 200 MG TABLET: 200 | 14 days supply | Qty: 56 | Fill #0

## 2015-02-23 NOTE — Telephone Encounter (Signed)
2 week supply sent for labetalol, lisinopril/hctz and crestor. Pt is past due for follow up and needs office visit before further refills will be given.  Please call pt to schedule appt.  Thanks!

## 2015-02-23 NOTE — Telephone Encounter (Signed)
Called pt. LVM advising of medication and also to schedule an FU appt

## 2015-02-26 MED FILL — DICLOFENAC SOD EC 75 MG TAB: 75 | 30 days supply | Qty: 60 | Fill #3

## 2015-03-12 MED ORDER — LABETALOL HCL 200 MG PO TABS: ORAL_TABLET | ORAL | Status: DC

## 2015-03-12 MED FILL — LABETALOL HCL 200 MG TABLET: 200 | 14 days supply | Qty: 56 | Fill #0

## 2015-03-12 NOTE — Telephone Encounter (Signed)
Patient was rescheduled to 03-26-2015 @745 ,  She needs labatilol 400 bid called in to [facility] since she will be out of meds prior to that appointment.   She is in the building now

## 2015-03-12 NOTE — Telephone Encounter (Signed)
Pt has been scheduled.  °

## 2015-03-12 NOTE — Telephone Encounter (Signed)
Pt r/s for 03/26/15 and another 2 week supply was sent to the pharmacy. Pt has been made aware by the front office staff that we will not be able to give her further refills and she voices understanding.

## 2015-03-16 ENCOUNTER — Telehealth: Payer: Self-pay | Admitting: Family

## 2015-03-16 NOTE — Telephone Encounter (Signed)
LVM for pt return call to update FLu shot info

## 2015-03-26 ENCOUNTER — Encounter: Payer: Self-pay | Admitting: Family

## 2015-03-26 ENCOUNTER — Telehealth: Payer: Self-pay | Admitting: Family

## 2015-03-26 ENCOUNTER — Ambulatory Visit (INDEPENDENT_AMBULATORY_CARE_PROVIDER_SITE_OTHER): Payer: Medicare Other | Admitting: Family

## 2015-03-26 VITALS — BP 139/67 | HR 66 | Temp 98.0°F | Resp 18 | Ht 63.0 in | Wt 278.8 lb

## 2015-03-26 DIAGNOSIS — I10 Essential (primary) hypertension: Secondary | ICD-10-CM

## 2015-03-26 DIAGNOSIS — E785 Hyperlipidemia, unspecified: Secondary | ICD-10-CM | POA: Diagnosis not present

## 2015-03-26 DIAGNOSIS — E1122 Type 2 diabetes mellitus with diabetic chronic kidney disease: Secondary | ICD-10-CM | POA: Diagnosis not present

## 2015-03-26 DIAGNOSIS — D649 Anemia, unspecified: Secondary | ICD-10-CM | POA: Diagnosis not present

## 2015-03-26 DIAGNOSIS — E1165 Type 2 diabetes mellitus with hyperglycemia: Secondary | ICD-10-CM

## 2015-03-26 DIAGNOSIS — Z0000 Encounter for general adult medical examination without abnormal findings: Secondary | ICD-10-CM

## 2015-03-26 DIAGNOSIS — M2550 Pain in unspecified joint: Secondary | ICD-10-CM | POA: Diagnosis not present

## 2015-03-26 LAB — BASIC METABOLIC PANEL
BUN: 22 mg/dL (ref 6–23)
CALCIUM: 9.2 mg/dL (ref 8.4–10.5)
CO2: 31 mEq/L (ref 19–32)
CREATININE: 0.95 mg/dL (ref 0.40–1.20)
Chloride: 103 mEq/L (ref 96–112)
GFR: 75.45 mL/min (ref >60.00)
GLUCOSE: 131 mg/dL — AB (ref 70–99)
Potassium: 4.1 mEq/L (ref 3.5–5.1)
SODIUM: 141 meq/L (ref 135–145)

## 2015-03-26 LAB — CBC WITH DIFFERENTIAL/PLATELET
BASOS ABS: 0 10*3/uL (ref 0.0–0.1)
Basophils Relative: 0.5 % (ref 0.0–3.0)
EOS ABS: 0.1 10*3/uL (ref 0.0–0.7)
Eosinophils Relative: 2 % (ref 0.0–5.0)
HEMATOCRIT: 36.5 % (ref 36.0–46.0)
HEMOGLOBIN: 11.6 g/dL — AB (ref 12.0–15.0)
LYMPHS PCT: 27.7 % (ref 12.0–46.0)
Lymphs Abs: 1.9 10*3/uL (ref 0.7–4.0)
MCHC: 31.7 g/dL (ref 30.0–36.0)
MCV: 71 fl — ABNORMAL LOW (ref 78.0–100.0)
MONO ABS: 0.6 10*3/uL (ref 0.1–1.0)
MONOS PCT: 9.4 % (ref 3.0–12.0)
Neutro Abs: 4.1 10*3/uL (ref 1.4–7.7)
Neutrophils Relative %: 60.4 % (ref 43.0–77.0)
PLATELETS: 264 10*3/uL (ref 150.0–400.0)
RBC: 5.15 Mil/uL — ABNORMAL HIGH (ref 3.87–5.11)
RDW: 16.4 % — ABNORMAL HIGH (ref 11.5–15.5)
WBC: 6.8 10*3/uL (ref 4.0–10.5)

## 2015-03-26 LAB — LIPID PANEL
CHOL/HDL RATIO: 5
Cholesterol: 245 mg/dL — ABNORMAL HIGH (ref 0–200)
HDL: 44.9 mg/dL (ref >39.00)
LDL Cholesterol: 189 mg/dL — ABNORMAL HIGH (ref 0–99)
NonHDL: 200.45
TRIGLYCERIDES: 56 mg/dL (ref 0.0–149.0)
VLDL: 11.2 mg/dL (ref 0.0–40.0)

## 2015-03-26 LAB — SEDIMENTATION RATE: Sed Rate: 17 mm/hr (ref 0–22)

## 2015-03-26 LAB — FERRITIN: FERRITIN: 17.6 ng/mL (ref 10.0–291.0)

## 2015-03-26 LAB — HEMOGLOBIN A1C: Hgb A1c MFr Bld: 8.1 % — ABNORMAL HIGH (ref 4.6–6.5)

## 2015-03-26 LAB — IRON: Iron: 45 ug/dL (ref 42–145)

## 2015-03-26 LAB — RHEUMATOID FACTOR: Rhuematoid fact SerPl-aCnc: <10 IU/mL (ref <=14)

## 2015-03-26 MED ORDER — AMLODIPINE BESYLATE 5 MG PO TABS: ORAL_TABLET | ORAL | Status: DC

## 2015-03-26 MED ORDER — TRAMADOL HCL 50 MG PO TABS: 50.0000 mg | ORAL_TABLET | Freq: Three times a day (TID) | ORAL | Status: DC | PRN

## 2015-03-26 MED ORDER — METFORMIN HCL ER (OSM) 1000 MG PO TB24
2000.0000 mg | ORAL_TABLET | Freq: Every day | ORAL | Status: DC
Start: 2015-03-26 — End: 2016-04-28

## 2015-03-26 MED ORDER — LISINOPRIL-HYDROCHLOROTHIAZIDE 20-25 MG PO TABS: 2.0000 | ORAL_TABLET | Freq: Every day | ORAL | Status: DC

## 2015-03-26 MED ORDER — PIOGLITAZONE HCL 30 MG PO TABS: 30.0000 mg | ORAL_TABLET | Freq: Every day | ORAL | Status: DC

## 2015-03-26 MED ORDER — LABETALOL HCL 200 MG PO TABS: ORAL_TABLET | ORAL | Status: DC

## 2015-03-26 MED ORDER — ROSUVASTATIN CALCIUM 40 MG PO TABS: 40.0000 mg | ORAL_TABLET | Freq: Every day | ORAL | Status: DC

## 2015-03-26 MED ORDER — ROSUVASTATIN CALCIUM 20 MG PO TABS: 20.0000 mg | ORAL_TABLET | Freq: Every day | ORAL | Status: DC

## 2015-03-26 MED FILL — traMADol HCL 50 MG TABS: 50 | 20 days supply | Qty: 60 | Fill #0

## 2015-03-26 MED FILL — AMLODIPINE BESYLATE 5 MG TA: 5 | 90 days supply | Qty: 90 | Fill #0

## 2015-03-26 MED FILL — ROSUVASTATIN CALCIUM 20 MG: 20 | 30 days supply | Qty: 30 | Fill #0

## 2015-03-26 MED FILL — PIOGLITAZONE HCL 30 MG TAB: 30 | 30 days supply | Qty: 30 | Fill #0

## 2015-03-26 MED FILL — LISINOPRIL-HCTZ 20-25 MG TA: 20-25 | 90 days supply | Qty: 180 | Fill #0

## 2015-03-26 MED FILL — LABETALOL HCL 200 MG TABLET: 200 | 90 days supply | Qty: 360 | Fill #0

## 2015-03-26 NOTE — Telephone Encounter (Signed)
Sugar is above goal. Begin Actos and metformin ER as we discussed at her appointment.  Cholesterol is extremely high. Increase crestor from 20mg  to 40mg .  Still anemic, but not as bad as before.  Please return IFOB (did we give her one today?) Iron is low normal. Add MVI with iron once daily.

## 2015-03-26 NOTE — Assessment & Plan Note (Signed)
BP stable on current meds. Continue same.  

## 2015-03-26 NOTE — Telephone Encounter (Signed)
Left message for pt to return my call.

## 2015-03-26 NOTE — Addendum Note (Signed)
Addended by: Kelle Darting A on: 03/26/2015 09:42 AM   Modules accepted: Orders

## 2015-03-26 NOTE — Assessment & Plan Note (Signed)
Reports that she tolerates extended release metformin better. Will change metformin to extended release.  invokana too expensive. Rx with actos.

## 2015-03-26 NOTE — Assessment & Plan Note (Signed)
Continue crestor, obtain lipid panel.

## 2015-03-26 NOTE — Progress Notes (Signed)
Subjective:    Patient ID: Virginia Flores, female    DOB: 08-14-1950, 65 y.o.   MRN: [id number]  HPI   Virginia Flores is a 65 yr old female who presents today for follow up of multiple medical problems:  1) DM2- current meds include invokana. She states she has not been taking due to cost. She has been on metformin in the past. It is not clear why metformin was discontinued. She reports that she has had sugars in the 160's. Lab Results  Component Value Date   HGBA1C 7.8* 09/04/2014   HGBA1C 8.4* 05/06/2014   HGBA1C 7.6* 09/17/2013   Lab Results  Component Value Date   MICROALBUR 3.7* 05/06/2014   LDLCALC 174* 09/04/2014   CREATININE 1.23* 11/27/2014   2) HTN- maintained on zestoretic, labetalol, amlodipine. Denies CP/SOB BP Readings from Last 3 Encounters:  11/27/14 130/74  11/16/14 180/90  10/14/14 132/82   3) Hyperlipidemia- on crestor 51m.  Lab Results  Component Value Date   CHOL 237* 09/04/2014   HDL 47 09/04/2014   LDLCALC 174* 09/04/2014   LDLDIRECT 161.2 02/02/2009   TRIG 78 09/04/2014   CHOLHDL 5.0 09/04/2014    4) Osteoarthritis-  Reports that she can't get out of the bed if she does not take voltaren.   Review of Systems See HPI  Past Medical History  Diagnosis Date  . Arthritis   . Diabetes mellitus, type 2 ([place]   . Hypertension   . Hyperlipidemia   . Aortic stenosis     Social History   Social History  . Marital Status: Married    Spouse Name: N/A  . Number of Children: 5  . Years of Education: N/A   Occupational History  . Not on file.   Social History Main Topics  . Smoking status: Never Smoker   . Smokeless tobacco: Never Used  . Alcohol Use: 0.0 oz/week    0 Standard drinks or equivalent per week     Comment: Rare  . Drug Use: No  . Sexual Activity: Not on file   Other Topics Concern  . Not on file   Social History Narrative    Past Surgical History  Procedure Laterality Date  . Abdominal hysterectomy  1989  .  Laparoscopic gastric banding  2009  . Tonsillectomy  1979  . Cardiac catheterization N/A 11/27/2014    Procedure: Left Heart Cath and Coronary Angiography;  Surgeon: CBurnell Blanks MD;  [facility];  Service: Cardiovascular;  Laterality: N/A;    Family History  Problem Relation Age of Onset  . Arthritis      paternal grandparents  . Hyperlipidemia Mother   . Stroke Mother     maternal grandmother  . Hypertension Mother   . Diabetes Mother   . Hyperlipidemia Father     maternal/paternal grandparents  . Heart disease Father     MI at age 67 . Hypertension Father   . Hypertension Paternal Grandmother   . CAD Brother     MI at age 94104 . CAD Sister     No Known Allergies  Current Outpatient Prescriptions on File Prior to Visit  Medication Sig Dispense Refill  . acetaminophen (TYLENOL) 500 MG tablet Take 1,500 mg by mouth daily as needed for mild pain.     .Marland KitchenamLODipine (NORVASC) 5 MG tablet TAKE 1 TABLET (5 MG TOTAL) BY MOUTH DAILY. 90 tablet 1  . Cholecalciferol (VITAMIN D3) 3000 UNITS TABS Take 1  tablet by mouth daily.    . diclofenac (VOLTAREN) 75 MG EC tablet Take 1 tablet (75 mg total) by mouth 2 (two) times daily as needed. (Patient taking differently: Take 75 mg by mouth 2 (two) times daily as needed for mild pain. ) 60 tablet 3  . labetalol (NORMODYNE) 200 MG tablet TAKE 2 TABLETS (400 MG TOTAL) BY MOUTH 2 (TWO) TIMES DAILY. *MAKE APPT FOR MORE REFILLS* 56 tablet 0  . lisinopril-hydrochlorothiazide (PRINZIDE,ZESTORETIC) 20-25 MG tablet TAKE 2 TABLETS BY MOUTH DAILY. 28 tablet 0  . rosuvastatin (CRESTOR) 20 MG tablet TAKE 1 TABLET BY MOUTH DAILY. 14 tablet 0  . canagliflozin (INVOKANA) 100 MG TABS tablet Take 1 tablet (100 mg total) by mouth daily. (Patient not taking: Reported on 03/26/2015) 30 tablet 3   No current facility-administered medications on file prior to visit.    Pulse 66  Temp(Src) 98 F (36.7 C) (Oral)  Resp 18  Ht '5\' 3"'  (1.6 m)   Wt 278 lb 12.8 oz (126.463 kg)  BMI 49.40 kg/m2  SpO2 99%       Objective:   Physical Exam  Constitutional: She is oriented to person, place, and time. She appears well-developed and well-nourished.  Cardiovascular: Normal rate, regular rhythm and normal heart sounds.   No murmur heard. Pulmonary/Chest: Effort normal and breath sounds normal. No respiratory distress. She has no wheezes.  Musculoskeletal: She exhibits no edema.  Neurological: She is alert and oriented to person, place, and time.  Skin: Skin is warm and dry.  Psychiatric: She has a normal mood and affect. Her behavior is normal. Judgment and thought content normal.          Assessment & Plan:  Anemia- noted on lab work from [facility] in November.  Microcytic, check iron levels. D/c Diclofenac in case she has gastritis/ulcer as contributing cause. She was also noted to have mild renal insufficiency at that time so that is another good reason to d/c diclofenac. Will have pt complete stool study (IFOB)  Joint pain- reports + family hx of RA, will obtain RA/ANA/ESR. Since we are d/cing voltaren, will rx tramadol. Controlled substance contract is signed and pt will complete UDS.

## 2015-03-26 NOTE — Progress Notes (Signed)
Pre visit review using our clinic review tool, if applicable. No additional management support is needed unless otherwise documented below in the visit note. 

## 2015-03-26 NOTE — Patient Instructions (Signed)
Please complete lab work prior to leaving. Stop Voltaren (Diclofenac). Start tramadol. If you need tylenol for breakthrough pain- ok to use tylenol but don't exceed 3000mg /day. Start Metformin ER 2000mg  once daily. Add Actos 30mg  once daily. Follow up in 3 months.

## 2015-03-29 ENCOUNTER — Telehealth: Payer: Self-pay | Admitting: Family

## 2015-03-29 DIAGNOSIS — R768 Other specified abnormal immunological findings in serum: Secondary | ICD-10-CM

## 2015-03-29 LAB — ANTI-NUCLEAR AB-TITER (ANA TITER): ANA Titer 1: 1:160 {titer} — ABNORMAL HIGH

## 2015-03-29 LAB — ANA: Anti Nuclear Antibody(ANA): POSITIVE — AB

## 2015-03-29 NOTE — Telephone Encounter (Signed)
Left message on pt's voice mail to return my call

## 2015-03-29 NOTE — Telephone Encounter (Signed)
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 Does not confirm diagnosis however.  I would like to refer her to Rheumatology for further evaluation of her joint pain and lab work.

## 2015-03-30 NOTE — Telephone Encounter (Signed)
I left 2 previous messages with pt re: other labs and she has not returned my calls. I will mail letter to pt. Signed rheumatology referral.

## 2015-03-30 NOTE — Telephone Encounter (Signed)
Mailed letter to pt

## 2015-04-30 MED FILL — PIOGLITAZONE HCL 30 MG TAB: 30 | 30 days supply | Qty: 30 | Fill #1

## 2015-04-30 MED FILL — ROSUVASTATIN CALCIUM 40 MG: 40 | 30 days supply | Qty: 30 | Fill #0

## 2015-05-26 ENCOUNTER — Telehealth: Payer: Self-pay

## 2015-05-26 NOTE — Telephone Encounter (Signed)
Patient is on my [employer] list for 2017. Patient has never had an AWV and may be a good candidate for an AWV.

## 2015-05-26 NOTE — Telephone Encounter (Signed)
LM for pt to call and schedule AWV (needs to be AWV &PE with provider as this is the 1st AWV pt will have under [facility])

## 2015-05-28 MED FILL — DICLOFENAC SOD EC 75 MG TAB: 75 | 30 days supply | Qty: 60 | Fill #0

## 2015-05-28 MED FILL — miSOPROStol 100 MCG TABS: 100 | 30 days supply | Qty: 60 | Fill #0

## 2015-06-01 MED FILL — FOLIC ACID 1 MG TABLET: 1 | 90 days supply | Qty: 90 | Fill #0

## 2015-06-01 MED FILL — METHOTREXATE 2.5 MG TABLET: 2.5 | 89 days supply | Qty: 48 | Fill #0

## 2015-06-06 ENCOUNTER — Encounter: Payer: Self-pay | Admitting: Family

## 2015-06-06 DIAGNOSIS — M069 Rheumatoid arthritis, unspecified: Secondary | ICD-10-CM

## 2015-06-06 HISTORY — DX: Rheumatoid arthritis, unspecified: M06.9

## 2015-06-24 ENCOUNTER — Telehealth: Payer: Self-pay | Admitting: Family

## 2015-06-24 NOTE — Telephone Encounter (Signed)
Please call pt and let her know that she is due for a 3 month follow up with Melissa now and she can bring order from rheumatologist with her to that appt.  Please call pt to schedule appt with Melissa soon.  Thanks!

## 2015-06-24 NOTE — Telephone Encounter (Signed)
°  Relationship to patient: Self Can be reached: [phone]   Reason for call: Request an order for CMP and CBC. States that the Rheumatologist is requesting labs

## 2015-06-25 MED FILL — LISINOPRIL-HCTZ 20-25 MG TA: 20-25 | 90 days supply | Qty: 180 | Fill #1

## 2015-06-25 MED FILL — AMLODIPINE BESYLATE 5 MG TA: 5 | 90 days supply | Qty: 90 | Fill #1

## 2015-06-25 MED FILL — LABETALOL HCL 200 MG TABLET: 200 | 90 days supply | Qty: 360 | Fill #1

## 2015-06-25 MED FILL — miSOPROStol 100 MCG TABS: 100 | 30 days supply | Qty: 60 | Fill #1

## 2015-06-25 NOTE — Telephone Encounter (Signed)
Left message informing patient of below. Advised patient to call office to schedule appointment with [facility]

## 2015-07-30 ENCOUNTER — Other Ambulatory Visit: Payer: Self-pay | Admitting: Family

## 2015-07-30 MED FILL — PIOGLITAZONE HCL 30 MG TAB: 30 | 30 days supply | Qty: 30 | Fill #2

## 2015-07-30 MED FILL — DICLOFENAC SOD EC 75 MG TAB: 75 | 30 days supply | Qty: 60 | Fill #0

## 2015-07-30 MED FILL — miSOPROStol 100 MCG TABS: 100 | 30 days supply | Qty: 60 | Fill #2

## 2015-07-30 MED FILL — ROSUVASTATIN CALCIUM 40 MG: 40 | 30 days supply | Qty: 30 | Fill #1

## 2015-08-23 ENCOUNTER — Encounter: Payer: Self-pay | Admitting: Gastroenterology

## 2015-08-27 MED FILL — METHOTREXATE 2.5 MG TABLET: 2.5 | 28 days supply | Qty: 16 | Fill #0

## 2015-09-14 ENCOUNTER — Telehealth: Payer: Self-pay | Admitting: Family

## 2015-09-14 NOTE — Telephone Encounter (Signed)
LM to schedule AWV with Melissa (never have been an AWV)

## 2015-09-24 ENCOUNTER — Inpatient Hospital Stay (HOSPITAL_BASED_OUTPATIENT_CLINIC_OR_DEPARTMENT_OTHER): Admission: RE | Admit: 2015-09-24 | Payer: Medicare Other | Source: Ambulatory Visit

## 2015-10-01 MED FILL — DICLOFENAC SOD 75 MG TAB EC: 75 | 30 days supply | Qty: 60 | Fill #1

## 2015-10-01 MED FILL — METHOTREXATE 2.5 MG TABLET: 2.5 | 28 days supply | Qty: 16 | Fill #1

## 2015-10-01 MED FILL — FOLIC ACID 1 MG TABLET: 1 | 90 days supply | Qty: 90 | Fill #0

## 2015-10-01 MED FILL — PIOGLITAZONE HCL 30 MG TAB: 30 | 30 days supply | Qty: 30 | Fill #3

## 2015-10-01 MED FILL — ROSUVASTATIN CALCIUM 40 MG: 40 | 30 days supply | Qty: 30 | Fill #2

## 2015-10-22 MED FILL — METHOTREXATE 2.5 MG TABLET: 2.5 | 28 days supply | Qty: 24 | Fill #0

## 2015-11-19 ENCOUNTER — Other Ambulatory Visit: Payer: Self-pay | Admitting: Family

## 2015-11-19 MED FILL — ROSUVASTATIN CALCIUM 40 MG: 40 | 30 days supply | Qty: 30 | Fill #3

## 2015-11-19 MED FILL — DICLOFENAC SOD 75 MG TAB EC: 75 | 30 days supply | Qty: 60 | Fill #0

## 2015-11-19 MED FILL — METHOTREXATE 2.5 MG TABLET: 2.5 | 30 days supply | Qty: 24 | Fill #0

## 2015-11-19 NOTE — Telephone Encounter (Signed)
Metformin and Actos refills denied as pt is past due for follow up. Last seen 03/26/15 and was due for follow up in June. Please call pt to schedule appt as soon as possible. Thanks!

## 2015-11-22 NOTE — Telephone Encounter (Signed)
Called pt. lvm with spouse to have pt call our office to schedule a follow up appt for medications.

## 2015-12-03 ENCOUNTER — Encounter: Payer: Self-pay | Admitting: Family Medicine

## 2015-12-03 ENCOUNTER — Ambulatory Visit (INDEPENDENT_AMBULATORY_CARE_PROVIDER_SITE_OTHER): Payer: Medicare Other | Admitting: Family Medicine

## 2015-12-03 VITALS — BP 188/110 | HR 75 | Ht 65.0 in | Wt 280.0 lb

## 2015-12-03 DIAGNOSIS — M25561 Pain in right knee: Secondary | ICD-10-CM | POA: Diagnosis not present

## 2015-12-03 DIAGNOSIS — M25562 Pain in left knee: Secondary | ICD-10-CM | POA: Diagnosis not present

## 2015-12-03 MED ORDER — METHYLPREDNISOLONE ACETATE 40 MG/ML IJ SUSP
40.0000 mg | Freq: Once | INTRAMUSCULAR | Status: AC
  Administered 2015-12-03: 40 mg via INTRA_ARTICULAR

## 2015-12-03 NOTE — Patient Instructions (Signed)
Your knee pain is due to arthritis. These are the different medications you can take for this: Tylenol 500mg  1-2 tabs three times a day for pain. Glucosamine sulfate 750mg  twice a day is a supplement that may help. Capsaicin, aspercreme, or biofreeze topically up to four times a day may also help with pain. Cortisone injections are an option - you were given these today. If cortisone injections do not help, there are different types of shots that may help but they take longer to take effect (gel shots). It's important that you continue to stay active. Straight leg raises, knee extensions 3 sets of 10 once a day (add ankle weight if these become too easy). Consider physical therapy to strengthen muscles around the joint that hurts to take pressure off of the joint itself. Shoe inserts with good arch support may be helpful. Walker or cane if needed. Heat or ice 15 minutes at a time 3-4 times a day as needed to help with pain. Water aerobics and cycling with low resistance are the best two types of exercise for arthritis. Follow up with me in 1 month but call me sooner if you are struggling - would consider the gel shots and/or orthopedic referral to discuss knee replacement surgery.

## 2015-12-07 NOTE — Assessment & Plan Note (Signed)
2/2 DJD.   Radiographs of left knee at prior visit 3 years ago showed advanced medial arthritis.  We discussed tylenol, glucosamine, topical medications.  Cortisone injections given today.  She will consider viscosupplementation if this doesn't help enough.  Heat/ice.  F/u in 1 month.  After informed written consent, patient was seated on exam table. Right knee was prepped with alcohol swab and utilizing anteromedial approach, patient's right knee was injected intraarticularly with 3:1 marcaine: depomedrol. Patient tolerated the procedure well without immediate complications.  After informed written consent, patient was seated on exam table. Left knee was prepped with alcohol swab and utilizing anteromedial approach, patient's left knee was injected intraarticularly with 3:1 marcaine: depomedrol. Patient tolerated the procedure well without immediate complications.

## 2015-12-07 NOTE — Progress Notes (Signed)
PCP: Nance Pear., NP  Subjective:   HPI: Patient is a 65 y.o. female here for bilateral knee pain.  Patient reports she has known lupus and RA diagnosed this March. She takes methotrexate, tylenol, diclofenac, cytotec (has tried all of these - reports GI bleeding with diclofenac). Has had problems past year with knees giving out, anteromedial pain. [place] [address] twice in November due to this. Pain is 10/10, sharp. Had improvement following knee injection 3 years ago. No skin changes, numbness.  Past Medical History:  Diagnosis Date  . Aortic stenosis   . Arthritis   . Diabetes mellitus, type 2 ([place])   . Hyperlipidemia   . Hypertension     Current Outpatient Prescriptions on File Prior to Visit  Medication Sig Dispense Refill  . acetaminophen (TYLENOL) 500 MG tablet Take 1,500 mg by mouth daily as needed for mild pain.     [name] amLODipine (NORVASC) 5 MG tablet TAKE 1 TABLET (5 MG TOTAL) BY MOUTH DAILY. 90 tablet 0  . Cholecalciferol (VITAMIN D3) 3000 UNITS TABS Take 1 tablet by mouth daily.    [name] labetalol (NORMODYNE) 200 MG tablet TAKE 2 TABLETS (400 MG TOTAL) BY MOUTH 2 (TWO) TIMES DAILY. *MAKE APPT FOR MORE REFILLS* 360 tablet 0  . lisinopril-hydrochlorothiazide (PRINZIDE,ZESTORETIC) 20-25 MG tablet TAKE 2 TABLETS BY MOUTH DAILY. 180 tablet 0  . metformin (FORTAMET) 1000 MG (OSM) 24 hr tablet Take 2 tablets (2,000 mg total) by mouth daily with breakfast. 60 tablet 2  . Multiple Vitamins-Minerals (MULTIVITAMIN WITH MINERALS) tablet Take 1 tablet by mouth daily.    . pioglitazone (ACTOS) 30 MG tablet Take 1 tablet (30 mg total) by mouth daily. 30 tablet 3  . rosuvastatin (CRESTOR) 40 MG tablet Take 1 tablet (40 mg total) by mouth daily. 30 tablet 5  . traMADol (ULTRAM) 50 MG tablet Take 1 tablet (50 mg total) by mouth every 8 (eight) hours as needed. 60 tablet 0   No current facility-administered medications on file prior to visit.     Past Surgical History:  Procedure  Laterality Date  . ABDOMINAL HYSTERECTOMY  1989  . CARDIAC CATHETERIZATION N/A 11/27/2014   Procedure: Left Heart Cath and Coronary Angiography;  Surgeon: Burnell Blanks, MD;  Location: [facility];  Service: Cardiovascular;  Laterality: N/A;  . LAPAROSCOPIC GASTRIC BANDING  2009  . TONSILLECTOMY  1979    Allergies  Allergen Reactions  . Aspirin     GI upset    Social History   Social History  . Marital status: Married    Spouse name: N/A  . Number of children: 5  . Years of education: N/A   Occupational History  . Not on file.   Social History Main Topics  . Smoking status: Never Smoker  . Smokeless tobacco: Never Used  . Alcohol use 0.0 oz/week     Comment: Rare  . Drug use: No  . Sexual activity: Not on file   Other Topics Concern  . Not on file   Social History Narrative  . No narrative on file    Family History  Problem Relation Age of Onset  . Arthritis      paternal grandparents  . Hyperlipidemia Mother   . Stroke Mother     maternal grandmother  . Hypertension Mother   . Diabetes Mother   . Hyperlipidemia Father     maternal/paternal grandparents  . Heart disease Father     MI at age 78  . Hypertension Father   .  Hypertension Paternal Grandmother   . CAD Brother     MI at age 16  . CAD Sister     BP (!) 188/110   Pulse 75   Ht 5\' 5"  (1.651 m)   Wt 280 lb (127 kg)   BMI 46.59 kg/m   Review of Systems: See HPI above.     Objective:  Physical Exam:  Gen: NAD, comfortable in exam room  Bilateral knees: No gross deformity, ecchymoses, swelling. TTP medial joint lines. FROM. Negative ant/post drawers. Negative valgus/varus testing. Negative lachmanns. Negative mcmurrays, apleys, patellar apprehension. NV intact distally.   Assessment & Plan:  1. Bilateral knee pain - 2/2 DJD.   Radiographs of left knee at prior visit 3 years ago showed advanced medial arthritis.  We discussed tylenol, glucosamine, topical  medications.  Cortisone injections given today.  She will consider viscosupplementation if this doesn't help enough.  Heat/ice.  F/u in 1 month.  After informed written consent, patient was seated on exam table. Right knee was prepped with alcohol swab and utilizing anteromedial approach, patient's right knee was injected intraarticularly with 3:1 marcaine: depomedrol. Patient tolerated the procedure well without immediate complications.  After informed written consent, patient was seated on exam table. Left knee was prepped with alcohol swab and utilizing anteromedial approach, patient's left knee was injected intraarticularly with 3:1 marcaine: depomedrol. Patient tolerated the procedure well without immediate complications.

## 2015-12-10 MED FILL — AMLODIPINE BESYLATE 5 MG TA: 5 | 90 days supply | Qty: 90 | Fill #0

## 2015-12-10 MED FILL — LISINOPRIL-HCTZ 20-25 MG TA: 20-25 | 90 days supply | Qty: 180 | Fill #0

## 2015-12-10 MED FILL — LABETALOL HCL 200 MG TABLET: 200 | 90 days supply | Qty: 360 | Fill #0

## 2015-12-17 MED FILL — METHOTREXATE 2.5 MG TABLET: 2.5 | 30 days supply | Qty: 24 | Fill #1

## 2016-01-11 ENCOUNTER — Telehealth: Payer: Self-pay | Admitting: Family

## 2016-01-11 NOTE — Telephone Encounter (Signed)
Called patient to schedule awv. Left msg for patient to call office to schedule a appt.

## 2016-01-12 ENCOUNTER — Telehealth: Payer: Self-pay | Admitting: Family Medicine

## 2016-01-12 NOTE — Telephone Encounter (Signed)
Ok to go ahead with viscosupplementation.  She may need x-rays on the right knee as she hasn't had these before getting approval.

## 2016-01-25 NOTE — Telephone Encounter (Signed)
Nevin Bloodgood - please check on if she needs x-rays before getting approval for viscosupplementation.  If she does go ahead and put order in.  She doesn't need to see me until after we have approval and the gel shots here for her.

## 2016-01-27 NOTE — Telephone Encounter (Signed)
Spoke to patient and she is coming in on Friday to start gel shots.

## 2016-01-28 ENCOUNTER — Encounter: Payer: Self-pay | Admitting: Family Medicine

## 2016-01-28 ENCOUNTER — Ambulatory Visit (INDEPENDENT_AMBULATORY_CARE_PROVIDER_SITE_OTHER): Payer: Medicare Other | Admitting: Family Medicine

## 2016-01-28 VITALS — BP 141/81 | HR 77 | Ht 63.0 in | Wt 270.0 lb

## 2016-01-28 DIAGNOSIS — M170 Bilateral primary osteoarthritis of knee: Secondary | ICD-10-CM | POA: Diagnosis not present

## 2016-01-28 MED ORDER — SODIUM HYALURONATE (VISCOSUP) 25 MG/2.5ML IX SOSY
2.5000 mL | PREFILLED_SYRINGE | Freq: Once | INTRA_ARTICULAR | Status: AC
  Administered 2016-01-28: 2.5 mL via INTRA_ARTICULAR

## 2016-01-29 NOTE — Assessment & Plan Note (Signed)
We discussed tylenol, glucosamine, topical medications.  S/p cortisone injections without much benefit.  Will go ahead with supartz series starting today.  Heat/ice.  F/u in 1 week for second injection.  After informed written consent, patient was seated on exam table. Right knee was prepped with alcohol swab and utilizing anteromedial approach, patient's right knee was injected intraarticularly with 58mL bupivicaine followed by supartz. Patient tolerated the procedure well without immediate complications.  After informed written consent, patient was seated on exam table. Left knee was prepped with alcohol swab and utilizing anteromedial approach, patient's left knee was injected intraarticularly with 53mL bupivicaine followed by supartz. Patient tolerated the procedure well without immediate complications.

## 2016-01-29 NOTE — Progress Notes (Signed)
PCP: Nance Pear., NP  Subjective:   HPI: Patient is a 66 y.o. female here for bilateral knee pain.  12/03/15: Patient reports she has known lupus and RA diagnosed this March. She takes methotrexate, tylenol, diclofenac, cytotec (has tried all of these - reports GI bleeding with diclofenac). Has had problems past year with knees giving out, anteromedial pain. [place] [address] twice in November due to this. Pain is 10/10, sharp. Had improvement following knee injection 3 years ago. No skin changes, numbness.  01/28/16: Patient returns to start supartz series.  Pain right knee 10/10, left 8/10. No skin changes, numbness.  Past Medical History:  Diagnosis Date  . Aortic stenosis   . Arthritis   . Diabetes mellitus, type 2 ([place])   . Hyperlipidemia   . Hypertension     Current Outpatient Prescriptions on File Prior to Visit  Medication Sig Dispense Refill  . acetaminophen (TYLENOL) 500 MG tablet Take 1,500 mg by mouth daily as needed for mild pain.     [name] amLODipine (NORVASC) 5 MG tablet TAKE 1 TABLET (5 MG TOTAL) BY MOUTH DAILY. 90 tablet 0  . Cholecalciferol (VITAMIN D3) 3000 UNITS TABS Take 1 tablet by mouth daily.    . diclofenac (VOLTAREN) 75 MG EC tablet     . labetalol (NORMODYNE) 200 MG tablet TAKE 2 TABLETS (400 MG TOTAL) BY MOUTH 2 (TWO) TIMES DAILY. *MAKE APPT FOR MORE REFILLS* 360 tablet 0  . lisinopril-hydrochlorothiazide (PRINZIDE,ZESTORETIC) 20-25 MG tablet TAKE 2 TABLETS BY MOUTH DAILY. 180 tablet 0  . metformin (FORTAMET) 1000 MG (OSM) 24 hr tablet Take 2 tablets (2,000 mg total) by mouth daily with breakfast. 60 tablet 2  . methotrexate (RHEUMATREX) 2.5 MG tablet     . Multiple Vitamins-Minerals (MULTIVITAMIN WITH MINERALS) tablet Take 1 tablet by mouth daily.    . pioglitazone (ACTOS) 30 MG tablet Take 1 tablet (30 mg total) by mouth daily. 30 tablet 3  . rosuvastatin (CRESTOR) 40 MG tablet Take 1 tablet (40 mg total) by mouth daily. 30 tablet 5  . traMADol (ULTRAM)  50 MG tablet Take 1 tablet (50 mg total) by mouth every 8 (eight) hours as needed. 60 tablet 0   No current facility-administered medications on file prior to visit.     Past Surgical History:  Procedure Laterality Date  . ABDOMINAL HYSTERECTOMY  1989  . CARDIAC CATHETERIZATION N/A 11/27/2014   Procedure: Left Heart Cath and Coronary Angiography;  Surgeon: Burnell Blanks, MD;  Location: [facility];  Service: Cardiovascular;  Laterality: N/A;  . LAPAROSCOPIC GASTRIC BANDING  2009  . TONSILLECTOMY  1979    Allergies  Allergen Reactions  . Aspirin     GI upset    Social History   Social History  . Marital status: Married    Spouse name: N/A  . Number of children: 5  . Years of education: N/A   Occupational History  . Not on file.   Social History Main Topics  . Smoking status: Never Smoker  . Smokeless tobacco: Never Used  . Alcohol use 0.0 oz/week     Comment: Rare  . Drug use: No  . Sexual activity: Not on file   Other Topics Concern  . Not on file   Social History Narrative  . No narrative on file    Family History  Problem Relation Age of Onset  . Arthritis      paternal grandparents  . Hyperlipidemia Mother   . Stroke Mother  maternal grandmother  . Hypertension Mother   . Diabetes Mother   . Hyperlipidemia Father     maternal/paternal grandparents  . Heart disease Father     MI at age 29  . Hypertension Father   . Hypertension Paternal Grandmother   . CAD Brother     MI at age 54  . CAD Sister     BP (!) 141/81   Pulse 77   Ht 5\' 3"  (1.6 m)   Wt 270 lb (122.5 kg)   BMI 47.83 kg/m   Review of Systems: See HPI above.     Objective:  Physical Exam:  Gen: NAD, comfortable in exam room  Exam not repeated today. Bilateral knees: No gross deformity, ecchymoses, swelling. TTP medial joint lines. FROM. Negative ant/post drawers. Negative valgus/varus testing. Negative lachmanns. Negative mcmurrays, apleys, patellar  apprehension. NV intact distally.   Assessment & Plan:  1. Bilateral knee pain - 2/2 DJD.   We discussed tylenol, glucosamine, topical medications.  S/p cortisone injections without much benefit.  Will go ahead with supartz series starting today.  Heat/ice.  F/u in 1 week for second injection.  After informed written consent, patient was seated on exam table. Right knee was prepped with alcohol swab and utilizing anteromedial approach, patient's right knee was injected intraarticularly with 77mL bupivicaine followed by supartz. Patient tolerated the procedure well without immediate complications.  After informed written consent, patient was seated on exam table. Left knee was prepped with alcohol swab and utilizing anteromedial approach, patient's left knee was injected intraarticularly with 37mL bupivicaine followed by supartz. Patient tolerated the procedure well without immediate complications.

## 2016-02-03 ENCOUNTER — Encounter: Payer: Self-pay | Admitting: Family Medicine

## 2016-02-03 ENCOUNTER — Ambulatory Visit (INDEPENDENT_AMBULATORY_CARE_PROVIDER_SITE_OTHER): Payer: Medicare Other | Admitting: Family Medicine

## 2016-02-03 VITALS — BP 160/83 | HR 69 | Ht 63.0 in | Wt 270.0 lb

## 2016-02-03 DIAGNOSIS — M170 Bilateral primary osteoarthritis of knee: Secondary | ICD-10-CM

## 2016-02-03 MED ORDER — SODIUM HYALURONATE (VISCOSUP) 25 MG/2.5ML IX SOSY
2.5000 mL | PREFILLED_SYRINGE | Freq: Once | INTRA_ARTICULAR | Status: AC
  Administered 2016-02-03: 2.5 mL via INTRA_ARTICULAR

## 2016-02-03 NOTE — Progress Notes (Signed)
PCP: Nance Pear., NP  Subjective:   HPI: Patient is a 66 y.o. female here for bilateral knee pain.  12/03/15: Patient reports she has known lupus and RA diagnosed this March. She takes methotrexate, tylenol, diclofenac, cytotec (has tried all of these - reports GI bleeding with diclofenac). Has had problems past year with knees giving out, anteromedial pain. [place] [address] twice in November due to this. Pain is 10/10, sharp. Had improvement following knee injection 3 years ago. No skin changes, numbness.  01/28/16: Patient returns to start supartz series.  Pain right knee 10/10, left 8/10. No skin changes, numbness.  02/03/16: Patient reports her pain is 0/10 currently. Feels better compared to last visit. No skin changes, numbness.  Past Medical History:  Diagnosis Date  . Aortic stenosis   . Arthritis   . Diabetes mellitus, type 2 ([place])   . Hyperlipidemia   . Hypertension     Current Outpatient Prescriptions on File Prior to Visit  Medication Sig Dispense Refill  . acetaminophen (TYLENOL) 500 MG tablet Take 1,500 mg by mouth daily as needed for mild pain.     [name] amLODipine (NORVASC) 5 MG tablet TAKE 1 TABLET (5 MG TOTAL) BY MOUTH DAILY. 90 tablet 0  . Cholecalciferol (VITAMIN D3) 3000 UNITS TABS Take 1 tablet by mouth daily.    . diclofenac (VOLTAREN) 75 MG EC tablet     . labetalol (NORMODYNE) 200 MG tablet TAKE 2 TABLETS (400 MG TOTAL) BY MOUTH 2 (TWO) TIMES DAILY. *MAKE APPT FOR MORE REFILLS* 360 tablet 0  . lisinopril-hydrochlorothiazide (PRINZIDE,ZESTORETIC) 20-25 MG tablet TAKE 2 TABLETS BY MOUTH DAILY. 180 tablet 0  . metformin (FORTAMET) 1000 MG (OSM) 24 hr tablet Take 2 tablets (2,000 mg total) by mouth daily with breakfast. 60 tablet 2  . methotrexate (RHEUMATREX) 2.5 MG tablet     . Multiple Vitamins-Minerals (MULTIVITAMIN WITH MINERALS) tablet Take 1 tablet by mouth daily.    . pioglitazone (ACTOS) 30 MG tablet Take 1 tablet (30 mg total) by mouth daily. 30  tablet 3  . rosuvastatin (CRESTOR) 40 MG tablet Take 1 tablet (40 mg total) by mouth daily. 30 tablet 5  . traMADol (ULTRAM) 50 MG tablet Take 1 tablet (50 mg total) by mouth every 8 (eight) hours as needed. 60 tablet 0   No current facility-administered medications on file prior to visit.     Past Surgical History:  Procedure Laterality Date  . ABDOMINAL HYSTERECTOMY  1989  . CARDIAC CATHETERIZATION N/A 11/27/2014   Procedure: Left Heart Cath and Coronary Angiography;  Surgeon: Burnell Blanks, MD;  Location: [facility];  Service: Cardiovascular;  Laterality: N/A;  . LAPAROSCOPIC GASTRIC BANDING  2009  . TONSILLECTOMY  1979    Allergies  Allergen Reactions  . Aspirin     GI upset    Social History   Social History  . Marital status: Married    Spouse name: N/A  . Number of children: 5  . Years of education: N/A   Occupational History  . Not on file.   Social History Main Topics  . Smoking status: Never Smoker  . Smokeless tobacco: Never Used  . Alcohol use 0.0 oz/week     Comment: Rare  . Drug use: No  . Sexual activity: Not on file   Other Topics Concern  . Not on file   Social History Narrative  . No narrative on file    Family History  Problem Relation Age of Onset  . Arthritis  paternal grandparents  . Hyperlipidemia Mother   . Stroke Mother     maternal grandmother  . Hypertension Mother   . Diabetes Mother   . Hyperlipidemia Father     maternal/paternal grandparents  . Heart disease Father     MI at age 90  . Hypertension Father   . Hypertension Paternal Grandmother   . CAD Brother     MI at age 71  . CAD Sister     BP (!) 160/83   Pulse 69   Ht 5\' 3"  (1.6 m)   Wt 270 lb (122.5 kg)   BMI 47.83 kg/m   Review of Systems: See HPI above.     Objective:  Physical Exam:  Gen: NAD, comfortable in exam room  Exam not repeated today. Bilateral knees: No gross deformity, ecchymoses, swelling. TTP medial joint  lines. FROM. Negative ant/post drawers. Negative valgus/varus testing. Negative lachmanns. Negative mcmurrays, apleys, patellar apprehension. NV intact distally.   Assessment & Plan:  1. Bilateral knee pain - 2/2 DJD.   We discussed tylenol, glucosamine, topical medications.  S/p cortisone injections without much benefit.  Second supartz injection given today.  Heat/ice.  F/u in 1 week for third injection.  After informed written consent, patient was seated on exam table. Right knee was prepped with alcohol swab and utilizing anteromedial approach, patient's right knee was injected intraarticularly with 57mL bupivicaine followed by supartz. Patient tolerated the procedure well without immediate complications.  After informed written consent, patient was seated on exam table. Left knee was prepped with alcohol swab and utilizing anteromedial approach, patient's left knee was injected intraarticularly with 72mL bupivicaine followed by supartz. Patient tolerated the procedure well without immediate complications.

## 2016-02-03 NOTE — Assessment & Plan Note (Signed)
We discussed tylenol, glucosamine, topical medications.  S/p cortisone injections without much benefit.  Second supartz injection given today.  Heat/ice.  F/u in 1 week for third injection.  After informed written consent, patient was seated on exam table. Right knee was prepped with alcohol swab and utilizing anteromedial approach, patient's right knee was injected intraarticularly with 88mL bupivicaine followed by supartz. Patient tolerated the procedure well without immediate complications.  After informed written consent, patient was seated on exam table. Left knee was prepped with alcohol swab and utilizing anteromedial approach, patient's left knee was injected intraarticularly with 13mL bupivicaine followed by supartz. Patient tolerated the procedure well without immediate complications.

## 2016-02-11 ENCOUNTER — Encounter: Payer: Self-pay | Admitting: Family Medicine

## 2016-02-11 ENCOUNTER — Ambulatory Visit (INDEPENDENT_AMBULATORY_CARE_PROVIDER_SITE_OTHER): Payer: Medicare Other | Admitting: Family Medicine

## 2016-02-11 VITALS — BP 166/101 | HR 87 | Ht 63.0 in | Wt 270.0 lb

## 2016-02-11 DIAGNOSIS — M170 Bilateral primary osteoarthritis of knee: Secondary | ICD-10-CM | POA: Diagnosis not present

## 2016-02-11 MED ORDER — SODIUM HYALURONATE (VISCOSUP) 25 MG/2.5ML IX SOSY
2.5000 mL | PREFILLED_SYRINGE | Freq: Once | INTRA_ARTICULAR | Status: AC
  Administered 2016-02-11: 2.5 mL via INTRA_ARTICULAR

## 2016-02-12 NOTE — Assessment & Plan Note (Signed)
We discussed tylenol, glucosamine, topical medications.  S/p cortisone injections without much benefit.  Third supartz injection given today.  Heat/ice.  Call us in 4 weeks to let us know how she's doing.  After informed written consent, patient was seated on exam table. Right knee was prepped with alcohol swab and utilizing anteromedial approach, patient's right knee was injected intraarticularly with 25mL bupivicaine followed by supartz. Patient tolerated the procedure well without immediate complications.  After informed written consent, patient was seated on exam table. Left knee was prepped with alcohol swab and utilizing anteromedial approach, patient's left knee was injected intraarticularly with 36mL bupivicaine followed by supartz. Patient tolerated the procedure well without immediate complications.

## 2016-02-12 NOTE — Progress Notes (Signed)
PCP: Nance Pear., NP  Subjective:   HPI: Patient is a 66 y.o. female here for bilateral knee pain.  12/03/15: Patient reports she has known lupus and RA diagnosed this March. She takes methotrexate, tylenol, diclofenac, cytotec (has tried all of these - reports GI bleeding with diclofenac). Has had problems past year with knees giving out, anteromedial pain. [place] twice in November due to this. Pain is 10/10, sharp. Had improvement following knee injection 3 years ago. No skin changes, numbness.  01/28/16: Patient returns to start supartz series.  Pain right knee 10/10, left 8/10. No skin changes, numbness.  02/03/16: Patient reports her pain is 0/10 currently. Feels better compared to last visit. No skin changes, numbness.  2/9: Patient reports pain is 6/10, a soreness. No skin changes numbness.  Past Medical History:  Diagnosis Date  . Aortic stenosis   . Arthritis   . Diabetes mellitus, type 2 ([place])   . Hyperlipidemia   . Hypertension     Current Outpatient Prescriptions on File Prior to Visit  Medication Sig Dispense Refill  . acetaminophen (TYLENOL) 500 MG tablet Take 1,500 mg by mouth daily as needed for mild pain.     [name] amLODipine (NORVASC) 5 MG tablet TAKE 1 TABLET (5 MG TOTAL) BY MOUTH DAILY. 90 tablet 0  . Cholecalciferol (VITAMIN D3) 3000 UNITS TABS Take 1 tablet by mouth daily.    . diclofenac (VOLTAREN) 75 MG EC tablet     . labetalol (NORMODYNE) 200 MG tablet TAKE 2 TABLETS (400 MG TOTAL) BY MOUTH 2 (TWO) TIMES DAILY. *MAKE APPT FOR MORE REFILLS* 360 tablet 0  . lisinopril-hydrochlorothiazide (PRINZIDE,ZESTORETIC) 20-25 MG tablet TAKE 2 TABLETS BY MOUTH DAILY. 180 tablet 0  . metformin (FORTAMET) 1000 MG (OSM) 24 hr tablet Take 2 tablets (2,000 mg total) by mouth daily with breakfast. 60 tablet 2  . methotrexate (RHEUMATREX) 2.5 MG tablet     . Multiple Vitamins-Minerals (MULTIVITAMIN WITH MINERALS) tablet Take 1 tablet by mouth daily.    .  pioglitazone (ACTOS) 30 MG tablet Take 1 tablet (30 mg total) by mouth daily. 30 tablet 3  . rosuvastatin (CRESTOR) 40 MG tablet Take 1 tablet (40 mg total) by mouth daily. 30 tablet 5  . traMADol (ULTRAM) 50 MG tablet Take 1 tablet (50 mg total) by mouth every 8 (eight) hours as needed. 60 tablet 0   No current facility-administered medications on file prior to visit.     Past Surgical History:  Procedure Laterality Date  . ABDOMINAL HYSTERECTOMY  1989  . CARDIAC CATHETERIZATION N/A 11/27/2014   Procedure: Left Heart Cath and Coronary Angiography;  Surgeon: Burnell Blanks, MD;  Location: [facility];  Service: Cardiovascular;  Laterality: N/A;  . LAPAROSCOPIC GASTRIC BANDING  2009  . TONSILLECTOMY  1979    Allergies  Allergen Reactions  . Aspirin     GI upset    Social History   Social History  . Marital status: Married    Spouse name: N/A  . Number of children: 5  . Years of education: N/A   Occupational History  . Not on file.   Social History Main Topics  . Smoking status: Never Smoker  . Smokeless tobacco: Never Used  . Alcohol use 0.0 oz/week     Comment: Rare  . Drug use: No  . Sexual activity: Not on file   Other Topics Concern  . Not on file   Social History Narrative  . No narrative on file  Family History  Problem Relation Age of Onset  . Arthritis      paternal grandparents  . Hyperlipidemia Mother   . Stroke Mother     maternal grandmother  . Hypertension Mother   . Diabetes Mother   . Hyperlipidemia Father     maternal/paternal grandparents  . Heart disease Father     MI at age 60  . Hypertension Father   . Hypertension Paternal Grandmother   . CAD Brother     MI at age 56  . CAD Sister     BP (!) 166/101   Pulse 87   Ht 5\' 3"  (1.6 m)   Wt 270 lb (122.5 kg)   BMI 47.83 kg/m   Review of Systems: See HPI above.     Objective:  Physical Exam:  Gen: NAD, comfortable in exam room  Exam not repeated  today. Bilateral knees: No gross deformity, ecchymoses, swelling. TTP medial joint lines. FROM. Negative ant/post drawers. Negative valgus/varus testing. Negative lachmanns. Negative mcmurrays, apleys, patellar apprehension. NV intact distally.   Assessment & Plan:  1. Bilateral knee pain - 2/2 DJD.   We discussed tylenol, glucosamine, topical medications.  S/p cortisone injections without much benefit.  Third supartz injection given today.  Heat/ice.  Call us in 4 weeks to let us know how she's doing.  After informed written consent, patient was seated on exam table. Right knee was prepped with alcohol swab and utilizing anteromedial approach, patient's right knee was injected intraarticularly with 9mL bupivicaine followed by supartz. Patient tolerated the procedure well without immediate complications.  After informed written consent, patient was seated on exam table. Left knee was prepped with alcohol swab and utilizing anteromedial approach, patient's left knee was injected intraarticularly with 80mL bupivicaine followed by supartz. Patient tolerated the procedure well without immediate complications.

## 2016-02-24 ENCOUNTER — Telehealth: Payer: Self-pay | Admitting: *Deleted

## 2016-02-24 NOTE — Telephone Encounter (Signed)
LM for patient to return call to schedule AWV.   

## 2016-04-07 ENCOUNTER — Telehealth: Payer: Self-pay | Admitting: Family

## 2016-04-07 MED FILL — METHOTREXATE 25 MG/ML VIAL: 50 | 14 days supply | Qty: 4 | Fill #0

## 2016-04-07 MED FILL — FOLIC ACID 1 MG TABLET: 1 | 30 days supply | Qty: 60 | Fill #0

## 2016-04-07 NOTE — Telephone Encounter (Signed)
2nd call to patient regarding awv. Lvm for patient to call office to schedule appt.

## 2016-04-10 ENCOUNTER — Telehealth: Payer: Self-pay | Admitting: Family

## 2016-04-10 NOTE — Telephone Encounter (Signed)
Left pt message asking to call Allison back directly at [phone] to schedule AWV. Thanks! °

## 2016-04-11 ENCOUNTER — Other Ambulatory Visit: Payer: Self-pay | Admitting: Family

## 2016-04-11 MED FILL — DICLOFENAC SOD 75 MG TAB EC: 75 | 30 days supply | Qty: 60 | Fill #1

## 2016-04-12 MED FILL — ROSUVASTATIN CALCIUM 40 MG: 40 | 30 days supply | Qty: 30 | Fill #0

## 2016-04-27 ENCOUNTER — Other Ambulatory Visit: Payer: Self-pay | Admitting: Family

## 2016-04-28 ENCOUNTER — Encounter: Payer: Self-pay | Admitting: Family

## 2016-04-28 ENCOUNTER — Ambulatory Visit (INDEPENDENT_AMBULATORY_CARE_PROVIDER_SITE_OTHER): Payer: Medicare Other | Admitting: Family

## 2016-04-28 ENCOUNTER — Telehealth: Payer: Self-pay | Admitting: *Deleted

## 2016-04-28 ENCOUNTER — Ambulatory Visit: Payer: Self-pay | Admitting: Family

## 2016-04-28 VITALS — BP 148/73 | HR 79 | Temp 98.1°F | Resp 18 | Ht 63.0 in | Wt 281.0 lb

## 2016-04-28 DIAGNOSIS — M069 Rheumatoid arthritis, unspecified: Secondary | ICD-10-CM

## 2016-04-28 DIAGNOSIS — R350 Frequency of micturition: Secondary | ICD-10-CM | POA: Diagnosis not present

## 2016-04-28 DIAGNOSIS — E1129 Type 2 diabetes mellitus with other diabetic kidney complication: Secondary | ICD-10-CM | POA: Diagnosis not present

## 2016-04-28 DIAGNOSIS — E119 Type 2 diabetes mellitus without complications: Secondary | ICD-10-CM

## 2016-04-28 DIAGNOSIS — G894 Chronic pain syndrome: Secondary | ICD-10-CM

## 2016-04-28 DIAGNOSIS — M329 Systemic lupus erythematosus, unspecified: Secondary | ICD-10-CM | POA: Diagnosis not present

## 2016-04-28 DIAGNOSIS — I10 Essential (primary) hypertension: Secondary | ICD-10-CM

## 2016-04-28 DIAGNOSIS — E1165 Type 2 diabetes mellitus with hyperglycemia: Secondary | ICD-10-CM

## 2016-04-28 DIAGNOSIS — E785 Hyperlipidemia, unspecified: Secondary | ICD-10-CM

## 2016-04-28 DIAGNOSIS — IMO0002 Reserved for concepts with insufficient information to code with codable children: Secondary | ICD-10-CM

## 2016-04-28 HISTORY — DX: Systemic lupus erythematosus, unspecified: M32.9

## 2016-04-28 LAB — LIPID PANEL
CHOLESTEROL: 368 mg/dL — AB (ref 0–200)
HDL: 46.5 mg/dL (ref >39.00)
LDL CALC: 302 mg/dL — AB (ref 0–99)
NonHDL: 321.67
Total CHOL/HDL Ratio: 8
Triglycerides: 100 mg/dL (ref 0.0–149.0)
VLDL: 20 mg/dL (ref 0.0–40.0)

## 2016-04-28 LAB — MICROALBUMIN / CREATININE URINE RATIO
Creatinine,U: 220.3 mg/dL
Microalb Creat Ratio: 2.1 mg/g (ref 0.0–30.0)
Microalb, Ur: 4.7 mg/dL — ABNORMAL HIGH (ref 0.0–1.9)

## 2016-04-28 LAB — BASIC METABOLIC PANEL
BUN: 15 mg/dL (ref 6–23)
CHLORIDE: 104 meq/L (ref 96–112)
CO2: 28 mEq/L (ref 19–32)
CREATININE: 0.94 mg/dL (ref 0.40–1.20)
Calcium: 9.1 mg/dL (ref 8.4–10.5)
GFR: 76.12 mL/min (ref >60.00)
GLUCOSE: 217 mg/dL — AB (ref 70–99)
POTASSIUM: 4 meq/L (ref 3.5–5.1)
Sodium: 140 mEq/L (ref 135–145)

## 2016-04-28 LAB — HEMOGLOBIN A1C: HEMOGLOBIN A1C: 8.2 % — AB (ref 4.6–6.5)

## 2016-04-28 MED ORDER — METFORMIN HCL ER (OSM) 1000 MG PO TB24: 2000.0000 mg | ORAL_TABLET | Freq: Every day | ORAL | 2 refills | Status: DC

## 2016-04-28 MED ORDER — ROSUVASTATIN CALCIUM 40 MG PO TABS: 40.0000 mg | ORAL_TABLET | Freq: Every day | ORAL | 5 refills | Status: DC

## 2016-04-28 MED ORDER — PIOGLITAZONE HCL 30 MG PO TABS: 30.0000 mg | ORAL_TABLET | Freq: Every day | ORAL | 3 refills | Status: DC

## 2016-04-28 MED ORDER — METFORMIN HCL ER 500 MG PO TB24: 2000.0000 mg | ORAL_TABLET | Freq: Every day | ORAL | 5 refills | Status: DC

## 2016-04-28 MED ORDER — OLMESARTAN MEDOXOMIL-HCTZ 20-12.5 MG PO TABS: 1.0000 | ORAL_TABLET | Freq: Every day | ORAL | 2 refills | Status: DC

## 2016-04-28 MED FILL — OLMESARTAN-HCTZ 20-12.5 MG: 20-12.5 | 30 days supply | Qty: 30 | Fill #0

## 2016-04-28 MED FILL — PIOGLITAZONE HCL 30 MG TAB: 30 | 30 days supply | Qty: 30 | Fill #0

## 2016-04-28 MED FILL — LABETALOL HCL 200 MG TABLET: 200 | 90 days supply | Qty: 360 | Fill #0

## 2016-04-28 NOTE — Telephone Encounter (Signed)
Received fax from [facility] that Metformin ER 1000mg  will require PA. Insurance prefers generic glucophage XR.  Please advise?

## 2016-04-28 NOTE — Progress Notes (Signed)
Subjective:    Patient ID: Virginia Flores, female    DOB: 1950-02-03, 66 y.o.   MRN: [id number]  HPI  Virginia Flores is a 66 yr old female who presents today for follow up.   HTN- ran out of some of her antihypertensive meds.    BP Readings from Last 3 Encounters:  04/28/16 (!) 148/73  02/11/16 (!) 166/101  02/03/16 (!) 160/83   Hyperlipidemia- She stopped crestor then restarted last month.   Lab Results  Component Value Date   CHOL 245 (H) 03/26/2015   HDL 44.90 03/26/2015   LDLCALC 189 (H) 03/26/2015   LDLDIRECT 161.2 02/02/2009   TRIG 56.0 03/26/2015   CHOLHDL 5 03/26/2015   DM2- currently maintained on metformin.  Using metformin from a friend and was only taking 500mg /day. Not taking actos.  Lab Results  Component Value Date   HGBA1C 8.1 (H) 03/26/2015   HGBA1C 7.8 (H) 09/04/2014   HGBA1C 8.4 (H) 05/06/2014   Lab Results  Component Value Date   MICROALBUR 3.7 (H) 05/06/2014   LDLCALC 189 (H) 03/26/2015   CREATININE 0.95 03/26/2015   SLE/RA- diagnosed 2/18.  She is working with Berna Bue.  Moved in with her daughter and granddaughter during flare. She is also working with Dr. Barbaraann Barthel who has given her some Suparz injections.  Requesting referral to pain management.   Notes some urinary incontinence which is improving.  Denies dysuria. Does have some mild frequent  RLS- taking hemp oil at night and this seems to help her symptoms.      Review of Systems See HPI  Past Medical History:  Diagnosis Date  . Aortic stenosis   . Arthritis   . Diabetes mellitus, type 2 ([place])   . Hyperlipidemia   . Hypertension      Social History   Social History  . Marital status: Married    Spouse name: N/A  . Number of children: 5  . Years of education: N/A   Occupational History  . Not on file.   Social History Main Topics  . Smoking status: Never Smoker  . Smokeless tobacco: Never Used  . Alcohol use 0.0 oz/week     Comment: Rare  . Drug use: No  . Sexual  activity: Not on file   Other Topics Concern  . Not on file   Social History Narrative  . No narrative on file    Past Surgical History:  Procedure Laterality Date  . ABDOMINAL HYSTERECTOMY  1989  . CARDIAC CATHETERIZATION N/A 11/27/2014   Procedure: Left Heart Cath and Coronary Angiography;  Surgeon: Burnell Blanks, MD;  Location: [facility];  Service: Cardiovascular;  Laterality: N/A;  . LAPAROSCOPIC GASTRIC BANDING  2009  . TONSILLECTOMY  1979    Family History  Problem Relation Age of Onset  . Arthritis      paternal grandparents  . Hyperlipidemia Mother   . Stroke Mother     maternal grandmother  . Hypertension Mother   . Diabetes Mother   . Hyperlipidemia Father     maternal/paternal grandparents  . Heart disease Father     MI at age 33  . Hypertension Father   . Hypertension Paternal Grandmother   . CAD Brother     MI at age 42  . CAD Sister     Allergies  Allergen Reactions  . Aspirin     GI upset    Current Outpatient Prescriptions on File Prior to Visit  Medication  Sig Dispense Refill  . acetaminophen (TYLENOL) 500 MG tablet Take 1,500 mg by mouth daily as needed for mild pain.     [name] amLODipine (NORVASC) 5 MG tablet TAKE 1 TABLET (5 MG TOTAL) BY MOUTH DAILY. 90 tablet 0  . Cholecalciferol (VITAMIN D3) 3000 UNITS TABS Take 1 tablet by mouth daily.    . diclofenac (VOLTAREN) 75 MG EC tablet Take 75 mg by mouth 2 (two) times daily as needed.     . labetalol (NORMODYNE) 200 MG tablet TAKE 2 TABLETS (400 MG TOTAL) BY MOUTH 2 (TWO) TIMES DAILY. *MAKE APPT FOR MORE REFILLS* 360 tablet 0  . lisinopril-hydrochlorothiazide (PRINZIDE,ZESTORETIC) 20-25 MG tablet TAKE 2 TABLETS BY MOUTH DAILY. 180 tablet 0  . metformin (FORTAMET) 1000 MG (OSM) 24 hr tablet Take 2 tablets (2,000 mg total) by mouth daily with breakfast. 60 tablet 2  . Multiple Vitamins-Minerals (MULTIVITAMIN WITH MINERALS) tablet Take 1 tablet by mouth daily.    . pioglitazone (ACTOS)  30 MG tablet Take 1 tablet (30 mg total) by mouth daily. 30 tablet 3  . rosuvastatin (CRESTOR) 40 MG tablet TAKE 1 TABLET (40 MG TOTAL) BY MOUTH DAILY. 30 tablet 5   No current facility-administered medications on file prior to visit.     BP (!) 148/73 (BP Location: Left Arm, Cuff Size: Large)   Pulse 79   Temp 98.1 F (36.7 C) (Oral)   Resp 18   Ht 5\' 3"  (1.6 m)   Wt 281 lb (127.5 kg)   SpO2 98% Comment: room air  BMI 49.78 kg/m       Objective:   Physical Exam  Constitutional: She appears well-developed and well-nourished.  Cardiovascular: Normal rate, regular rhythm and normal heart sounds.   No murmur heard. Pulmonary/Chest: Effort normal and breath sounds normal. No respiratory distress. She has no wheezes.  Psychiatric: She has a normal mood and affect. Her behavior is normal. Judgment and thought content normal.                Assessment & Plan:         Assessment & Plan:

## 2016-04-28 NOTE — Assessment & Plan Note (Signed)
Uncontrolled. Repeat lipid panel.

## 2016-04-28 NOTE — Assessment & Plan Note (Signed)
Uncontrolled. Resume BP meds.

## 2016-04-28 NOTE — Assessment & Plan Note (Signed)
Followed by [facility].

## 2016-04-28 NOTE — Progress Notes (Signed)
Pre visit review using our clinic review tool, if applicable. No additional management support is needed unless otherwise documented below in the visit note. 

## 2016-04-28 NOTE — Assessment & Plan Note (Signed)
Stable, followed by Gavin Pound. Rheumatology.

## 2016-04-28 NOTE — Telephone Encounter (Signed)
rx sent for generic metformin xr.

## 2016-04-28 NOTE — Assessment & Plan Note (Signed)
Uncontrolled. Resume meds at previous dosing.

## 2016-04-28 NOTE — Patient Instructions (Signed)
Stop lisinopril hct Begin olmesartan hct. Complete lab work prior to leaving.

## 2016-04-30 LAB — URINE CULTURE

## 2016-05-12 ENCOUNTER — Encounter: Payer: Self-pay | Admitting: Family

## 2016-05-25 ENCOUNTER — Encounter: Payer: Self-pay | Admitting: Family

## 2016-05-25 MED FILL — OLMESARTAN-HCTZ 20-12.5 MG: 20-12.5 | 30 days supply | Qty: 30 | Fill #1

## 2016-05-25 MED FILL — METFORMIN HCL ER 500 MG TAB: 500 | 30 days supply | Qty: 120 | Fill #0

## 2016-05-25 MED FILL — ROSUVASTATIN CALCIUM 40 MG: 40 | 30 days supply | Qty: 30 | Fill #1

## 2016-05-25 MED FILL — DICLOFENAC SOD 75 MG TAB EC: 75 | 30 days supply | Qty: 60 | Fill #2

## 2016-05-26 MED ORDER — GLUCOSE BLOOD VI STRP: ORAL_STRIP | 1 refills | Status: AC

## 2016-05-26 MED ORDER — ONETOUCH ULTRASOFT LANCETS MISC: 1 refills | Status: AC

## 2016-05-26 MED ORDER — ONETOUCH ULTRA SYSTEM W/DEVICE KIT: 1.0000 | PACK | Freq: Once | 0 refills | Status: AC

## 2016-05-26 MED FILL — ONE TOUCH ULTRA TEST STRIPS: 50 days supply | Qty: 50 | Fill #0

## 2016-05-26 MED FILL — ONE TOUCH ULTRASOFT LANCETS: 90 days supply | Qty: 100 | Fill #0

## 2016-05-26 MED FILL — ONE TOUCH ULTRAMINI METER: W/DEVICE | 1 days supply | Qty: 1 | Fill #0

## 2016-05-31 NOTE — Telephone Encounter (Signed)
Left pt message asking to call Allison back directly at [phone] to schedule AWV. Thanks! °

## 2016-06-30 MED FILL — DICLOFENAC SOD 75 MG TAB EC: 75 | 30 days supply | Qty: 60 | Fill #3

## 2016-06-30 MED FILL — METFORMIN HCL ER 500 MG TAB: 500 | 30 days supply | Qty: 120 | Fill #1

## 2016-06-30 MED FILL — OLMESARTAN-HCTZ 20-12.5 MG: 20-12.5 | 30 days supply | Qty: 30 | Fill #2

## 2016-06-30 MED FILL — METHOTREXATE 25 MG/ML VIAL: 50 | 14 days supply | Qty: 4 | Fill #1

## 2016-06-30 MED FILL — ROSUVASTATIN CALCIUM 40 MG: 40 | 30 days supply | Qty: 30 | Fill #2

## 2016-08-05 IMAGING — NM NM MYOCAR MULTI W/ SPECT
3 series · 18 of 18 positions shown · non-contrast
Comparison: none

[Series 1: stress · 6.51mm/px · 6 of 512 frames shown (1 of 2)]
[frame 43/512]
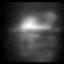
[frame 128/512]
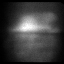
[frame 214/512]
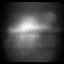
[frame 299/512]
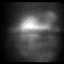
[frame 384/512]
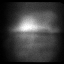
[frame 470/512]
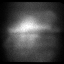

[Series 1: stress · 6.51mm/px · 6 of 64 frames shown (2 of 2)]
[frame 6/64]
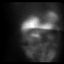
[frame 16/64]
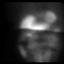
[frame 27/64]
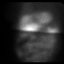
[frame 38/64]
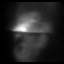
[frame 48/64]
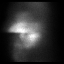
[frame 59/64]
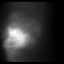

[Series 2: rest · 6.51mm/px · 6 of 64 frames shown]
[frame 6/64]
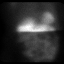
[frame 16/64]
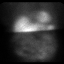
[frame 27/64]
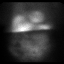
[frame 38/64]
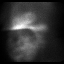
[frame 48/64]
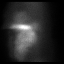
[frame 59/64]
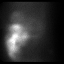

[18 of 18 positions shown; findings below may reference images not displayed]

Canned report from images found in remote index.

Refer to host system for actual result text.

## 2016-08-25 ENCOUNTER — Other Ambulatory Visit: Payer: Self-pay | Admitting: Family Medicine

## 2016-08-25 ENCOUNTER — Other Ambulatory Visit: Payer: Self-pay | Admitting: Family

## 2016-08-25 MED FILL — PIOGLITAZONE HCL 30 MG TAB: 30 | 30 days supply | Qty: 30 | Fill #1

## 2016-08-25 MED FILL — LABETALOL HCL 200 MG TABLET: 200 | 30 days supply | Qty: 120 | Fill #0

## 2016-08-25 MED FILL — ROSUVASTATIN CALCIUM 40 MG: 40 | 30 days supply | Qty: 30 | Fill #3

## 2016-08-25 MED FILL — METFORMIN HCL ER 500 MG TAB: 500 | 30 days supply | Qty: 120 | Fill #2

## 2016-08-25 MED FILL — OLMESARTAN-HCTZ 20-12.5 MG: 20-12.5 | 30 days supply | Qty: 30 | Fill #0

## 2016-08-25 MED FILL — METHOTREXATE 25 MG/ML VIAL: 50 | 14 days supply | Qty: 4 | Fill #2

## 2016-09-19 ENCOUNTER — Encounter (HOSPITAL_COMMUNITY): Payer: Self-pay

## 2016-10-13 ENCOUNTER — Other Ambulatory Visit: Payer: Self-pay | Admitting: Family

## 2016-10-13 MED FILL — OLMESARTAN-HCTZ 20-12.5 MG: 20-12.5 | 30 days supply | Qty: 30 | Fill #1

## 2016-10-13 MED FILL — ROSUVASTATIN CALCIUM 40 MG: 40 | 30 days supply | Qty: 30 | Fill #4

## 2016-10-13 MED FILL — PIOGLITAZONE HCL 30 MG TAB: 30 | 30 days supply | Qty: 30 | Fill #2

## 2016-10-13 MED FILL — METFORMIN HCL ER 500 MG TAB: 500 | 30 days supply | Qty: 120 | Fill #3

## 2017-08-06 ENCOUNTER — Encounter: Payer: Self-pay | Admitting: Family

## 2017-09-05 ENCOUNTER — Encounter (HOSPITAL_COMMUNITY): Payer: Self-pay

## 2017-09-10 ENCOUNTER — Ambulatory Visit: Payer: Medicare Other | Admitting: Family Medicine

## 2017-09-10 ENCOUNTER — Encounter: Payer: Self-pay | Admitting: Family Medicine

## 2017-09-10 DIAGNOSIS — M5432 Sciatica, left side: Secondary | ICD-10-CM

## 2017-09-10 DIAGNOSIS — G8929 Other chronic pain: Secondary | ICD-10-CM

## 2017-09-10 DIAGNOSIS — M25561 Pain in right knee: Secondary | ICD-10-CM

## 2017-09-10 DIAGNOSIS — M25562 Pain in left knee: Secondary | ICD-10-CM

## 2017-09-10 HISTORY — DX: Sciatica, left side: M54.32

## 2017-09-10 MED ORDER — METHYLPREDNISOLONE ACETATE 40 MG/ML IJ SUSP
40.0000 mg | Freq: Once | INTRAMUSCULAR | Status: AC
  Administered 2017-09-10: 40 mg via INTRA_ARTICULAR

## 2017-09-10 NOTE — Assessment & Plan Note (Signed)
Bilateral Knee osteoarthritis Patient with known bilateral knee osteo. Currently takes tylenol 1000mg  tid and dicolfenac 75mg  bid which do help some. Patient does endorse a lot of relief from steroid taper burst. This seems very consistent with her known osteoarthritis. Given no lupus flares recently, not likely contributing. Bilateral knee injections. Well tolerated. - continue stretches - tylenol and diclofenac for pain - bilateral knee corticosteroid injection  After informed written consent, patient was seated on exam table. Right knee was prepped with alcohol swab and utilizing anteromedial approach, patient's right knee was injected intraarticularly with 3:1 marcaine: depomedrol. Patient tolerated the procedure well without immediate complications.  After informed written consent, patient was seated on exam table. Left knee was prepped with alcohol swab and utilizing anteromedial approach, patient's left knee was injected intraarticularly with 3:1 marcaine: depomedrol. Patient tolerated the procedure well without immediate complications.

## 2017-09-10 NOTE — Progress Notes (Signed)
Subjective:    Patient ID: Virginia Flores, female    DOB: 05-May-1950, 67 y.o.   MRN: [id number]  HPI HPI: Patient is a 67 y.o. female here for bilateral knee pain.  12/03/15: Patient reports she has known lupus and RA diagnosed this March. She takes methotrexate, tylenol, diclofenac, cytotec (has tried all of these - reports GI bleeding with diclofenac). Has had problems past year with knees giving out, anteromedial pain. [place] twice in November due to this. Pain is 10/10, sharp. Had improvement following knee injection 3 years ago. No skin changes, numbness.  01/28/16: Patient returns to start supartz series.  Pain right knee 10/10, left 8/10. No skin changes, numbness.  02/03/16: Patient reports her pain is 0/10 currently. Feels better compared to last visit. No skin changes, numbness.  2/9: Patient reports pain is 6/10, a soreness. No skin changes numbness.  9/9: Patient states that she is having bilateral knee pain L>R. Pain is most affecting her when she gets up from rest. Pain is occasionally a 7/10 on L. States this is consistent with past presentations of osteoarthritis. She has been taking tylenol 1000mg  tid, and diclofenac 75mg  bid. Her pain is mainly located at the lateral and medial tibial condyles. She also endorses a burning/shooting pain down her left leg on hip flexion. Says that lupus is well controlled, and that she is no currently taking in medication. Had "bronchopneumonia" in 07/2017, given steroid taper. Says that she felt much better in knees at that time.  No skin changes, numbness.  Past Medical History:  Diagnosis Date  . Aortic stenosis   . Arthritis   . Diabetes mellitus, type 2 ([place])   . Hyperlipidemia   . Hypertension     Current Outpatient Medications on File Prior to Visit  Medication Sig Dispense Refill  . acetaminophen (TYLENOL) 500 MG tablet Take 1,500 mg by mouth daily as needed for mild pain.     . Cholecalciferol (VITAMIN D3) 3000  UNITS TABS Take 1 tablet by mouth daily.    . diclofenac (VOLTAREN) 75 MG EC tablet Take 75 mg by mouth 2 (two) times daily.  0  . folic acid (FOLVITE) 1 MG tablet Take 2 mg by mouth daily.  3  . glucose blood (ONE TOUCH ULTRA TEST) test strip Use as instructed to check blood sugar once a day.  DX E11.65 100 each 1  . labetalol (NORMODYNE) 200 MG tablet TAKE 2 TABLETS (400 MG TOTAL) BY MOUTH 2 (TWO) TIMES DAILY. 120 tablet 0  . Lancets (ONETOUCH ULTRASOFT) lancets Use as instructed to check blood sugar once a day.  DX E11.6 100 each 1  . metFORMIN (GLUCOPHAGE) 500 MG tablet Take 1,000 mg by mouth 2 (two) times daily.  0  . methotrexate 50 MG/2ML injection Inject 80 mg into the skin once a week.  3  . Multiple Vitamins-Minerals (MULTIVITAMIN WITH MINERALS) tablet Take 1 tablet by mouth daily.    [name] olmesartan-hydrochlorothiazide (BENICAR HCT) 20-12.5 MG tablet TAKE 1 TABLET BY MOUTH DAILY. 30 tablet 2  . OVER THE COUNTER MEDICATION CBD (Hemp OIL). Take 15mg  at bedtime.    . pioglitazone (ACTOS) 30 MG tablet Take 1 tablet (30 mg total) by mouth daily. 30 tablet 3  . PROAIR HFA 108 (90 Base) MCG/ACT inhaler INHALE 1 TO 2 PUFFS BY MOUTH EVERY 4 HOURS AS NEEDED FOR WHEEZING WITH SPACER  0  . rosuvastatin (CRESTOR) 40 MG tablet Take 1 tablet (40 mg total) by mouth daily.  30 tablet 5   No current facility-administered medications on file prior to visit.     Past Surgical History:  Procedure Laterality Date  . ABDOMINAL HYSTERECTOMY  1989  . CARDIAC CATHETERIZATION N/A 11/27/2014   Procedure: Left Heart Cath and Coronary Angiography;  Surgeon: Burnell Blanks, MD;  Location: [facility];  Service: Cardiovascular;  Laterality: N/A;  . LAPAROSCOPIC GASTRIC BANDING  2009  . TONSILLECTOMY  1979    Allergies  Allergen Reactions  . Aspirin     GI upset    Social History   Socioeconomic History  . Marital status: Married    Spouse name: Not on file  . Number of children: 5  .  Years of education: Not on file  . Highest education level: Not on file  Occupational History  . Not on file  Social Needs  . Financial resource strain: Not on file  . Food insecurity:    Worry: Not on file    Inability: Not on file  . Transportation needs:    Medical: Not on file    Non-medical: Not on file  Tobacco Use  . Smoking status: Never Smoker  . Smokeless tobacco: Never Used  Substance and Sexual Activity  . Alcohol use: Yes    Alcohol/week: 0.0 standard drinks    Comment: Rare  . Drug use: No  . Sexual activity: Not on file  Lifestyle  . Physical activity:    Days per week: Not on file    Minutes per session: Not on file  . Stress: Not on file  Relationships  . Social connections:    Talks on phone: Not on file    Gets together: Not on file    Attends religious service: Not on file    Active member of club or organization: Not on file    Attends meetings of clubs or organizations: Not on file    Relationship status: Not on file  . Intimate partner violence:    Fear of current or ex partner: Not on file    Emotionally abused: Not on file    Physically abused: Not on file    Forced sexual activity: Not on file  Other Topics Concern  . Not on file  Social History Narrative  . Not on file    Family History  Problem Relation Age of Onset  . Arthritis Unknown        paternal grandparents  . Hyperlipidemia Mother   . Stroke Mother        maternal grandmother  . Hypertension Mother   . Diabetes Mother   . Hyperlipidemia Father        maternal/paternal grandparents  . Heart disease Father        MI at age 11  . Hypertension Father   . Hypertension Paternal Grandmother   . CAD Brother        MI at age 5  . CAD Sister     BP (!) 173/92   Pulse 79   Ht 5\' 5"  (1.651 m)   Wt 275 lb (124.7 kg)   BMI 45.76 kg/m   Review of Systems Per HPI    Objective:   Physical Exam Gen: No acute distress, resting comfortably, AA female Cards: Warm,  well-perfused. Intact pt/dp bilaterally Pulm: no increased shortness of breath, no increased work of breathing. Cough noted MSK: Left leg: No swelling, bruising, other deformity.  Clicking and popping noted in lateral and medial knee. Negative anterior/posterior drawer.  Negative McMurray's sign. Tenderness to palpation lateral and medial joint lines. Positive straight leg test.  FROM with 5/5 strength flexion and extension.  Right Leg: No swelling, bruising, other deformity.  Clicking and popping noted in lateral and medial knee. Negative anterior/posterior drawer. Negative McMurray's sign. Tenderness to palpation lateral and medial joint lines. Negative straight leg.  FROM with 5/5 strength flexion and extension.  NVI bilateral lower extremities    Assessment & Plan:  Bilateral Knee osteoarthritis Patient with known bilateral knee osteoarthritis. Currently takes tylenol 1000mg  tid and dicolfenac 75mg  bid which do help some. Patient does endorse a lot of relief from steroid taper burst. This seems very consistent with her known osteoarthritis. Given no lupus flares recently, not likely contributing. Bilateral knee injections. Well tolerated. - continue stretches - tylenol and diclofenac for pain - bilateral knee corticosteroid injection  After informed written consent timeout was performed, patient was seated on exam table. Right knee was prepped with alcohol swab and utilizing anteromedial approach, patient's right knee was injected intraarticularly with 3:1 bupivicaine: depomedrol. Patient tolerated the procedure well without immediate complications.  After informed written consent timeout was performed, patient was seated on exam table. Left knee was prepped with alcohol swab and utilizing anteromedial approach, patient's left knee was injected intraarticularly with 3:1 bupivicaine: depomedrol. Patient tolerated the procedure well without immediate complications.  Left leg sciatica Patient  declines any increased treatment for left leg sciatica. Feels that is will go away when "her knees get straightened out". If doesn't resolve, can consider gabapentin, NSAIDS, PT, or other conservative treatment options down the road.

## 2017-09-10 NOTE — Progress Notes (Signed)
g

## 2017-09-10 NOTE — Patient Instructions (Signed)
Your pain is due to arthritis. These are the different medications you can take for this: Tylenol 500mg  1-2 tabs three times a day for pain. Capsaicin, aspercreme, or biofreeze topically up to four times a day may also help with pain. Some supplements that may help for arthritis: Boswellia extract, curcumin, pycnogenol Cortisone injections are an option - you were given this today. If cortisone injections do not help, there are different types of shots that may help but they take longer to take effect. - we will look into these for you. It's important that you continue to stay active. Straight leg raises, knee extensions 3 sets of 10 once a day (add ankle weight if these become too easy). Consider physical therapy to strengthen muscles around the joint that hurts to take pressure off of the joint itself. Shoe inserts with good arch support may be helpful. Heat or ice 15 minutes at a time 3-4 times a day as needed to help with pain. Water aerobics and cycling with low resistance are the best two types of exercise for arthritis though any exercise is ok as long as it doesn't worsen the pain. Follow up with me as needed if you're doing well.

## 2017-10-29 MED FILL — metFORMIN HCL 500 MG TABS: 500 | 90 days supply | Qty: 360 | Fill #0

## 2017-10-29 MED FILL — OLMESARTAN-HCTZ 20-12.5 MG: 20-12.5 | 90 days supply | Qty: 90 | Fill #0

## 2017-10-29 MED FILL — LABETALOL HCL 200 MG TABS: 200 | 90 days supply | Qty: 360 | Fill #0

## 2017-10-29 MED FILL — ROSUVASTATIN CALCIUM 40 MG: 40 | 90 days supply | Qty: 90 | Fill #0

## 2017-10-31 ENCOUNTER — Telehealth: Payer: Self-pay | Admitting: *Deleted

## 2017-10-31 NOTE — Telephone Encounter (Signed)
Received request for Medical Records from [facility]; forwarded to Medical Records via email/scan/SLS

## 2017-11-05 MED FILL — DICLOFENAC SOD EC 75 MG TAB: 75 | 90 days supply | Qty: 180 | Fill #0

## 2018-01-11 MED FILL — AMLODIPINE BESYLATE 10 MG T: 10 | 90 days supply | Qty: 90 | Fill #0

## 2018-01-11 MED FILL — JANUVIA 100 MG TABLET: 100 | 30 days supply | Qty: 30 | Fill #0

## 2018-01-11 MED FILL — HYDROCHLOROTHIAZIDE 12.5 MG: 12.5 | 90 days supply | Qty: 90 | Fill #0

## 2018-01-29 MED FILL — METHOCARBAMOL 500 MG TABLET: 500 | 30 days supply | Qty: 60 | Fill #0

## 2018-03-01 MED FILL — ROSUVASTATIN CALCIUM 40 MG: 40 | 90 days supply | Qty: 90 | Fill #1

## 2018-03-01 MED FILL — METHOCARBAMOL 500 MG TABLET: 500 | 30 days supply | Qty: 60 | Fill #1

## 2018-03-01 MED FILL — LABETALOL HCL 200 MG TABS: 200 | 90 days supply | Qty: 360 | Fill #1

## 2018-03-01 MED FILL — JANUVIA 100 MG TABLET: 100 | 30 days supply | Qty: 30 | Fill #1

## 2018-03-08 MED FILL — traMADol HCL 50 MG TABS: 50 | 7 days supply | Qty: 21 | Fill #0

## 2018-03-11 MED FILL — DICLOFENAC SODIUM 75 MG TAB: 75 | 90 days supply | Qty: 180 | Fill #0

## 2018-03-22 MED FILL — traMADol HCL 50 MG TABS: 50 | 28 days supply | Qty: 84 | Fill #1

## 2018-05-08 ENCOUNTER — Encounter: Payer: Self-pay | Admitting: Family

## 2018-05-08 ENCOUNTER — Telehealth: Payer: Self-pay | Admitting: Family

## 2018-05-08 NOTE — Telephone Encounter (Signed)
lvm for patient to call for virtual visit follow up appointment

## 2018-05-08 NOTE — Telephone Encounter (Signed)
Please contact pt to arrange follow up visit for her blood pressure and sugar.

## 2018-05-23 MED FILL — HYDROCHLOROTHIAZIDE 12.5 MG: 12.5 | 90 days supply | Qty: 90 | Fill #1

## 2018-05-23 MED FILL — AMLODIPINE BESYLATE 10 MG T: 10 | 90 days supply | Qty: 90 | Fill #0

## 2018-05-23 MED FILL — traMADol HCL 50 MG TABS: 50 | 28 days supply | Qty: 84 | Fill #2

## 2018-06-28 MED FILL — LABETALOL HCL 200 MG TABLET: 200 | 90 days supply | Qty: 360 | Fill #2

## 2018-06-28 MED FILL — ROSUVASTATIN CALCIUM 40 MG: 40 | 90 days supply | Qty: 90 | Fill #2

## 2018-06-28 MED FILL — DICLOFENAC SODIUM 75 MG TAB: 75 | 90 days supply | Qty: 180 | Fill #1

## 2018-07-19 MED FILL — traMADol HCL 50 MG TABS: 50 | 21 days supply | Qty: 63 | Fill #3

## 2018-08-30 MED FILL — HYDROCHLOROTHIAZIDE 12.5 MG: 12.5 | 90 days supply | Qty: 90 | Fill #2

## 2018-08-30 MED FILL — OLMESARTAN-HCTZ 20-12.5 MG: 20-12.5 | 90 days supply | Qty: 90 | Fill #1

## 2018-08-30 MED FILL — traMADol HCL 50 MG TABS: 50 | 28 days supply | Qty: 84 | Fill #0

## 2018-08-30 MED FILL — METHOCARBAMOL 500 MG TABS: 500 | 30 days supply | Qty: 60 | Fill #2

## 2018-08-30 MED FILL — AMLODIPINE BESYLATE 10 MG T: 10 | 90 days supply | Qty: 90 | Fill #1

## 2018-09-10 MED FILL — METHOCARBAMOL 500 MG TABS: 500 | 30 days supply | Qty: 60 | Fill #2

## 2018-09-10 MED FILL — AMLODIPINE BESYLATE 10 MG T: 10 | 90 days supply | Qty: 90 | Fill #1

## 2018-09-10 MED FILL — HYDROCHLOROTHIAZIDE 12.5 MG: 12.5 | 90 days supply | Qty: 90 | Fill #2

## 2018-09-10 MED FILL — traMADol HCL 50 MG TABS: 50 | 28 days supply | Qty: 84 | Fill #0

## 2018-09-25 ENCOUNTER — Other Ambulatory Visit: Payer: Self-pay

## 2018-09-25 ENCOUNTER — Encounter: Payer: Self-pay | Admitting: Family Medicine

## 2018-09-25 ENCOUNTER — Ambulatory Visit (INDEPENDENT_AMBULATORY_CARE_PROVIDER_SITE_OTHER): Payer: 59 | Admitting: Family Medicine

## 2018-09-25 ENCOUNTER — Ambulatory Visit: Payer: Medicare Other | Admitting: Family Medicine

## 2018-09-25 DIAGNOSIS — M170 Bilateral primary osteoarthritis of knee: Secondary | ICD-10-CM

## 2018-09-25 NOTE — Patient Instructions (Signed)
Nice to meet you Please try ice  Please try tylenol  Please try the rub on medicine   Please send me a message in MyChart with any questions or updates.  Please follow up with Dr. Barbaraann Barthel for the injections.   --Dr. Raeford Razor

## 2018-09-25 NOTE — Progress Notes (Signed)
Medication Samples have been provided to the patient.  Drug name: Pennsaid       Strength: 2%        Qty: 2 Boxes  [id number]  Exp.Date: 02/2019  Dosing instructions: Use a peasize amount and rub gently  The patient has been instructed regarding the correct time, dose, and frequency of taking this medication, including desired effects and most common side effects.   Sherrie George, MA 3:57 PM 09/25/2018

## 2018-09-25 NOTE — Progress Notes (Signed)
Virginia Flores - 68 y.o. female MRN [id number]  Date of birth: 05-02-1950  SUBJECTIVE:  Including CC & ROS.  Chief Complaint  Patient presents with  . Knee Pain    bilateral knee    Virginia Flores is a 68 y.o. female that is  Presenting with acute on chronic bilateral knee pain. The pain started a few months ago. The pain is occurring in the medial aspect of the right knee. Occurring in the lateral aspect of the left knee. Has known OA and received CSI in the past. Was planning on going for surgery but COVID happened.   Review of Systems  Constitutional: Negative for fever.  HENT: Negative for congestion.   Respiratory: Negative for cough.   Cardiovascular: Negative for chest pain.  Gastrointestinal: Negative for abdominal pain.  Musculoskeletal: Positive for arthralgias and gait problem.  Skin: Negative for color change.  Neurological: Negative for weakness.  Hematological: Negative for adenopathy.    HISTORY: Past Medical, Surgical, Social, and Family History Reviewed & Updated per EMR.   Pertinent Historical Findings include:  Past Medical History:  Diagnosis Date  . Aortic stenosis   . Arthritis   . Diabetes mellitus, type 2 ([place])   . Hyperlipidemia   . Hypertension     Past Surgical History:  Procedure Laterality Date  . ABDOMINAL HYSTERECTOMY  1989  . CARDIAC CATHETERIZATION N/A 11/27/2014   Procedure: Left Heart Cath and Coronary Angiography;  Surgeon: Burnell Blanks, MD;  Location: [facility];  Service: Cardiovascular;  Laterality: N/A;  . LAPAROSCOPIC GASTRIC BANDING  2009  . TONSILLECTOMY  1979    Allergies  Allergen Reactions  . Aspirin     GI upset    Family History  Problem Relation Age of Onset  . Hypertension Paternal Grandmother   . Arthritis Other        paternal grandparents  . Hyperlipidemia Mother   . Stroke Mother        maternal grandmother  . Hypertension Mother   . Diabetes Mother   . Hyperlipidemia Father    maternal/paternal grandparents  . Heart disease Father        MI at age 40  . Hypertension Father   . CAD Brother        MI at age 9  . CAD Sister      Social History   Socioeconomic History  . Marital status: Married    Spouse name: Not on file  . Number of children: 5  . Years of education: Not on file  . Highest education level: Not on file  Occupational History  . Not on file  Social Needs  . Financial resource strain: Not on file  . Food insecurity    Worry: Not on file    Inability: Not on file  . Transportation needs    Medical: Not on file    Non-medical: Not on file  Tobacco Use  . Smoking status: Never Smoker  . Smokeless tobacco: Never Used  Substance and Sexual Activity  . Alcohol use: Yes    Alcohol/week: 0.0 standard drinks    Comment: Rare  . Drug use: No  . Sexual activity: Not on file  Lifestyle  . Physical activity    Days per week: Not on file    Minutes per session: Not on file  . Stress: Not on file  Relationships  . Social Herbalist on phone: Not on file    Gets  together: Not on file    Attends religious service: Not on file    Active member of club or organization: Not on file    Attends meetings of clubs or organizations: Not on file    Relationship status: Not on file  . Intimate partner violence    Fear of current or ex partner: Not on file    Emotionally abused: Not on file    Physically abused: Not on file    Forced sexual activity: Not on file  Other Topics Concern  . Not on file  Social History Narrative  . Not on file     PHYSICAL EXAM:  VS: BP 119/74   Ht 5\' 5"  (1.651 m)   Wt 275 lb (124.7 kg)   BMI 45.76 kg/m  Physical Exam Gen: NAD, alert, cooperative with exam, well-appearing ENT: normal lips, normal nasal mucosa,  Eye: normal EOM, normal conjunctiva and lids CV:  no edema, +2 pedal pulses   Resp: no accessory muscle use, non-labored,   Skin: no rashes, no areas of induration  Neuro: normal tone,  normal sensation to touch Psych:  normal insight, alert and oriented MSK:  Right and left knee:  No obvious effusion  TTp of the medial joint line  Valgus deformity of the right knee  Normal strength to resistance.  Instability with valgus and varus stress testing.  NVI       ASSESSMENT & PLAN:   Degenerative arthritis of knee, bilateral Acute on chronic degenerative changes. Was planning for TKA prior to COVID  - provided pennsaid samples  - counseled on HEp and supportive care - will f/u for injections.

## 2018-09-25 NOTE — Assessment & Plan Note (Signed)
Acute on chronic degenerative changes. Was planning for TKA prior to COVID  - provided pennsaid samples  - counseled on HEp and supportive care - will f/u for injections.

## 2018-10-02 ENCOUNTER — Encounter: Payer: Self-pay | Admitting: Family Medicine

## 2018-10-02 ENCOUNTER — Ambulatory Visit (INDEPENDENT_AMBULATORY_CARE_PROVIDER_SITE_OTHER): Payer: 59 | Admitting: Family Medicine

## 2018-10-02 ENCOUNTER — Other Ambulatory Visit: Payer: Self-pay

## 2018-10-02 VITALS — BP 146/69 | Ht 64.5 in | Wt 275.0 lb

## 2018-10-02 DIAGNOSIS — M170 Bilateral primary osteoarthritis of knee: Secondary | ICD-10-CM

## 2018-10-02 NOTE — Progress Notes (Signed)
Subjective:    Patient ID: Virginia Flores, female    DOB: 04/19/1950, 68 y.o.   MRN: [id number]  HPI HPI: Patient is a 68 y.o. female here for bilateral knee pain.  12/03/15: Patient reports she has known lupus and RA diagnosed this March. She takes methotrexate, tylenol, diclofenac, cytotec (has tried all of these - reports GI bleeding with diclofenac). Has had problems past year with knees giving out, anteromedial pain. [place] [address] twice in November due to this. Pain is 10/10, sharp. Had improvement following knee injection 3 years ago. No skin changes, numbness.  01/28/16: Patient returns to start supartz series.  Pain right knee 10/10, left 8/10. No skin changes, numbness.  02/03/16: Patient reports her pain is 0/10 currently. Feels better compared to last visit. No skin changes, numbness.  2/9: Patient reports pain is 6/10, a soreness. No skin changes numbness.  09/10/17: Patient states that she is having bilateral knee pain L>R. Pain is most affecting her when she gets up from rest. Pain is occasionally a 7/10 on L. States this is consistent with past presentations of osteoarthritis. She has been taking tylenol 1000mg  tid, and diclofenac 75mg  bid. Her pain is mainly located at the lateral and medial tibial condyles. She also endorses a burning/shooting pain down her left leg on hip flexion. Says that lupus is well controlled, and that she is no currently taking in medication. Had "bronchopneumonia" in 07/2017, given steroid taper. Says that she felt much better in knees at that time.  No skin changes, numbness.  10/02/18: Patient returns to start gelsyn series. No skin changes.  Past Medical History:  Diagnosis Date  . Aortic stenosis   . Arthritis   . Diabetes mellitus, type 2 ([place])   . Hyperlipidemia   . Hypertension     Current Outpatient Medications on File Prior to Visit  Medication Sig Dispense Refill  . acetaminophen (TYLENOL) 500 MG tablet Take 1,500 mg by mouth  daily as needed for mild pain.     [name] amLODipine (NORVASC) 10 MG tablet     . hydrochlorothiazide (HYDRODIURIL) 12.5 MG tablet     . labetalol (NORMODYNE) 200 MG tablet TAKE 2 TABLETS (400 MG TOTAL) BY MOUTH 2 (TWO) TIMES DAILY. 120 tablet 0  . metFORMIN (GLUCOPHAGE) 500 MG tablet Take 1,000 mg by mouth 2 (two) times daily.  0  . Multiple Vitamins-Minerals (MULTIVITAMIN WITH MINERALS) tablet Take 1 tablet by mouth daily.    [name] glucose blood (ONE TOUCH ULTRA TEST) test strip Use as instructed to check blood sugar once a day.  DX E11.65 100 each 1  . Lancets (ONETOUCH ULTRASOFT) lancets Use as instructed to check blood sugar once a day.  DX E11.6 100 each 1  . olmesartan-hydrochlorothiazide (BENICAR HCT) 20-12.5 MG tablet TAKE 1 TABLET BY MOUTH DAILY. (Patient not taking: Reported on 10/02/2018) 30 tablet 2  . OVER THE COUNTER MEDICATION CBD (Hemp OIL). Take 15mg  at bedtime.    . pioglitazone (ACTOS) 30 MG tablet Take 1 tablet (30 mg total) by mouth daily. (Patient not taking: Reported on 10/02/2018) 30 tablet 3  . rosuvastatin (CRESTOR) 40 MG tablet Take 1 tablet (40 mg total) by mouth daily. (Patient not taking: Reported on 10/02/2018) 30 tablet 5  . traMADol (ULTRAM) 50 MG tablet      No current facility-administered medications on file prior to visit.     Past Surgical History:  Procedure Laterality Date  . ABDOMINAL HYSTERECTOMY  1989  . CARDIAC CATHETERIZATION N/A  11/27/2014   Procedure: Left Heart Cath and Coronary Angiography;  Surgeon: Burnell Blanks, MD;  Location: [facility];  Service: Cardiovascular;  Laterality: N/A;  . LAPAROSCOPIC GASTRIC BANDING  2009  . TONSILLECTOMY  1979    Allergies  Allergen Reactions  . Aspirin     GI upset    Social History   Socioeconomic History  . Marital status: Married    Spouse name: Not on file  . Number of children: 5  . Years of education: Not on file  . Highest education level: Not on file  Occupational History  . Not  on file  Social Needs  . Financial resource strain: Not on file  . Food insecurity    Worry: Not on file    Inability: Not on file  . Transportation needs    Medical: Not on file    Non-medical: Not on file  Tobacco Use  . Smoking status: Never Smoker  . Smokeless tobacco: Never Used  Substance and Sexual Activity  . Alcohol use: Yes    Alcohol/week: 0.0 standard drinks    Comment: Rare  . Drug use: No  . Sexual activity: Not on file  Lifestyle  . Physical activity    Days per week: Not on file    Minutes per session: Not on file  . Stress: Not on file  Relationships  . Social Herbalist on phone: Not on file    Gets together: Not on file    Attends religious service: Not on file    Active member of club or organization: Not on file    Attends meetings of clubs or organizations: Not on file    Relationship status: Not on file  . Intimate partner violence    Fear of current or ex partner: Not on file    Emotionally abused: Not on file    Physically abused: Not on file    Forced sexual activity: Not on file  Other Topics Concern  . Not on file  Social History Narrative  . Not on file    Family History  Problem Relation Age of Onset  . Hypertension Paternal Grandmother   . Arthritis Other        paternal grandparents  . Hyperlipidemia Mother   . Stroke Mother        maternal grandmother  . Hypertension Mother   . Diabetes Mother   . Hyperlipidemia Father        maternal/paternal grandparents  . Heart disease Father        MI at age 79  . Hypertension Father   . CAD Brother        MI at age 44  . CAD Sister     BP (!) 146/69   Ht 5' 4.5" (1.638 m)   Wt 275 lb (124.7 kg)   BMI 46.47 kg/m   Review of Systems Per HPI    Objective:   Physical Exam Gen: NAD, comfortable in exam room  Rest of exam not repeated today. Cards: Warm, well-perfused. Intact pt/dp bilaterally Pulm: no increased shortness of breath, no increased work of breathing.  Cough noted MSK: Left leg: No swelling, bruising, other deformity.  Clicking and popping noted in lateral and medial knee. Negative anterior/posterior drawer. Negative McMurray's sign. Tenderness to palpation lateral and medial joint lines. Positive straight leg test.  FROM with 5/5 strength flexion and extension.  Right Leg: No swelling, bruising, other deformity.  Clicking and popping  noted in lateral and medial knee. Negative anterior/posterior drawer. Negative McMurray's sign. Tenderness to palpation lateral and medial joint lines. Negative straight leg.  FROM with 5/5 strength flexion and extension.  NVI bilateral lower extremities    Assessment & Plan:  Bilateral Knee osteoarthritis Starting viscosupplementation today.  Previously discussed tylenol, topical medications, supplements.  Given pennsaid samples a week ago.  F/u in 1 week for second injections.  After informed written consent timeout was performed, patient was seated on exam table. Right knee was prepped with alcohol swab and utilizing anteromedial approach, patient's right knee was injected intraarticularly with 37mL bupivicaine followed by gelsyn. Patient tolerated the procedure well without immediate complications.  After informed written consent timeout was performed, patient was lying supine on exam table. Left knee was prepped with alcohol swab and utilizing superolateral approach with ultrasound guidance, patient's left knee was injected intraarticularly with 51mL bupivicaine followed by gelsyn. Patient tolerated the procedure well without immediate complications.

## 2018-10-16 ENCOUNTER — Encounter: Payer: Self-pay | Admitting: Family Medicine

## 2018-10-16 ENCOUNTER — Ambulatory Visit (INDEPENDENT_AMBULATORY_CARE_PROVIDER_SITE_OTHER): Payer: 59 | Admitting: Family Medicine

## 2018-10-16 ENCOUNTER — Other Ambulatory Visit: Payer: Self-pay

## 2018-10-16 DIAGNOSIS — M1711 Unilateral primary osteoarthritis, right knee: Secondary | ICD-10-CM

## 2018-10-16 DIAGNOSIS — M1712 Unilateral primary osteoarthritis, left knee: Secondary | ICD-10-CM | POA: Diagnosis not present

## 2018-10-16 MED FILL — JANUVIA 100 MG TABLET: 100 | 30 days supply | Qty: 30 | Fill #0

## 2018-10-16 MED FILL — DICLOFENAC SODIUM 75 MG TAB: 75 | 90 days supply | Qty: 180 | Fill #0

## 2018-10-16 MED FILL — LABETALOL HCL 200 MG TABLET: 200 | 90 days supply | Qty: 360 | Fill #3

## 2018-10-16 MED FILL — traMADol HCL 50 MG TABS: 50 | 28 days supply | Qty: 84 | Fill #1

## 2018-10-17 ENCOUNTER — Encounter: Payer: Self-pay | Admitting: Family Medicine

## 2018-10-17 NOTE — Progress Notes (Signed)
Subjective:    Patient ID: Virginia Flores, female    DOB: 05-Jan-1950, 68 y.o.   MRN: [id number]  HPI HPI: Patient is a 68 y.o. female here for bilateral knee pain.  12/03/15: Patient reports she has known lupus and RA diagnosed this March. She takes methotrexate, tylenol, diclofenac, cytotec (has tried all of these - reports GI bleeding with diclofenac). Has had problems past year with knees giving out, anteromedial pain. [place] [address] twice in November due to this. Pain is 10/10, sharp. Had improvement following knee injection 3 years ago. No skin changes, numbness.  01/28/16: Patient returns to start supartz series.  Pain right knee 10/10, left 8/10. No skin changes, numbness.  02/03/16: Patient reports her pain is 0/10 currently. Feels better compared to last visit. No skin changes, numbness.  2/9: Patient reports pain is 6/10, a soreness. No skin changes numbness.  09/10/17: Patient states that she is having bilateral knee pain L>R. Pain is most affecting her when she gets up from rest. Pain is occasionally a 7/10 on L. States this is consistent with past presentations of osteoarthritis. She has been taking tylenol 1000mg  tid, and diclofenac 75mg  bid. Her pain is mainly located at the lateral and medial tibial condyles. She also endorses a burning/shooting pain down her left leg on hip flexion. Says that lupus is well controlled, and that she is no currently taking in medication. Had "bronchopneumonia" in 07/2017, given steroid taper. Says that she felt much better in knees at that time.  No skin changes, numbness.  10/02/18: Patient returns to start gelsyn series. No skin changes.  10/14: Patient returns for second gelsyn injections. No skin changes.  Past Medical History:  Diagnosis Date  . Aortic stenosis   . Arthritis   . Diabetes mellitus, type 2 ([place])   . Hyperlipidemia   . Hypertension     Current Outpatient Medications on File Prior to Visit  Medication Sig  Dispense Refill  . acetaminophen (TYLENOL) 500 MG tablet Take 1,500 mg by mouth daily as needed for mild pain.     [name] amLODipine (NORVASC) 10 MG tablet     . glucose blood (ONE TOUCH ULTRA TEST) test strip Use as instructed to check blood sugar once a day.  DX E11.65 100 each 1  . hydrochlorothiazide (HYDRODIURIL) 12.5 MG tablet     . labetalol (NORMODYNE) 200 MG tablet TAKE 2 TABLETS (400 MG TOTAL) BY MOUTH 2 (TWO) TIMES DAILY. 120 tablet 0  . Lancets (ONETOUCH ULTRASOFT) lancets Use as instructed to check blood sugar once a day.  DX E11.6 100 each 1  . metFORMIN (GLUCOPHAGE) 500 MG tablet Take 1,000 mg by mouth 2 (two) times daily.  0  . Multiple Vitamins-Minerals (MULTIVITAMIN WITH MINERALS) tablet Take 1 tablet by mouth daily.    [name] olmesartan-hydrochlorothiazide (BENICAR HCT) 20-12.5 MG tablet TAKE 1 TABLET BY MOUTH DAILY. (Patient not taking: Reported on 10/02/2018) 30 tablet 2  . OVER THE COUNTER MEDICATION CBD (Hemp OIL). Take 15mg  at bedtime.    . pioglitazone (ACTOS) 30 MG tablet Take 1 tablet (30 mg total) by mouth daily. (Patient not taking: Reported on 10/02/2018) 30 tablet 3  . rosuvastatin (CRESTOR) 40 MG tablet Take 1 tablet (40 mg total) by mouth daily. (Patient not taking: Reported on 10/02/2018) 30 tablet 5  . traMADol (ULTRAM) 50 MG tablet      No current facility-administered medications on file prior to visit.     Past Surgical History:  Procedure Laterality Date  .  ABDOMINAL HYSTERECTOMY  1989  . CARDIAC CATHETERIZATION N/A 11/27/2014   Procedure: Left Heart Cath and Coronary Angiography;  Surgeon: Burnell Blanks, MD;  Location: [facility];  Service: Cardiovascular;  Laterality: N/A;  . LAPAROSCOPIC GASTRIC BANDING  2009  . TONSILLECTOMY  1979    Allergies  Allergen Reactions  . Aspirin     GI upset    Social History   Socioeconomic History  . Marital status: Married    Spouse name: Not on file  . Number of children: 5  . Years of education:  Not on file  . Highest education level: Not on file  Occupational History  . Not on file  Social Needs  . Financial resource strain: Not on file  . Food insecurity    Worry: Not on file    Inability: Not on file  . Transportation needs    Medical: Not on file    Non-medical: Not on file  Tobacco Use  . Smoking status: Never Smoker  . Smokeless tobacco: Never Used  Substance and Sexual Activity  . Alcohol use: Yes    Alcohol/week: 0.0 standard drinks    Comment: Rare  . Drug use: No  . Sexual activity: Not on file  Lifestyle  . Physical activity    Days per week: Not on file    Minutes per session: Not on file  . Stress: Not on file  Relationships  . Social Herbalist on phone: Not on file    Gets together: Not on file    Attends religious service: Not on file    Active member of club or organization: Not on file    Attends meetings of clubs or organizations: Not on file    Relationship status: Not on file  . Intimate partner violence    Fear of current or ex partner: Not on file    Emotionally abused: Not on file    Physically abused: Not on file    Forced sexual activity: Not on file  Other Topics Concern  . Not on file  Social History Narrative  . Not on file    Family History  Problem Relation Age of Onset  . Hypertension Paternal Grandmother   . Arthritis Other        paternal grandparents  . Hyperlipidemia Mother   . Stroke Mother        maternal grandmother  . Hypertension Mother   . Diabetes Mother   . Hyperlipidemia Father        maternal/paternal grandparents  . Heart disease Father        MI at age 45  . Hypertension Father   . CAD Brother        MI at age 58  . CAD Sister     BP 120/68   Ht 5\' 4"  (1.626 m)   Wt 275 lb (124.7 kg)   BMI 47.20 kg/m   Review of Systems Per HPI    Objective:   Physical Exam Gen: NAD, comfortable in exam room  Rest of exam not repeated today. Cards: Warm, well-perfused. Intact pt/dp  bilaterally Pulm: no increased shortness of breath, no increased work of breathing. Cough noted MSK: Left leg: No swelling, bruising, other deformity.  Clicking and popping noted in lateral and medial knee. Negative anterior/posterior drawer. Negative McMurray's sign. Tenderness to palpation lateral and medial joint lines. Positive straight leg test.  FROM with 5/5 strength flexion and extension.  Right Leg: No  swelling, bruising, other deformity.  Clicking and popping noted in lateral and medial knee. Negative anterior/posterior drawer. Negative McMurray's sign. Tenderness to palpation lateral and medial joint lines. Negative straight leg.  FROM with 5/5 strength flexion and extension.  NVI bilateral lower extremities    Assessment & Plan:  Bilateral Knee osteoarthritis Second gelsyn injections given today.  Previously discussed tylenol, topical medications, supplements.  F/u in 1 week for third injections.  After informed written consent timeout was performed, patient was seated on exam table. Right knee was prepped with alcohol swab and utilizing anteromedial approach, patient's right knee was injected intraarticularly with 84mL bupivicaine followed by gelsyn. Patient tolerated the procedure well without immediate complications.  After informed written consent timeout was performed, patient was seated on exam table. Left knee was prepped with alcohol swab and utilizing anteromedial approach, patient's left knee was injected intraarticularly with 71mL bupivicaine followed by gelsyn. Patient tolerated the procedure well without immediate complications.

## 2018-10-18 ENCOUNTER — Telehealth: Payer: Self-pay | Admitting: Family

## 2018-10-18 NOTE — Telephone Encounter (Signed)
Pt is past due for follow up. Please contact pt to schedule OV.

## 2018-10-18 NOTE — Telephone Encounter (Signed)
Called Pt LVM to call back and schedule appt

## 2018-10-23 ENCOUNTER — Encounter: Payer: Self-pay | Admitting: Family Medicine

## 2018-10-23 ENCOUNTER — Ambulatory Visit (INDEPENDENT_AMBULATORY_CARE_PROVIDER_SITE_OTHER): Payer: 59 | Admitting: Family Medicine

## 2018-10-23 ENCOUNTER — Other Ambulatory Visit: Payer: Self-pay

## 2018-10-23 DIAGNOSIS — M170 Bilateral primary osteoarthritis of knee: Secondary | ICD-10-CM

## 2018-10-23 NOTE — Progress Notes (Signed)
Subjective:    Patient ID: Virginia Flores, female    DOB: 10-24-1950, 68 y.o.   MRN: [id number]  HPI HPI: Patient is a 68 y.o. female here for bilateral knee pain.  12/03/15: Patient reports she has known lupus and RA diagnosed this March. She takes methotrexate, tylenol, diclofenac, cytotec (has tried all of these - reports GI bleeding with diclofenac). Has had problems past year with knees giving out, anteromedial pain. [place] twice in November due to this. Pain is 10/10, sharp. Had improvement following knee injection 3 years ago. No skin changes, numbness.  01/28/16: Patient returns to start supartz series.  Pain right knee 10/10, left 8/10. No skin changes, numbness.  02/03/16: Patient reports her pain is 0/10 currently. Feels better compared to last visit. No skin changes, numbness.  2/9: Patient reports pain is 6/10, a soreness. No skin changes numbness.  09/10/17: Patient states that she is having bilateral knee pain L>R. Pain is most affecting her when she gets up from rest. Pain is occasionally a 7/10 on L. States this is consistent with past presentations of osteoarthritis. She has been taking tylenol 1000mg  tid, and diclofenac 75mg  bid. Her pain is mainly located at the lateral and medial tibial condyles. She also endorses a burning/shooting pain down her left leg on hip flexion. Says that lupus is well controlled, and that she is no currently taking in medication. Had "bronchopneumonia" in 07/2017, given steroid taper. Says that she felt much better in knees at that time.  No skin changes, numbness.  10/02/18: Patient returns to start gelsyn series. No skin changes.  10/14: Patient returns for second gelsyn injections. No skin changes.  10/21: Patient reports she's doing well overall. No skin changes.  Past Medical History:  Diagnosis Date  . Aortic stenosis   . Arthritis   . Diabetes mellitus, type 2 ([place])   . Hyperlipidemia   . Hypertension      Current Outpatient Medications on File Prior to Visit  Medication Sig Dispense Refill  . acetaminophen (TYLENOL) 500 MG tablet Take 1,500 mg by mouth daily as needed for mild pain.     [name] amLODipine (NORVASC) 10 MG tablet     . glucose blood (ONE TOUCH ULTRA TEST) test strip Use as instructed to check blood sugar once a day.  DX E11.65 100 each 1  . hydrochlorothiazide (HYDRODIURIL) 12.5 MG tablet     . labetalol (NORMODYNE) 200 MG tablet TAKE 2 TABLETS (400 MG TOTAL) BY MOUTH 2 (TWO) TIMES DAILY. 120 tablet 0  . Lancets (ONETOUCH ULTRASOFT) lancets Use as instructed to check blood sugar once a day.  DX E11.6 100 each 1  . metFORMIN (GLUCOPHAGE) 500 MG tablet Take 1,000 mg by mouth 2 (two) times daily.  0  . Multiple Vitamins-Minerals (MULTIVITAMIN WITH MINERALS) tablet Take 1 tablet by mouth daily.    [name] olmesartan-hydrochlorothiazide (BENICAR HCT) 20-12.5 MG tablet TAKE 1 TABLET BY MOUTH DAILY. (Patient not taking: Reported on 10/02/2018) 30 tablet 2  . OVER THE COUNTER MEDICATION CBD (Hemp OIL). Take 15mg  at bedtime.    . pioglitazone (ACTOS) 30 MG tablet Take 1 tablet (30 mg total) by mouth daily. (Patient not taking: Reported on 10/02/2018) 30 tablet 3  . rosuvastatin (CRESTOR) 40 MG tablet Take 1 tablet (40 mg total) by mouth daily. (Patient not taking: Reported on 10/02/2018) 30 tablet 5  . traMADol (ULTRAM) 50 MG tablet      No current facility-administered medications on file prior to visit.  Past Surgical History:  Procedure Laterality Date  . ABDOMINAL HYSTERECTOMY  1989  . CARDIAC CATHETERIZATION N/A 11/27/2014   Procedure: Left Heart Cath and Coronary Angiography;  Surgeon: Burnell Blanks, MD;  Location: [place] [facility];  Service: Cardiovascular;  Laterality: N/A;  . LAPAROSCOPIC GASTRIC BANDING  2009  . TONSILLECTOMY  1979    Allergies  Allergen Reactions  . Aspirin     GI upset    Social History   Socioeconomic History  . Marital status: Married     Spouse name: Not on file  . Number of children: 5  . Years of education: Not on file  . Highest education level: Not on file  Occupational History  . Not on file  Social Needs  . Financial resource strain: Not on file  . Food insecurity    Worry: Not on file    Inability: Not on file  . Transportation needs    Medical: Not on file    Non-medical: Not on file  Tobacco Use  . Smoking status: Never Smoker  . Smokeless tobacco: Never Used  Substance and Sexual Activity  . Alcohol use: Yes    Alcohol/week: 0.0 standard drinks    Comment: Rare  . Drug use: No  . Sexual activity: Not on file  Lifestyle  . Physical activity    Days per week: Not on file    Minutes per session: Not on file  . Stress: Not on file  Relationships  . Social Herbalist on phone: Not on file    Gets together: Not on file    Attends religious service: Not on file    Active member of club or organization: Not on file    Attends meetings of clubs or organizations: Not on file    Relationship status: Not on file  . Intimate partner violence    Fear of current or ex partner: Not on file    Emotionally abused: Not on file    Physically abused: Not on file    Forced sexual activity: Not on file  Other Topics Concern  . Not on file  Social History Narrative  . Not on file    Family History  Problem Relation Age of Onset  . Hypertension Paternal Grandmother   . Arthritis Other        paternal grandparents  . Hyperlipidemia Mother   . Stroke Mother        maternal grandmother  . Hypertension Mother   . Diabetes Mother   . Hyperlipidemia Father        maternal/paternal grandparents  . Heart disease Father        MI at age 83  . Hypertension Father   . CAD Brother        MI at age 51  . CAD Sister     BP 126/63   Review of Systems Per HPI    Objective:   Physical Exam Gen: NAD, comfortable in exam room  Rest of exam not repeated today. Cards: Warm, well-perfused. Intact  pt/dp bilaterally Pulm: no increased shortness of breath, no increased work of breathing. Cough noted MSK: Left leg: No swelling, bruising, other deformity.  Clicking and popping noted in lateral and medial knee. Negative anterior/posterior drawer. Negative McMurray's sign. Tenderness to palpation lateral and medial joint lines. Positive straight leg test.  FROM with 5/5 strength flexion and extension.  Right Leg: No swelling, bruising, other deformity.  Clicking and popping noted in  lateral and medial knee. Negative anterior/posterior drawer. Negative McMurray's sign. Tenderness to palpation lateral and medial joint lines. Negative straight leg.  FROM with 5/5 strength flexion and extension.  NVI bilateral lower extremities    Assessment & Plan:  Bilateral Knee osteoarthritis  Third gelsyn injections given today.  Previously discussed tylenol, topical medications, supplements.  Let us know how she's doing in about 4 weeks.  Can consider 4th injection if desired.    After informed written consent timeout was performed, patient was seated on exam table. Right knee was prepped with alcohol swab and utilizing anteromedial approach, patient's right knee was injected intraarticularly with 62mL bupivicaine followed by gelsyn. Patient tolerated the procedure well without immediate complications.  After informed written consent timeout was performed, patient was seated on exam table. Left knee was prepped with alcohol swab and utilizing anteromedial approach, patient's left knee was injected intraarticularly with 31mL bupivicaine followed by gelsyn. Patient tolerated the procedure well without immediate complications.

## 2018-12-24 MED FILL — HYDROCHLOROTHIAZIDE 12.5 MG: 12.5 | 90 days supply | Qty: 90 | Fill #0

## 2018-12-24 MED FILL — JANUVIA 100 MG TABLET: 100 | 30 days supply | Qty: 30 | Fill #1

## 2018-12-24 MED FILL — AMLODIPINE BESYLATE 10 MG T: 10 | 90 days supply | Qty: 90 | Fill #2

## 2019-01-14 MED FILL — DICLOFENAC SODIUM 75 MG TAB: 75 | 90 days supply | Qty: 180 | Fill #1

## 2019-01-14 MED FILL — AMLODIPINE BESYLATE 10 MG T: 10 | 90 days supply | Qty: 90 | Fill #2

## 2019-01-14 MED FILL — JANUVIA 100 MG TABLET: 100 | 30 days supply | Qty: 30 | Fill #1

## 2019-01-16 MED FILL — ROSUVASTATIN CALCIUM 40 MG: 40 | 90 days supply | Qty: 90 | Fill #0

## 2019-01-24 MED FILL — JANUVIA 100 MG TABLET: 100 | 30 days supply | Qty: 30 | Fill #1

## 2019-01-24 MED FILL — DICLOFENAC SODIUM 75 MG TAB: 75 | 90 days supply | Qty: 180 | Fill #1

## 2019-01-24 MED FILL — ROSUVASTATIN CALCIUM 40 MG: 40 | 90 days supply | Qty: 90 | Fill #0

## 2019-01-24 MED FILL — HYDROCHLOROTHIAZIDE 12.5 MG: 12.5 | 90 days supply | Qty: 90 | Fill #0

## 2019-01-24 MED FILL — LABETALOL HCL 200 MG TABS: 200 | 90 days supply | Qty: 360 | Fill #0

## 2019-01-24 MED FILL — AMLODIPINE BESYLATE 10 MG T: 10 | 90 days supply | Qty: 90 | Fill #2

## 2019-03-06 MED FILL — metFORMIN HCL 1000 MG TABS: 1000 | 90 days supply | Qty: 180 | Fill #0

## 2019-03-06 MED FILL — traMADol HCL 50 MG TABS: 50 | 7 days supply | Qty: 21 | Fill #0

## 2019-03-06 MED FILL — FUROSEMIDE 20 MG TAB: 20 | 90 days supply | Qty: 90 | Fill #0
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
## 2019-03-12 ENCOUNTER — Ambulatory Visit (INDEPENDENT_AMBULATORY_CARE_PROVIDER_SITE_OTHER): Payer: 59 | Admitting: Orthopaedic Surgery

## 2019-03-12 ENCOUNTER — Encounter: Payer: Self-pay | Admitting: Orthopaedic Surgery

## 2019-03-12 ENCOUNTER — Encounter: Payer: Self-pay | Admitting: *Deleted

## 2019-03-12 ENCOUNTER — Other Ambulatory Visit: Payer: Self-pay

## 2019-03-12 ENCOUNTER — Ambulatory Visit: Payer: Self-pay

## 2019-03-12 ENCOUNTER — Ambulatory Visit (INDEPENDENT_AMBULATORY_CARE_PROVIDER_SITE_OTHER): Payer: 59

## 2019-03-12 ENCOUNTER — Encounter: Payer: Self-pay | Admitting: Cardiology

## 2019-03-12 ENCOUNTER — Ambulatory Visit (INDEPENDENT_AMBULATORY_CARE_PROVIDER_SITE_OTHER): Payer: 59 | Admitting: Cardiology

## 2019-03-12 VITALS — Ht 64.5 in | Wt 292.0 lb

## 2019-03-12 VITALS — BP 136/88 | HR 78 | Ht 64.5 in | Wt 292.0 lb

## 2019-03-12 DIAGNOSIS — M1712 Unilateral primary osteoarthritis, left knee: Secondary | ICD-10-CM | POA: Diagnosis not present

## 2019-03-12 DIAGNOSIS — M1711 Unilateral primary osteoarthritis, right knee: Secondary | ICD-10-CM | POA: Diagnosis not present

## 2019-03-12 DIAGNOSIS — I10 Essential (primary) hypertension: Secondary | ICD-10-CM | POA: Diagnosis not present

## 2019-03-12 DIAGNOSIS — M25562 Pain in left knee: Secondary | ICD-10-CM | POA: Diagnosis not present

## 2019-03-12 DIAGNOSIS — G8929 Other chronic pain: Secondary | ICD-10-CM | POA: Diagnosis not present

## 2019-03-12 DIAGNOSIS — R0602 Shortness of breath: Secondary | ICD-10-CM

## 2019-03-12 DIAGNOSIS — M25561 Pain in right knee: Secondary | ICD-10-CM

## 2019-03-12 DIAGNOSIS — R002 Palpitations: Secondary | ICD-10-CM

## 2019-03-12 DIAGNOSIS — I359 Nonrheumatic aortic valve disorder, unspecified: Secondary | ICD-10-CM

## 2019-03-12 DIAGNOSIS — R0683 Snoring: Secondary | ICD-10-CM

## 2019-03-12 DIAGNOSIS — R072 Precordial pain: Secondary | ICD-10-CM | POA: Diagnosis not present

## 2019-03-12 MED ORDER — LABETALOL HCL 300 MG PO TABS: 300.0000 mg | ORAL_TABLET | Freq: Two times a day (BID) | ORAL | 3 refills | Status: DC

## 2019-03-12 MED FILL — LABETALOL HCL 300 MG TABS: 300 | 90 days supply | Qty: 180 | Fill #0

## 2019-03-12 NOTE — Patient Instructions (Signed)
Medication Instructions:  CHANGE LABETALOL TO 300 MG TWICE DAILY= 1 AND 1/2 OF THE 200 MG TABLET TWICE DAILY  *If you need a refill on your cardiac medications before your next appointment, please call your pharmacy*   Lab Work: If you have labs (blood work) drawn today and your tests are completely normal, you will receive your results only by: [name] MyChart Message (if you have MyChart) OR . A paper copy in the mail If you have any lab test that is abnormal or we need to change your treatment, we will call you to review the results.   Testing/Procedures: Your physician has requested that you have an echocardiogram. Echocardiography is a painless test that uses sound waves to create images of your heart. It provides your doctor with information about the size and shape of your heart and how well your heart's chambers and valves are working. This procedure takes approximately one hour. There are no restrictions for this procedure.[facility] has requested that you have a lexiscan myoview. For further information please visit [email or web address]. Please follow instruction sheet, as given.Story    Follow-Up: At [facility], you and your health needs are our priority.  As part of our continuing mission to provide you with exceptional heart care, we have created designated Provider Care Teams.  These Care Teams include your primary Cardiologist (physician) and Advanced Practice Providers (APPs -  Physician Assistants and Nurse Practitioners) who all work together to provide you with the care you need, when you need it.  We recommend signing up for the patient portal called "MyChart".  Sign up information is provided on this After Visit Summary.  MyChart is used to connect with patients for Virtual Visits (Telemedicine).  Patients are able to view lab/test results, encounter notes, upcoming appointments, etc.  Non-urgent messages can be sent to your  provider as well.   To learn more about what you can do with MyChart, go to [email or web address].    Your next appointment:   6 month(s)  The format for your next appointment:   Either In Person or Virtual  Provider:   Kirk Ruths, MD
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
## 2019-03-21 MED FILL — traMADol HCL 50 MG TABS: 50 | 30 days supply | Qty: 90 | Fill #1

## 2019-03-25 ENCOUNTER — Institutional Professional Consult (permissible substitution): Payer: 59 | Admitting: Pulmonary Disease

## 2019-03-26 ENCOUNTER — Telehealth (HOSPITAL_COMMUNITY): Payer: Self-pay

## 2019-03-26 NOTE — Telephone Encounter (Signed)
Attempted to contact the patient, detailed instructions left for the patient. Asked to call back with any questions. S.Edvardo Honse EMTP

## 2019-04-01 ENCOUNTER — Ambulatory Visit (HOSPITAL_BASED_OUTPATIENT_CLINIC_OR_DEPARTMENT_OTHER): Payer: 59

## 2019-04-01 ENCOUNTER — Ambulatory Visit (HOSPITAL_COMMUNITY): Payer: 59 | Attending: Cardiology

## 2019-04-01 ENCOUNTER — Other Ambulatory Visit: Payer: Self-pay

## 2019-04-01 DIAGNOSIS — R0602 Shortness of breath: Secondary | ICD-10-CM | POA: Insufficient documentation

## 2019-04-01 DIAGNOSIS — R072 Precordial pain: Secondary | ICD-10-CM

## 2019-04-02 ENCOUNTER — Ambulatory Visit (HOSPITAL_COMMUNITY): Payer: 59 | Attending: Cardiovascular Disease

## 2019-04-02 LAB — MYOCARDIAL PERFUSION IMAGING
LV dias vol: 84 mL (ref 46–106)
LV sys vol: 36 mL
Peak HR: 81 {beats}/min
Rest HR: 68 {beats}/min
SDS: 2
SRS: 0
SSS: 2
TID: 1.25

## 2019-04-02 MED ORDER — TECHNETIUM TC 99M TETROFOSMIN IV KIT
31.7000 | PACK | Freq: Once | INTRAVENOUS | Status: AC | PRN
  Administered 2019-04-02: 31.7 via INTRAVENOUS
  Filled 2019-04-02: qty 32

## 2019-04-24 ENCOUNTER — Institutional Professional Consult (permissible substitution): Payer: 59 | Admitting: Internal Medicine

## 2019-05-22 MED FILL — DICLOFENAC SODIUM 75 MG TAB: 75 | 90 days supply | Qty: 180 | Fill #2

## 2019-05-22 MED FILL — traMADol HCL 50 MG TABS: 50 | 30 days supply | Qty: 90 | Fill #2

## 2019-05-22 MED FILL — JANUVIA 100 MG TABLET: 100 | 30 days supply | Qty: 30 | Fill #2

## 2019-05-22 MED FILL — LABETALOL HCL 300 MG TABS: 300 | 90 days supply | Qty: 180 | Fill #0

## 2019-05-22 MED FILL — HYDROCHLOROTHIAZIDE 12.5 MG: 12.5 | 90 days supply | Qty: 90 | Fill #1

## 2019-06-12 ENCOUNTER — Encounter: Payer: Self-pay | Admitting: Orthopaedic Surgery

## 2019-06-12 ENCOUNTER — Ambulatory Visit (INDEPENDENT_AMBULATORY_CARE_PROVIDER_SITE_OTHER): Payer: 59 | Admitting: Orthopaedic Surgery

## 2019-06-12 ENCOUNTER — Other Ambulatory Visit: Payer: Self-pay

## 2019-06-12 VITALS — Wt 271.0 lb

## 2019-06-12 DIAGNOSIS — G8929 Other chronic pain: Secondary | ICD-10-CM

## 2019-06-12 DIAGNOSIS — M25561 Pain in right knee: Secondary | ICD-10-CM | POA: Diagnosis not present

## 2019-06-12 DIAGNOSIS — M1712 Unilateral primary osteoarthritis, left knee: Secondary | ICD-10-CM

## 2019-06-12 DIAGNOSIS — M25562 Pain in left knee: Secondary | ICD-10-CM

## 2019-06-12 DIAGNOSIS — M1711 Unilateral primary osteoarthritis, right knee: Secondary | ICD-10-CM

## 2019-06-12 NOTE — Progress Notes (Signed)
The patient is well-known to me.  She has severe end-stage arthritis of both knees.  Her right one hurts worse than left but they both hurt quite a bit.  She is someone who is morbidly obese and before had a hemoglobin A1c over 8.  Her last BMI was 49.  Since I saw her 3 months ago she is worked diligently on weight loss and blood glucose control.  Her hemoglobin A1c is now down to 6.7.  She is lost weight.  Her BMI is now down to 45.  Her pain is daily with both her knees.  They have now detrimentally affected her mobility, her quality of life, and her actives daily living.  She is 69 years old and actually still works.  She is very motivated.  She had no other acute change in her medical status.  She has worked on activity modification and weight loss.  Her pain is now detrimentally affecting her mobility, her quality of life and her actives daily living.  She currently denies any headache, chest pain, shortness of breath, fever, chills, nausea, vomiting  On examination both knees are small knees.  Both have varus malalignment.  Both knees have significant patellofemoral crepitation and medial joint line tenderness.  Previous x-rays of both knees shows severe end-stage arthritis with complete loss of medial joint space of both knees.  There is varus malalignment with periarticular osteophytes in all 3 compartments.  There is severe patellofemoral disease with both knees.  At this point I did share with her knee replacement model and talked about knee replacement surgery.  I feel comfortable with going ahead and scheduling the surgery given the fact that her hemoglobin A1c has come down somewhat and she is lost significant weight.  I encouraged her to continue to lose weight.  I do feel comfortable with scheduling the surgery though because she is already had in the right direction and shown that she can still do so.  I talked in detail about the surgery itself.  We talked about the risk of and is a  surgery.  I told her about what to expect with an operative and postoperative course.  All questions and concerns were answered and addressed.  We will work on getting a right total knee arthroplasty scheduled for her hopefully in the near future.

## 2019-06-25 ENCOUNTER — Other Ambulatory Visit: Payer: Self-pay

## 2019-06-26 ENCOUNTER — Telehealth: Payer: Self-pay | Admitting: Orthopaedic Surgery

## 2019-06-26 NOTE — Telephone Encounter (Signed)
Form received from [place] to [facility].

## 2019-07-11 NOTE — Patient Instructions (Addendum)
DUE TO COVID-19 ONLY ONE VISITOR ARE ALLOWED TO COME WITH YOU AND STAY IN THE WAITING ROOM ONLY DURING PRE OP AND  PROCEDURE. THEN TWO VISITORS MAY VISIT WITH YOU IN YOUR PRIVATE ROOM DURING VISITING HOURS ONLY!!   COVID SWAB TESTING MUST BE COMPLETED ON:     07-15-19 @ 3:00 PM [address], [place] -[facility] enter pre surgical testing line (Must self quarantine after testing. Follow instructions on handout.)         Your procedure is scheduled on: 07-18-19   Report to [facility] Main  Entrance   Report to admitting at 9:45 AM   Call this number if you have problems the morning of surgery [phone]   Do not eat food or drink liquids :After Midnight.      Take these medicines the morning of surgery with A SIP OF WATER: Amlodipine (Norvasc), Labetalol (Normodyne), Tramadol (Ultram)    DO NOT TAKE ANY ORAL DIABETIC MEDICATIONS DAY OF YOUR SURGERY  Oral Hygiene is also important to reduce your risk of infection.                                    Remember - BRUSH YOUR TEETH THE MORNING OF SURGERY WITH YOUR REGULAR TOOTHPASTE                               You may not have any metal on your body including hair pins, jewelry, and body piercings               Do not wear make-up, lotions, powders, perfumes, or deodorant              Do not wear nail polish.  Do not shave  48 hours prior to surgery.               Do not bring valuables to the hospital. [place].   Contacts, dentures or bridgework may not be worn into surgery.   Bring small overnight bag day of surgery.    Special Instructions: Bring a copy of your healthcare power of attorney and living will documents the day of surgery if you haven't scanned them in  before.              Please read over the following fact sheets you were given: IF YOU HAVE QUESTIONS ABOUT YOUR PRE OP INSTRUCTIONS PLEASE CALL  [phone]   How to Manage Your  Diabetes Before and After Surgery  Why is it important to control my blood sugar before and after surgery? . Improving blood sugar levels before and after surgery helps healing and can limit problems. . A way of improving blood sugar control is eating a healthy diet by: o  Eating less sugar and carbohydrates o  Increasing activity/exercise o  Talking with your doctor about reaching your blood sugar goals . High blood sugars (greater than 180 mg/dL) can raise your risk of infections and slow your recovery, so you will need to focus on controlling your diabetes during the weeks before surgery. . Make sure that the doctor who takes care of your diabetes knows about your planned surgery including the date and location.  How do I manage my blood sugar before surgery? o Check your blood sugar the morning of your  surgery  . If your blood sugar is less than 70 mg/dL, you will need to treat for low blood sugar: o Do not take insulin. o Treat a low blood sugar (less than 70 mg/dL) with  cup of clear juice (cranberry or apple), 4 glucose tablets, OR glucose gel. o Recheck blood sugar in 15 minutes after treatment (to make sure it is greater than 70 mg/dL). If your blood sugar is not greater than 70 mg/dL on recheck, call [phone] for further instructions. . Report your blood sugar to the short stay nurse when you get to Short Stay.  . If you are admitted to the hospital after surgery: o Your blood sugar will be checked by the staff and you will probably be given insulin after surgery (instead of oral diabetes medicines) to make sure you have good blood sugar levels. o The goal for blood sugar control after surgery is 80-180 mg/dL.   WHAT DO I DO ABOUT MY DIABETES MEDICATION?  [name] Do not take oral diabetes medicines (pills) the morning of surgery.  . THE DAY BEFORE SURGERY, take your usual dose of Januvia, and Metformin, prn       . THE MORNING OF SURGERY do not take Januvia, Metformin and  Trulicity.  . The day of surgery, do not take other diabetes injectables, including Byetta (exenatide), Bydureon (exenatide ER), Victoza (liraglutide), or Trulicity (dulaglutide).   Reviewed and Endorsed by [facility] Patient Education Committee, August 2015    [facility] - Preparing for Surgery Before surgery, you can play an important role.  Because skin is not sterile, your skin needs to be as free of germs as possible.  You can reduce the number of germs on your skin by washing with CHG (chlorahexidine gluconate) soap before surgery.  CHG is an antiseptic cleaner which kills germs and bonds with the skin to continue killing germs even after washing. Please DO NOT use if you have an allergy to CHG or antibacterial soaps.  If your skin becomes reddened/irritated stop using the CHG and inform your nurse when you arrive at Short Stay. Do not shave (including legs and underarms) for at least 48 hours prior to the first CHG shower.  You may shave your face/neck.  Please follow these instructions carefully:  1.  Shower with CHG Soap the night before surgery and the  morning of surgery.  2.  If you choose to wash your hair, wash your hair first as usual with your normal  shampoo.  3.  After you shampoo, rinse your hair and body thoroughly to remove the shampoo.                             4.  Use CHG as you would any other liquid soap.  You can apply chg directly to the skin and wash.  Gently with a scrungie or clean washcloth.  5.  Apply the CHG Soap to your body ONLY FROM THE NECK DOWN.   Do   not use on face/ open                           Wound or open sores. Avoid contact with eyes, ears mouth and   genitals (private parts).                       Wash face,  Genitals (private parts) with your normal  soap.             6.  Wash thoroughly, paying special attention to the area where your    surgery  will be performed.  7.  Thoroughly rinse your body with warm water from the neck down.  8.  DO  NOT shower/wash with your normal soap after using and rinsing off the CHG Soap.                9.  Pat yourself dry with a clean towel.            10.  Wear clean pajamas.            11.  Place clean sheets on your bed the night of your first shower and do not  sleep with pets. Day of Surgery : Do not apply any lotions/deodorants the morning of surgery.  Please wear clean clothes to the hospital/surgery center.  FAILURE TO FOLLOW THESE INSTRUCTIONS MAY RESULT IN THE CANCELLATION OF YOUR SURGERY  PATIENT SIGNATURE_________________________________  NURSE SIGNATURE__________________________________  ________________________________________________________________________

## 2019-07-11 NOTE — Progress Notes (Signed)
Need surgeon's orders.  PAT visit scheduled for 07-14-19.

## 2019-07-11 NOTE — Progress Notes (Addendum)
COVID  Vaccine Completed: x2 Date COVID Vaccine completed: COVID vaccine manufacturer: [place]   PCP - Dr. Doreatha Lew Cardiologist - Saw Dr. Kirk Ruths 03-12-19  No back stimulator  Chest x-ray -  EKG - 03-12-19 In Epic Stress Test - 04-01-19 In Epic ECHO - 04-01-19 In Epic Cardiac Cath - 11-27-14 In Epic  Sleep Study -  CPAP -   Fasting Blood Sugar - 100-138 Checks Blood Sugar 1x a day  Blood Thinner Instructions: Aspirin Instructions: Last Dose:  Anesthesia review:   Patient denies shortness of breath, fever, cough and chest pain at PAT appointment   Patient verbalized understanding of instructions that were given to them at the PAT appointment. Patient was also instructed that they will need to review over the PAT instructions again at home before surgery.

## 2019-07-14 ENCOUNTER — Other Ambulatory Visit: Payer: Self-pay

## 2019-07-14 ENCOUNTER — Encounter (HOSPITAL_COMMUNITY)
Admission: RE | Admit: 2019-07-14 | Discharge: 2019-07-14 | Disposition: A | Discharge status: Home / Self Care | Payer: 59 | Source: Ambulatory Visit | Attending: Orthopaedic Surgery | Admitting: Orthopaedic Surgery

## 2019-07-14 ENCOUNTER — Encounter (HOSPITAL_COMMUNITY): Payer: Self-pay

## 2019-07-14 DIAGNOSIS — Z01812 Encounter for preprocedural laboratory examination: Secondary | ICD-10-CM | POA: Diagnosis present

## 2019-07-14 LAB — SURGICAL PCR SCREEN
MRSA, PCR: NEGATIVE
Staphylococcus aureus: NEGATIVE

## 2019-07-14 LAB — CBC
HCT: 41 % (ref 36.0–46.0)
Hemoglobin: 12.6 g/dL (ref 12.0–15.0)
MCH: 22.7 pg — ABNORMAL LOW (ref 26.0–34.0)
MCHC: 30.7 g/dL (ref 30.0–36.0)
MCV: 74 fL — ABNORMAL LOW (ref 80.0–100.0)
Platelets: 215 10*3/uL (ref 150–400)
RBC: 5.54 MIL/uL — ABNORMAL HIGH (ref 3.87–5.11)
RDW: 18.1 % — ABNORMAL HIGH (ref 11.5–15.5)
WBC: 6.4 10*3/uL (ref 4.0–10.5)
nRBC: 0 % (ref 0.0–0.2)

## 2019-07-14 LAB — BASIC METABOLIC PANEL
Anion gap: 7 (ref 5–15)
BUN: 36 mg/dL — ABNORMAL HIGH (ref 8–23)
CO2: 29 mmol/L (ref 22–32)
Calcium: 9.3 mg/dL (ref 8.9–10.3)
Chloride: 106 mmol/L (ref 98–111)
Creatinine, Ser: 1.15 mg/dL — ABNORMAL HIGH (ref 0.44–1.00)
GFR calc Af Amer: 55 mL/min — ABNORMAL LOW (ref >60)
GFR calc non Af Amer: 48 mL/min — ABNORMAL LOW (ref >60)
Glucose, Bld: 111 mg/dL — ABNORMAL HIGH (ref 70–99)
Potassium: 4.3 mmol/L (ref 3.5–5.1)
Sodium: 142 mmol/L (ref 135–145)

## 2019-07-14 LAB — HEMOGLOBIN A1C
Hgb A1c MFr Bld: 6.5 % — ABNORMAL HIGH (ref 4.8–5.6)
Mean Plasma Glucose: 139.85 mg/dL

## 2019-07-14 LAB — GLUCOSE, CAPILLARY: Glucose-Capillary: 106 mg/dL — ABNORMAL HIGH (ref 70–99)

## 2019-07-14 NOTE — Progress Notes (Signed)
BMP results sent to Dr. Ninfa Linden to review

## 2019-07-15 ENCOUNTER — Other Ambulatory Visit: Payer: Self-pay | Admitting: Physician Assistant

## 2019-07-15 ENCOUNTER — Other Ambulatory Visit (HOSPITAL_COMMUNITY)
Admission: RE | Admit: 2019-07-15 | Discharge: 2019-07-15 | Disposition: A | Discharge status: Home / Self Care | Payer: 59 | Source: Ambulatory Visit | Attending: Orthopaedic Surgery | Admitting: Orthopaedic Surgery

## 2019-07-15 DIAGNOSIS — Z20822 Contact with and (suspected) exposure to covid-19: Secondary | ICD-10-CM | POA: Diagnosis present

## 2019-07-15 LAB — SARS CORONAVIRUS 2 (TAT 6-24 HRS): SARS Coronavirus 2: NEGATIVE
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
## 2019-07-16 MED FILL — traMADol HCL 50 MG TABS: 50 | 30 days supply | Qty: 90 | Fill #3

## 2019-07-16 MED FILL — AMLODIPINE BESYLATE 10 MG T: 10 | 90 days supply | Qty: 90 | Fill #0

## 2019-07-16 MED FILL — JANUVIA 100 MG TABLET: 100 | 30 days supply | Qty: 30 | Fill #0

## 2019-07-17 MED ORDER — DEXTROSE 5 % IV SOLN
3.0000 g | INTRAVENOUS | Status: AC
  Administered 2019-07-18: 3 g via INTRAVENOUS
  Filled 2019-07-17: qty 3
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
## 2019-07-17 NOTE — Anesthesia Preprocedure Evaluation (Addendum)
Anesthesia Evaluation  Patient identified by MRN, date of birth, ID band Patient awake    Reviewed: Allergy & Precautions, NPO status , Patient's Chart, lab work & pertinent test results  Airway Mallampati: II  TM Distance: >3 FB Neck ROM: Full    Dental no notable dental hx. (+) Dental Advisory Given, Teeth Intact, Chipped, Poor Dentition   Pulmonary shortness of breath, sleep apnea ,    Pulmonary exam normal breath sounds clear to auscultation       Cardiovascular hypertension, Pt. on medications Normal cardiovascular exam+ Valvular Problems/Murmurs AS  Rhythm:Regular Rate:Normal  04/01/19 Echo  1. Left ventricular ejection fraction, by estimation, is 60 to 65%. The  left ventricle has normal function. The left ventricle has no regional  wall motion abnormalities. There is moderate left ventricular hypertrophy.  Left ventricular diastolic  parameters are consistent with Grade II diastolic dysfunction  (pseudonormalization). Elevated left atrial pressure.  2. Right ventricular systolic function is normal. The right ventricular  size is mildly enlarged. Tricuspid regurgitation signal is inadequate for  assessing PA pressure.  3. The mitral valve is abnormal. Moderate mitral annular calcification.  Trivial mitral valve regurgitation.  4. The aortic valve was not well visualized. Aortic valve regurgitation  is mild. Mild aortic valve stenosis.   Neuro/Psych  Neuromuscular disease negative psych ROS   GI/Hepatic Neg liver ROS, GERD  ,  Endo/Other  diabetes, Type 2, Oral Hypoglycemic Agents  Renal/GU      Musculoskeletal  (+) Arthritis ,   Abdominal (+) + obese,   Peds  Hematology   Anesthesia Other Findings   Reproductive/Obstetrics                            Anesthesia Physical Anesthesia Plan  ASA: III  Anesthesia Plan: Spinal   Post-op Pain Management:  Regional for Post-op pain    Induction:   PONV Risk Score and Plan: 3 and Treatment may vary due to age or medical condition, Midazolam, Ondansetron and Dexamethasone  Airway Management Planned: Natural Airway and Nasal Cannula  Additional Equipment: None  Intra-op Plan:   Post-operative Plan:   Informed Consent: I have reviewed the patients History and Physical, chart, labs and discussed the procedure including the risks, benefits and alternatives for the proposed anesthesia with the patient or authorized representative who has indicated his/her understanding and acceptance.     Dental advisory given  Plan Discussed with:   Anesthesia Plan Comments: (Spinal w R Adductor canal block)       Anesthesia Quick Evaluation

## 2019-07-18 ENCOUNTER — Observation Stay (HOSPITAL_COMMUNITY): Payer: 59

## 2019-07-18 ENCOUNTER — Ambulatory Visit (HOSPITAL_COMMUNITY): Payer: 59 | Admitting: Certified Registered Nurse Anesthetist

## 2019-07-18 ENCOUNTER — Encounter (HOSPITAL_COMMUNITY): Payer: Self-pay | Admitting: Orthopaedic Surgery

## 2019-07-18 ENCOUNTER — Other Ambulatory Visit: Payer: Self-pay

## 2019-07-18 ENCOUNTER — Inpatient Hospital Stay (HOSPITAL_COMMUNITY)
Admission: RE | Admit: 2019-07-18 | Discharge: 2019-07-20 | DRG: 470 | Disposition: A | Discharge status: Home Health | Payer: 59 | Attending: Orthopaedic Surgery | Admitting: Orthopaedic Surgery

## 2019-07-18 ENCOUNTER — Encounter (HOSPITAL_COMMUNITY)
Admission: RE | Disposition: A | Discharge status: Home Health | Payer: Self-pay | Source: Home / Self Care | Attending: Orthopaedic Surgery

## 2019-07-18 ENCOUNTER — Ambulatory Visit (HOSPITAL_COMMUNITY): Payer: 59 | Admitting: Physician Assistant

## 2019-07-18 DIAGNOSIS — Z96651 Presence of right artificial knee joint: Secondary | ICD-10-CM

## 2019-07-18 DIAGNOSIS — Z8249 Family history of ischemic heart disease and other diseases of the circulatory system: Secondary | ICD-10-CM

## 2019-07-18 DIAGNOSIS — I350 Nonrheumatic aortic (valve) stenosis: Secondary | ICD-10-CM | POA: Diagnosis present

## 2019-07-18 DIAGNOSIS — M25461 Effusion, right knee: Secondary | ICD-10-CM | POA: Diagnosis present

## 2019-07-18 DIAGNOSIS — E785 Hyperlipidemia, unspecified: Secondary | ICD-10-CM | POA: Diagnosis present

## 2019-07-18 DIAGNOSIS — M1711 Unilateral primary osteoarthritis, right knee: Secondary | ICD-10-CM | POA: Diagnosis not present

## 2019-07-18 DIAGNOSIS — Z79899 Other long term (current) drug therapy: Secondary | ICD-10-CM

## 2019-07-18 DIAGNOSIS — E119 Type 2 diabetes mellitus without complications: Secondary | ICD-10-CM | POA: Diagnosis present

## 2019-07-18 DIAGNOSIS — G4733 Obstructive sleep apnea (adult) (pediatric): Secondary | ICD-10-CM | POA: Diagnosis present

## 2019-07-18 DIAGNOSIS — I10 Essential (primary) hypertension: Secondary | ICD-10-CM | POA: Diagnosis present

## 2019-07-18 DIAGNOSIS — M329 Systemic lupus erythematosus, unspecified: Secondary | ICD-10-CM | POA: Diagnosis present

## 2019-07-18 DIAGNOSIS — K219 Gastro-esophageal reflux disease without esophagitis: Secondary | ICD-10-CM | POA: Diagnosis present

## 2019-07-18 DIAGNOSIS — E559 Vitamin D deficiency, unspecified: Secondary | ICD-10-CM | POA: Diagnosis present

## 2019-07-18 DIAGNOSIS — R011 Cardiac murmur, unspecified: Secondary | ICD-10-CM | POA: Diagnosis present

## 2019-07-18 DIAGNOSIS — M170 Bilateral primary osteoarthritis of knee: Principal | ICD-10-CM | POA: Diagnosis present

## 2019-07-18 DIAGNOSIS — Z90710 Acquired absence of both cervix and uterus: Secondary | ICD-10-CM

## 2019-07-18 DIAGNOSIS — Z9884 Bariatric surgery status: Secondary | ICD-10-CM

## 2019-07-18 DIAGNOSIS — M5432 Sciatica, left side: Secondary | ICD-10-CM | POA: Diagnosis present

## 2019-07-18 DIAGNOSIS — M069 Rheumatoid arthritis, unspecified: Secondary | ICD-10-CM | POA: Diagnosis present

## 2019-07-18 DIAGNOSIS — Z79891 Long term (current) use of opiate analgesic: Secondary | ICD-10-CM

## 2019-07-18 DIAGNOSIS — Z83438 Family history of other disorder of lipoprotein metabolism and other lipidemia: Secondary | ICD-10-CM

## 2019-07-18 DIAGNOSIS — Z6841 Body Mass Index (BMI) 40.0 and over, adult: Secondary | ICD-10-CM

## 2019-07-18 DIAGNOSIS — Z886 Allergy status to analgesic agent status: Secondary | ICD-10-CM

## 2019-07-18 DIAGNOSIS — M25761 Osteophyte, right knee: Secondary | ICD-10-CM | POA: Diagnosis present

## 2019-07-18 DIAGNOSIS — Z833 Family history of diabetes mellitus: Secondary | ICD-10-CM

## 2019-07-18 DIAGNOSIS — Z7984 Long term (current) use of oral hypoglycemic drugs: Secondary | ICD-10-CM

## 2019-07-18 DIAGNOSIS — M810 Age-related osteoporosis without current pathological fracture: Secondary | ICD-10-CM | POA: Diagnosis present

## 2019-07-18 HISTORY — PX: TOTAL KNEE ARTHROPLASTY: SHX125

## 2019-07-18 LAB — GLUCOSE, CAPILLARY
Glucose-Capillary: 128 mg/dL — ABNORMAL HIGH (ref 70–99)
Glucose-Capillary: 127 mg/dL — ABNORMAL HIGH (ref 70–99)
Glucose-Capillary: 145 mg/dL — ABNORMAL HIGH (ref 70–99)
Glucose-Capillary: 147 mg/dL — ABNORMAL HIGH (ref 70–99)

## 2019-07-18 LAB — TYPE AND SCREEN
ABO/RH(D): O POS
Antibody Screen: NEGATIVE

## 2019-07-18 SURGERY — ARTHROPLASTY, KNEE, TOTAL
Anesthesia: Spinal | Site: Knee | Laterality: Right

## 2019-07-18 MED ORDER — DEXAMETHASONE SODIUM PHOSPHATE 10 MG/ML IJ SOLN
INTRAMUSCULAR | Status: DC | PRN
Start: 2019-07-18 — End: 2019-07-18
  Administered 2019-07-18: 4 mg via INTRAVENOUS

## 2019-07-18 MED ORDER — EPHEDRINE SULFATE-NACL 50-0.9 MG/10ML-% IV SOSY
PREFILLED_SYRINGE | INTRAVENOUS | Status: DC | PRN
  Administered 2019-07-18 (×4): 10 mg via INTRAVENOUS

## 2019-07-18 MED ORDER — ROSUVASTATIN CALCIUM 20 MG PO TABS
40.0000 mg | ORAL_TABLET | Freq: Every evening | ORAL | Status: DC
  Administered 2019-07-18 – 2019-07-19 (×2): 40 mg via ORAL
  Filled 2019-07-18 (×2): qty 2

## 2019-07-18 MED ORDER — EPHEDRINE 5 MG/ML INJ
INTRAVENOUS | Status: AC
  Filled 2019-07-18: qty 10

## 2019-07-18 MED ORDER — ONDANSETRON HCL 4 MG PO TABS: 4.0000 mg | ORAL_TABLET | Freq: Four times a day (QID) | ORAL | Status: DC | PRN

## 2019-07-18 MED ORDER — ONDANSETRON HCL 4 MG/2ML IJ SOLN: 4.0000 mg | Freq: Four times a day (QID) | INTRAMUSCULAR | Status: DC | PRN

## 2019-07-18 MED ORDER — BUPIVACAINE IN DEXTROSE 0.75-8.25 % IT SOLN
INTRATHECAL | Status: DC | PRN
  Administered 2019-07-18: 12 mg via INTRATHECAL

## 2019-07-18 MED ORDER — PANTOPRAZOLE SODIUM 40 MG PO TBEC
40.0000 mg | DELAYED_RELEASE_TABLET | Freq: Every day | ORAL | Status: DC
  Administered 2019-07-18 – 2019-07-20 (×3): 40 mg via ORAL
  Filled 2019-07-18 (×3): qty 1

## 2019-07-18 MED ORDER — PHENOL 1.4 % MT LIQD: 1.0000 | OROMUCOSAL | Status: DC | PRN

## 2019-07-18 MED ORDER — INSULIN ASPART 100 UNIT/ML ~~LOC~~ SOLN
0.0000 [IU] | Freq: Three times a day (TID) | SUBCUTANEOUS | Status: DC
  Administered 2019-07-19 (×2): 3 [IU] via SUBCUTANEOUS
  Administered 2019-07-19: 2 [IU] via SUBCUTANEOUS
  Administered 2019-07-20: 3 [IU] via SUBCUTANEOUS
  Administered 2019-07-20: 2 [IU] via SUBCUTANEOUS

## 2019-07-18 MED ORDER — ALUM & MAG HYDROXIDE-SIMETH 200-200-20 MG/5ML PO SUSP: 30.0000 mL | ORAL | Status: DC | PRN

## 2019-07-18 MED ORDER — LABETALOL HCL 100 MG PO TABS
400.0000 mg | ORAL_TABLET | Freq: Two times a day (BID) | ORAL | Status: DC
  Administered 2019-07-18 – 2019-07-20 (×4): 400 mg via ORAL
  Filled 2019-07-18 (×3): qty 4

## 2019-07-18 MED ORDER — TRANEXAMIC ACID-NACL 1000-0.7 MG/100ML-% IV SOLN
INTRAVENOUS | Status: AC
  Filled 2019-07-18: qty 100

## 2019-07-18 MED ORDER — ACETAMINOPHEN 10 MG/ML IV SOLN: 1000.0000 mg | Freq: Once | INTRAVENOUS | Status: DC | PRN

## 2019-07-18 MED ORDER — METOCLOPRAMIDE HCL 5 MG PO TABS: 5.0000 mg | ORAL_TABLET | Freq: Three times a day (TID) | ORAL | Status: DC | PRN

## 2019-07-18 MED ORDER — HYDROCHLOROTHIAZIDE 25 MG PO TABS
12.5000 mg | ORAL_TABLET | Freq: Every day | ORAL | Status: DC
  Administered 2019-07-18 – 2019-07-20 (×3): 12.5 mg via ORAL
  Filled 2019-07-18 (×3): qty 1

## 2019-07-18 MED ORDER — BUPIVACAINE-EPINEPHRINE 0.25% -1:200000 IJ SOLN
INTRAMUSCULAR | Status: DC | PRN
  Administered 2019-07-18: 30 mL

## 2019-07-18 MED ORDER — FENTANYL CITRATE (PF) 100 MCG/2ML IJ SOLN
INTRAMUSCULAR | Status: DC | PRN
  Administered 2019-07-18: 50 ug via INTRAVENOUS

## 2019-07-18 MED ORDER — ONDANSETRON HCL 4 MG/2ML IJ SOLN: 4.0000 mg | Freq: Once | INTRAMUSCULAR | Status: DC | PRN

## 2019-07-18 MED ORDER — PROPOFOL 10 MG/ML IV BOLUS
INTRAVENOUS | Status: DC | PRN
  Administered 2019-07-18: 20 mg via INTRAVENOUS

## 2019-07-18 MED ORDER — ASPIRIN 81 MG PO CHEW
81.0000 mg | CHEWABLE_TABLET | Freq: Two times a day (BID) | ORAL | Status: DC
  Administered 2019-07-18 – 2019-07-20 (×4): 81 mg via ORAL
  Filled 2019-07-18 (×4): qty 1

## 2019-07-18 MED ORDER — OXYCODONE HCL 5 MG PO TABS: 5.0000 mg | ORAL_TABLET | Freq: Once | ORAL | Status: DC | PRN

## 2019-07-18 MED ORDER — LACTATED RINGERS IV SOLN: INTRAVENOUS | Status: DC

## 2019-07-18 MED ORDER — AMISULPRIDE (ANTIEMETIC) 5 MG/2ML IV SOLN: 10.0000 mg | Freq: Once | INTRAVENOUS | Status: DC | PRN

## 2019-07-18 MED ORDER — ORAL CARE MOUTH RINSE: 15.0000 mL | Freq: Once | OROMUCOSAL | Status: AC

## 2019-07-18 MED ORDER — INSULIN ASPART 100 UNIT/ML ~~LOC~~ SOLN: 0.0000 [IU] | Freq: Every day | SUBCUTANEOUS | Status: DC

## 2019-07-18 MED ORDER — GABAPENTIN 100 MG PO CAPS
100.0000 mg | ORAL_CAPSULE | Freq: Three times a day (TID) | ORAL | Status: DC
  Administered 2019-07-18 – 2019-07-20 (×6): 100 mg via ORAL
  Filled 2019-07-18 (×6): qty 1

## 2019-07-18 MED ORDER — PROPOFOL 10 MG/ML IV BOLUS
INTRAVENOUS | Status: AC
  Filled 2019-07-18: qty 40

## 2019-07-18 MED ORDER — 0.9 % SODIUM CHLORIDE (POUR BTL) OPTIME
TOPICAL | Status: DC | PRN
  Administered 2019-07-18: 1000 mL

## 2019-07-18 MED ORDER — PHENYLEPHRINE 40 MCG/ML (10ML) SYRINGE FOR IV PUSH (FOR BLOOD PRESSURE SUPPORT)
PREFILLED_SYRINGE | INTRAVENOUS | Status: DC | PRN
  Administered 2019-07-18: 80 ug via INTRAVENOUS

## 2019-07-18 MED ORDER — HYDROMORPHONE HCL 1 MG/ML IJ SOLN: 0.5000 mg | INTRAMUSCULAR | Status: DC | PRN

## 2019-07-18 MED ORDER — AMLODIPINE BESYLATE 10 MG PO TABS
10.0000 mg | ORAL_TABLET | Freq: Every day | ORAL | Status: DC
  Administered 2019-07-19 – 2019-07-20 (×2): 10 mg via ORAL
  Filled 2019-07-18 (×2): qty 1

## 2019-07-18 MED ORDER — CHLORHEXIDINE GLUCONATE 0.12 % MT SOLN
15.0000 mL | Freq: Once | OROMUCOSAL | Status: AC
  Administered 2019-07-18: 15 mL via OROMUCOSAL

## 2019-07-18 MED ORDER — MIDAZOLAM HCL 2 MG/2ML IJ SOLN
INTRAMUSCULAR | Status: AC
  Filled 2019-07-18: qty 2

## 2019-07-18 MED ORDER — CEFAZOLIN SODIUM-DEXTROSE 1-4 GM/50ML-% IV SOLN
1.0000 g | Freq: Four times a day (QID) | INTRAVENOUS | Status: AC
  Administered 2019-07-18 (×2): 1 g via INTRAVENOUS
  Filled 2019-07-18 (×2): qty 50

## 2019-07-18 MED ORDER — ONDANSETRON HCL 4 MG/2ML IJ SOLN
INTRAMUSCULAR | Status: AC
  Filled 2019-07-18: qty 2

## 2019-07-18 MED ORDER — POVIDONE-IODINE 10 % EX SWAB
2.0000 "application " | Freq: Once | CUTANEOUS | Status: AC
  Administered 2019-07-18: 2 via TOPICAL

## 2019-07-18 MED ORDER — OXYCODONE HCL 5 MG/5ML PO SOLN: 5.0000 mg | Freq: Once | ORAL | Status: DC | PRN

## 2019-07-18 MED ORDER — DOCUSATE SODIUM 100 MG PO CAPS
100.0000 mg | ORAL_CAPSULE | Freq: Two times a day (BID) | ORAL | Status: DC
  Administered 2019-07-18 – 2019-07-20 (×4): 100 mg via ORAL
  Filled 2019-07-18 (×4): qty 1

## 2019-07-18 MED ORDER — FENTANYL CITRATE (PF) 100 MCG/2ML IJ SOLN: 50.0000 ug | INTRAMUSCULAR | Status: DC

## 2019-07-18 MED ORDER — LINAGLIPTIN 5 MG PO TABS
5.0000 mg | ORAL_TABLET | Freq: Every day | ORAL | Status: DC
  Administered 2019-07-18 – 2019-07-20 (×3): 5 mg via ORAL
  Filled 2019-07-18 (×3): qty 1

## 2019-07-18 MED ORDER — ACETAMINOPHEN 325 MG PO TABS
325.0000 mg | ORAL_TABLET | Freq: Four times a day (QID) | ORAL | Status: DC | PRN
  Administered 2019-07-19 – 2019-07-20 (×3): 650 mg via ORAL
  Filled 2019-07-18 (×3): qty 2

## 2019-07-18 MED ORDER — HYDROMORPHONE HCL 1 MG/ML IJ SOLN: 0.2500 mg | INTRAMUSCULAR | Status: DC | PRN

## 2019-07-18 MED ORDER — DIPHENHYDRAMINE HCL 12.5 MG/5ML PO ELIX: 12.5000 mg | ORAL_SOLUTION | ORAL | Status: DC | PRN

## 2019-07-18 MED ORDER — PROPOFOL 1000 MG/100ML IV EMUL
INTRAVENOUS | Status: AC
  Filled 2019-07-18: qty 100

## 2019-07-18 MED ORDER — METHOCARBAMOL 500 MG PO TABS
500.0000 mg | ORAL_TABLET | Freq: Four times a day (QID) | ORAL | Status: DC | PRN
  Administered 2019-07-18 – 2019-07-20 (×6): 500 mg via ORAL
  Filled 2019-07-18 (×6): qty 1

## 2019-07-18 MED ORDER — MENTHOL 3 MG MT LOZG: 1.0000 | LOZENGE | OROMUCOSAL | Status: DC | PRN

## 2019-07-18 MED ORDER — FENTANYL CITRATE (PF) 100 MCG/2ML IJ SOLN
INTRAMUSCULAR | Status: AC
  Filled 2019-07-18: qty 2

## 2019-07-18 MED ORDER — ONDANSETRON HCL 4 MG/2ML IJ SOLN
INTRAMUSCULAR | Status: DC | PRN
  Administered 2019-07-18: 4 mg via INTRAVENOUS

## 2019-07-18 MED ORDER — METFORMIN HCL 500 MG PO TABS
1000.0000 mg | ORAL_TABLET | Freq: Two times a day (BID) | ORAL | Status: DC | PRN
  Administered 2019-07-19 – 2019-07-20 (×2): 1000 mg via ORAL
  Filled 2019-07-18 (×2): qty 2

## 2019-07-18 MED ORDER — SODIUM CHLORIDE 0.9 % IV SOLN: INTRAVENOUS | Status: DC

## 2019-07-18 MED ORDER — SODIUM CHLORIDE 0.9 % IR SOLN
Status: DC | PRN
  Administered 2019-07-18: 1000 mL

## 2019-07-18 MED ORDER — PROPOFOL 500 MG/50ML IV EMUL
INTRAVENOUS | Status: DC | PRN
  Administered 2019-07-18: 75 ug/kg/min via INTRAVENOUS

## 2019-07-18 MED ORDER — BUPIVACAINE-EPINEPHRINE (PF) 0.25% -1:200000 IJ SOLN
INTRAMUSCULAR | Status: AC
  Filled 2019-07-18: qty 30

## 2019-07-18 MED ORDER — METHOCARBAMOL 500 MG IVPB - SIMPLE MED
500.0000 mg | Freq: Four times a day (QID) | INTRAVENOUS | Status: DC | PRN
  Filled 2019-07-18: qty 50

## 2019-07-18 MED ORDER — METOCLOPRAMIDE HCL 5 MG/ML IJ SOLN: 5.0000 mg | Freq: Three times a day (TID) | INTRAMUSCULAR | Status: DC | PRN

## 2019-07-18 MED ORDER — TRANEXAMIC ACID-NACL 1000-0.7 MG/100ML-% IV SOLN
1000.0000 mg | INTRAVENOUS | Status: AC
  Administered 2019-07-18: 1000 mg via INTRAVENOUS

## 2019-07-18 MED ORDER — FENTANYL CITRATE (PF) 100 MCG/2ML IJ SOLN
INTRAMUSCULAR | Status: AC
  Administered 2019-07-18: 50 ug via INTRAVENOUS
  Filled 2019-07-18: qty 2

## 2019-07-18 MED ORDER — MIDAZOLAM HCL 2 MG/2ML IJ SOLN: 1.0000 mg | INTRAMUSCULAR | Status: DC

## 2019-07-18 MED ORDER — OXYCODONE HCL 5 MG PO TABS
10.0000 mg | ORAL_TABLET | ORAL | Status: DC | PRN
  Administered 2019-07-18: 10 mg via ORAL
  Administered 2019-07-18: 15 mg via ORAL
  Administered 2019-07-19 – 2019-07-20 (×4): 10 mg via ORAL
  Filled 2019-07-18 (×2): qty 2
  Filled 2019-07-18: qty 3
  Filled 2019-07-18: qty 2

## 2019-07-18 MED ORDER — OXYCODONE HCL 5 MG PO TABS
5.0000 mg | ORAL_TABLET | ORAL | Status: DC | PRN
  Administered 2019-07-19 – 2019-07-20 (×3): 10 mg via ORAL
  Filled 2019-07-18 (×5): qty 2

## 2019-07-18 MED ORDER — DEXAMETHASONE SODIUM PHOSPHATE 10 MG/ML IJ SOLN
INTRAMUSCULAR | Status: AC
  Filled 2019-07-18: qty 1

## 2019-07-18 MED ORDER — POLYETHYLENE GLYCOL 3350 17 G PO PACK
17.0000 g | PACK | Freq: Every day | ORAL | Status: DC | PRN
  Administered 2019-07-20: 17 g via ORAL
  Filled 2019-07-18: qty 1

## 2019-07-18 SURGICAL SUPPLY — 59 items
APL SKNCLS STERI-STRIP NONHPOA (GAUZE/BANDAGES/DRESSINGS)
BAG SPEC THK2 15X12 ZIP CLS (MISCELLANEOUS)
BAG ZIPLOCK 12X15 (MISCELLANEOUS) IMPLANT
BASEPLATE TIBIAL TRIATHALON 3 (Plate) ×1 IMPLANT
BEARIN INSERT TIBIAL SZ 3 11 (Insert) ×2 IMPLANT
BEARING INSERT TIBIAL SZ 3 11 (Insert) IMPLANT
BENZOIN TINCTURE PRP APPL 2/3 (GAUZE/BANDAGES/DRESSINGS) IMPLANT
BLADE SAG 18X100X1.27 (BLADE) IMPLANT
BLADE SURG SZ10 CARB STEEL (BLADE) ×4 IMPLANT
BNDG ELASTIC 6X5.8 VLCR STR LF (GAUZE/BANDAGES/DRESSINGS) ×2 IMPLANT
BOWL SMART MIX CTS (DISPOSABLE) IMPLANT
BSPLAT TIB 3 CMNT PRM STRL KN (Plate) ×1 IMPLANT
CEMENT BONE SIMPLEX SPEEDSET (Cement) ×2 IMPLANT
COVER SURGICAL LIGHT HANDLE (MISCELLANEOUS) ×2 IMPLANT
COVER WAND RF STERILE (DRAPES) IMPLANT
CUFF TOURN SGL QUICK 34 (TOURNIQUET CUFF) ×2
CUFF TRNQT CYL 34X4.125X (TOURNIQUET CUFF) ×1 IMPLANT
DECANTER SPIKE VIAL GLASS SM (MISCELLANEOUS) IMPLANT
DRAPE U-SHAPE 47X51 STRL (DRAPES) ×2 IMPLANT
DRSG PAD ABDOMINAL 8X10 ST (GAUZE/BANDAGES/DRESSINGS) ×4 IMPLANT
DURAPREP 26ML APPLICATOR (WOUND CARE) ×2 IMPLANT
ELECT BLADE TIP CTD 4 INCH (ELECTRODE) ×2 IMPLANT
ELECT REM PT RETURN 15FT ADLT (MISCELLANEOUS) ×2 IMPLANT
FEMORAL PEG DISTAL FIXATION (Orthopedic Implant) ×1 IMPLANT
FEMORAL TRIATH POST STAB SZ 4 (Orthopedic Implant) ×1 IMPLANT
GAUZE SPONGE 4X4 12PLY STRL (GAUZE/BANDAGES/DRESSINGS) ×2 IMPLANT
GAUZE XEROFORM 1X8 LF (GAUZE/BANDAGES/DRESSINGS) ×1 IMPLANT
GLOVE BIO SURGEON STRL SZ7.5 (GLOVE) ×2 IMPLANT
GLOVE BIOGEL PI IND STRL 8 (GLOVE) ×2 IMPLANT
GLOVE BIOGEL PI INDICATOR 8 (GLOVE) ×2
GLOVE ECLIPSE 8.0 STRL XLNG CF (GLOVE) ×2 IMPLANT
GOWN STRL REUS W/TWL XL LVL3 (GOWN DISPOSABLE) ×4 IMPLANT
HANDPIECE INTERPULSE COAX TIP (DISPOSABLE) ×2
HOLDER FOLEY CATH W/STRAP (MISCELLANEOUS) IMPLANT
IMMOBILIZER KNEE 20 (SOFTGOODS) ×2
IMMOBILIZER KNEE 20 THIGH 36 (SOFTGOODS) ×1 IMPLANT
IMMOBILIZER KNEE 22 UNIV (SOFTGOODS) ×1 IMPLANT
KIT TURNOVER KIT A (KITS) IMPLANT
NS IRRIG 1000ML POUR BTL (IV SOLUTION) ×2 IMPLANT
PACK TOTAL KNEE CUSTOM (KITS) ×2 IMPLANT
PADDING CAST COTTON 6X4 STRL (CAST SUPPLIES) ×3 IMPLANT
PATELLA TRIATHALON (Knees) ×1 IMPLANT
PENCIL SMOKE EVACUATOR (MISCELLANEOUS) IMPLANT
PIN FLUTED HEDLESS FIX 3.5X1/8 (PIN) ×1 IMPLANT
PROTECTOR NERVE ULNAR (MISCELLANEOUS) ×2 IMPLANT
SET HNDPC FAN SPRY TIP SCT (DISPOSABLE) ×1 IMPLANT
SET PAD KNEE POSITIONER (MISCELLANEOUS) ×2 IMPLANT
STAPLER VISISTAT 35W (STAPLE) IMPLANT
STRIP CLOSURE SKIN 1/2X4 (GAUZE/BANDAGES/DRESSINGS) IMPLANT
SUT MNCRL AB 4-0 PS2 18 (SUTURE) IMPLANT
SUT VIC AB 0 CT1 27 (SUTURE) ×2
SUT VIC AB 0 CT1 27XBRD ANTBC (SUTURE) ×1 IMPLANT
SUT VIC AB 1 CT1 36 (SUTURE) ×4 IMPLANT
SUT VIC AB 2-0 CT1 27 (SUTURE) ×4
SUT VIC AB 2-0 CT1 TAPERPNT 27 (SUTURE) ×2 IMPLANT
TRAY FOLEY MTR SLVR 16FR STAT (SET/KITS/TRAYS/PACK) ×2 IMPLANT
WATER STERILE IRR 1000ML POUR (IV SOLUTION) ×2 IMPLANT
WRAP KNEE MAXI GEL POST OP (GAUZE/BANDAGES/DRESSINGS) ×2 IMPLANT
YANKAUER SUCT BULB TIP 10FT TU (MISCELLANEOUS) ×2 IMPLANT

## 2019-07-18 NOTE — Interval H&P Note (Signed)
History and Physical Interval Note: The patient understands that she is here today to have a right total knee arthroplasty to treat the pain from osteoarthritis of her right knee.  The risk and benefits of surgery been explained in detail and informed consent is obtained.  The right knee has been marked.  There has been no acute change in her medical status.  See recent H&P.  07/18/2019 10:58 AM  Virginia Flores  has presented today for surgery, with the diagnosis of osteoarthritis right knee.  The various methods of treatment have been discussed with the patient and family. After consideration of risks, benefits and other options for treatment, the patient has consented to  Procedure(s): RIGHT TOTAL KNEE ARTHROPLASTY (Right) as a surgical intervention.  The patient's history has been reviewed, patient examined, no change in status, stable for surgery.  I have reviewed the patient's chart and labs.  Questions were answered to the patient's satisfaction.     Mcarthur Rossetti

## 2019-07-18 NOTE — Brief Op Note (Signed)
07/18/2019  2:19 PM  PATIENT:  Vernell Leep  69 y.o. female  PRE-OPERATIVE DIAGNOSIS:  osteoarthritis right knee  POST-OPERATIVE DIAGNOSIS:  osteoarthritis right knee  PROCEDURE:  Procedure(s): RIGHT TOTAL KNEE ARTHROPLASTY (Right)  SURGEON:  Surgeon(s) and Role:    Mcarthur Rossetti, MD - Primary  PHYSICIAN ASSISTANT:  Benita Stabile, PA-C  ANESTHESIA:   local, regional and spinal  COUNTS:  YES  TOURNIQUET:   Total Tourniquet Time Documented: Thigh (Right) - 56 minutes Total: Thigh (Right) - 56 minutes   DICTATION: .Other Dictation: Dictation Number [id number]  PLAN OF CARE: Admit for overnight observation  PATIENT DISPOSITION:  PACU - hemodynamically stable.   Delay start of Pharmacological VTE agent (>24hrs) due to surgical blood loss or risk of bleeding: no

## 2019-07-18 NOTE — Anesthesia Postprocedure Evaluation (Signed)
Anesthesia Post Note  Patient: Virginia Flores  Procedure(s) Performed: RIGHT TOTAL KNEE ARTHROPLASTY (Right Knee)     Patient location during evaluation: Nursing Unit Anesthesia Type: Spinal Level of consciousness: oriented and awake and alert Pain management: pain level controlled Vital Signs Assessment: post-procedure vital signs reviewed and stable Respiratory status: spontaneous breathing and respiratory function stable Cardiovascular status: blood pressure returned to baseline and stable Postop Assessment: no headache, no backache, no apparent nausea or vomiting and patient able to bend at knees Anesthetic complications: no   No complications documented.  Last Vitals:  Vitals:   07/18/19 1555 07/18/19 1653  BP: (!) 175/96 (!) 157/72  Pulse: 63 68  Resp: 16 19  Temp: 36.4 C   SpO2: 98% 97%    Last Pain:  Vitals:   07/18/19 1712  TempSrc:   PainSc: 8                  Barnet Glasgow

## 2019-07-18 NOTE — Progress Notes (Signed)
Orthopedic Tech Progress Note Patient Details:  Virginia Flores [birth year] [id number]  CPM Right Knee CPM Right Knee: Off Right Knee Flexion (Degrees): 90 Right Knee Extension (Degrees): 0  Post Interventions Patient Tolerated: Well Instructions Provided: Care of device  [name] 07/18/2019, 6:27 PM

## 2019-07-18 NOTE — Transfer of Care (Signed)
Immediate Anesthesia Transfer of Care Note  Patient: Virginia Flores  Procedure(s) Performed: RIGHT TOTAL KNEE ARTHROPLASTY (Right Knee)  Patient Location: PACU  Anesthesia Type:Regional and Spinal  Level of Consciousness: sedated  Airway & Oxygen Therapy: Patient Spontanous Breathing and Patient connected to face mask oxygen  Post-op Assessment: Report given to RN and Post -op Vital signs reviewed and stable  Post vital signs: Reviewed and stable  Last Vitals:  Vitals Value Taken Time  BP    Temp    Pulse 71 07/18/19 1450  Resp 13 07/18/19 1450  SpO2 99 % 07/18/19 1450  Vitals shown include unvalidated device data.  Last Pain:  Vitals:   07/18/19 1216  TempSrc:   PainSc: 0-No pain      Patients Stated Pain Goal: 4 (99/23/41 4436)  Complications: No complications documented.

## 2019-07-18 NOTE — Anesthesia Procedure Notes (Signed)
Spinal  Patient location during procedure: OR Start time: 07/18/2019 12:40 PM End time: 07/18/2019 12:47 PM Staffing Anesthesiologist: Barnet Glasgow, MD Preanesthetic Checklist Completed: patient identified, IV checked, site marked, risks and benefits discussed, surgical consent, monitors and equipment checked, pre-op evaluation and timeout performed Spinal Block Patient position: sitting Prep: DuraPrep Patient monitoring: heart rate, cardiac monitor, continuous pulse ox and blood pressure Approach: midline Location: L3-4 Injection technique: single-shot Needle Needle type: Sprotte  Needle gauge: 24 G Needle length: 9 cm Needle insertion depth: 7 cm Assessment Sensory level: T4

## 2019-07-18 NOTE — Progress Notes (Signed)
AssistedDr. Houser with right, ultrasound guided, adductor canal block. Side rails up, monitors on throughout procedure. See vital signs in flow sheet. Tolerated Procedure well.  

## 2019-07-18 NOTE — Anesthesia Procedure Notes (Addendum)
Anesthesia Regional Block: Adductor canal block   Pre-Anesthetic Checklist: ,, timeout performed, Correct Patient, Correct Site, Correct Laterality, Correct Procedure, Correct Position, site marked, Risks and benefits discussed,  Surgical consent,  Pre-op evaluation,  At surgeon's request and post-op pain management  Laterality: Lower and Right  Prep: chloraprep       Needles:  Injection technique: Single-shot  Needle Type: Echogenic Needle     Needle Length: 9cm  Needle Gauge: 22     Additional Needles:   Procedures:,,,, ultrasound used (permanent image in chart),,,,  Narrative:  Start time: 07/18/2019 11:25 AM End time: 07/18/2019 11:32 AM Injection made incrementally with aspirations every 5 mL.  Performed by: Personally  Anesthesiologist: Barnet Glasgow, MD  Additional Notes: Block assessed prior to surgery. Pt tolerated procedure well.

## 2019-07-18 NOTE — Evaluation (Signed)
Physical Therapy Evaluation Patient Details Name: Virginia Flores MRN: [id number] DOB: 06/06/1950 Today's Date: 07/18/2019   History of Present Illness  Patient is 69 y.o. female s/p Rt TKA on 07/18/19 with PMH significant for lupus, osteoporosis, HTN, HLD, OA, CAD, DM, obesity.    Clinical Impression  Virginia Flores is a 69 y.o. female POD 0 s/p Rt TKA. Patient reports modified independence with rollator for mobility at baseline. Patient is now limited by functional impairments (see PT problem list below) and requires min assist for transfers and gait with RW. Patient was able to ambulate ~60 feet with RW and min assist. Patient instructed in exercise to facilitate ROM and circulation. Patient will benefit from continued skilled PT interventions to address impairments and progress towards PLOF. Acute PT will follow to progress mobility and stair training in preparation for safe discharge home.     Follow Up Recommendations Follow surgeon's recommendation for DC plan and follow-up therapies;Home health PT    Equipment Recommendations  None recommended by PT    Recommendations for Other Services       Precautions / Restrictions Precautions Precautions: Fall Restrictions Weight Bearing Restrictions: No Other Position/Activity Restrictions: WBAT      Mobility  Bed Mobility Overal bed mobility: Needs Assistance Bed Mobility: Supine to Sit     Supine to sit: Min assist;HOB elevated     General bed mobility comments: cues to use of bed rail to raise trunk up, assist required to bring LE's off EOB and lift trunk.   Transfers Overall transfer level: Needs assistance Equipment used: Rolling walker (2 wheeled) Transfers: Sit to/from Stand Sit to Stand: Min assist;From elevated surface         General transfer comment: cues for safe technique with RW to rise from EOB, min assist from elevated EOB. no buckling noted on Rt knee in standing.   Ambulation/Gait Ambulation/Gait  assistance: Min assist Gait Distance (Feet): 50 Feet Assistive device: Rolling walker (2 wheeled) Gait Pattern/deviations: Step-to pattern;Decreased step length - left;Decreased stance time - right;Decreased weight shift to right Gait velocity: decr   General Gait Details: cues for safe step pattern and proximity to RW, no overt LOB noted. min assist to manage walker position.   Stairs         Wheelchair Mobility    Modified Rankin (Stroke Patients Only)       Balance Overall balance assessment: Needs assistance Sitting-balance support: Feet supported Sitting balance-Leahy Scale: Good     Standing balance support: During functional activity;Bilateral upper extremity supported Standing balance-Leahy Scale: Poor                Pertinent Vitals/Pain Pain Assessment: 0-10 Pain Score: 7  Pain Location: Rt knee Pain Descriptors / Indicators: Aching;Discomfort;Sore Pain Intervention(s): Limited activity within patient's tolerance;Monitored during session;Repositioned    Home Living Family/patient expects to be discharged to:: Private residence Living Arrangements: Spouse/significant other Available Help at Discharge: Family Type of Home: House Home Access: Stairs to enter Entrance Stairs-Rails: None Entrance Stairs-Number of Steps: 1 Home Layout: One level Home Equipment: Cane - single point;Walker - 4 wheels;Walker - 2 wheels      Prior Function Level of Independence: Independent with assistive device(s)         Comments: pt has been using rollator for ~3-4 years     Hand Dominance   Dominant Hand: Right    Extremity/Trunk Assessment   Upper Extremity Assessment Upper Extremity Assessment: Overall WFL for tasks assessed  Lower Extremity Assessment Lower Extremity Assessment: Overall WFL for tasks assessed;RLE deficits/detail RLE Deficits / Details: good quad activation, no extensor lag with SLR RLE Sensation: WNL RLE Coordination: WNL     Cervical / Trunk Assessment Cervical / Trunk Assessment: Normal  Communication   Communication: No difficulties  Cognition Arousal/Alertness: Awake/alert Behavior During Therapy: WFL for tasks assessed/performed Overall Cognitive Status: Within Functional Limits for tasks assessed                   General Comments      Exercises Total Joint Exercises Ankle Circles/Pumps: AROM;10 reps;Both;Seated Quad Sets: AROM;Right;10 reps;Seated Heel Slides: AAROM;Right;5 reps;Seated   Assessment/Plan    PT Assessment Patient needs continued PT services  PT Problem List Decreased strength;Decreased range of motion;Decreased activity tolerance;Decreased balance;Decreased mobility;Decreased knowledge of use of DME;Pain       PT Treatment Interventions DME instruction;Gait training;Stair training;Functional mobility training;Therapeutic activities;Therapeutic exercise;Balance training;Patient/family education    PT Goals (Current goals can be found in the Care Plan section)  Acute Rehab PT Goals Patient Stated Goal: get both knees fixed PT Goal Formulation: With patient Time For Goal Achievement: 07/25/19 Potential to Achieve Goals: Good    Frequency 7X/week   Barriers to discharge        AM-PAC PT "6 Clicks" Mobility  Outcome Measure Help needed turning from your back to your side while in a flat bed without using bedrails?: A Little Help needed moving from lying on your back to sitting on the side of a flat bed without using bedrails?: A Little Help needed moving to and from a bed to a chair (including a wheelchair)?: A Little Help needed standing up from a chair using your arms (e.g., wheelchair or bedside chair)?: A Little Help needed to walk in hospital room?: A Little Help needed climbing 3-5 steps with a railing? : A Little 6 Click Score: 18    End of Session Equipment Utilized During Treatment: Gait belt Activity Tolerance: Patient tolerated treatment  well Patient left: in chair;with call bell/phone within reach;with chair alarm set;with family/visitor present Nurse Communication: Mobility status PT Visit Diagnosis: Muscle weakness (generalized) (M62.81);Difficulty in walking, not elsewhere classified (R26.2)    Time: 1610-9604 PT Time Calculation (min) (ACUTE ONLY): 38 min   Charges:   PT Evaluation $PT Eval Low Complexity: 1 Low PT Treatments $Gait Training: 8-22 mins $Therapeutic Exercise: 8-22 mins       Verner Mould, DPT Acute Rehabilitation Services  Office [phone] Pager [phone]  07/18/2019 7:02 PM

## 2019-07-18 NOTE — Progress Notes (Signed)
Orthopedic Tech Progress Note Patient Details:  Virginia Flores April 22, 1950 [id number]  CPM Right Knee CPM Right Knee: On Right Knee Flexion (Degrees): 90 Right Knee Extension (Degrees): 0  Post Interventions Patient Tolerated: Well Instructions Provided: Care of device  [name] 07/18/2019, 3:22 PM

## 2019-07-18 NOTE — Anesthesia Procedure Notes (Signed)
Procedure Name: MAC Date/Time: 07/18/2019 12:37 PM Performed by: West Pugh, CRNA Pre-anesthesia Checklist: Patient identified, Emergency Drugs available, Suction available, Patient being monitored and Timeout performed Patient Re-evaluated:Patient Re-evaluated prior to induction Oxygen Delivery Method: Simple face mask and Nasal cannula Preoxygenation: Pre-oxygenation with 100% oxygen Induction Type: IV induction Number of attempts: 1 Placement Confirmation: positive ETCO2 Dental Injury: Teeth and Oropharynx as per pre-operative assessment

## 2019-07-19 DIAGNOSIS — M329 Systemic lupus erythematosus, unspecified: Secondary | ICD-10-CM | POA: Diagnosis present

## 2019-07-19 DIAGNOSIS — K219 Gastro-esophageal reflux disease without esophagitis: Secondary | ICD-10-CM | POA: Diagnosis present

## 2019-07-19 DIAGNOSIS — Z79899 Other long term (current) drug therapy: Secondary | ICD-10-CM | POA: Diagnosis not present

## 2019-07-19 DIAGNOSIS — M25461 Effusion, right knee: Secondary | ICD-10-CM | POA: Diagnosis present

## 2019-07-19 DIAGNOSIS — I10 Essential (primary) hypertension: Secondary | ICD-10-CM | POA: Diagnosis present

## 2019-07-19 DIAGNOSIS — Z7984 Long term (current) use of oral hypoglycemic drugs: Secondary | ICD-10-CM | POA: Diagnosis not present

## 2019-07-19 DIAGNOSIS — M5432 Sciatica, left side: Secondary | ICD-10-CM | POA: Diagnosis present

## 2019-07-19 DIAGNOSIS — I350 Nonrheumatic aortic (valve) stenosis: Secondary | ICD-10-CM | POA: Diagnosis present

## 2019-07-19 DIAGNOSIS — M25761 Osteophyte, right knee: Secondary | ICD-10-CM | POA: Diagnosis present

## 2019-07-19 DIAGNOSIS — Z886 Allergy status to analgesic agent status: Secondary | ICD-10-CM | POA: Diagnosis not present

## 2019-07-19 DIAGNOSIS — E119 Type 2 diabetes mellitus without complications: Secondary | ICD-10-CM | POA: Diagnosis present

## 2019-07-19 DIAGNOSIS — E559 Vitamin D deficiency, unspecified: Secondary | ICD-10-CM | POA: Diagnosis present

## 2019-07-19 DIAGNOSIS — G4733 Obstructive sleep apnea (adult) (pediatric): Secondary | ICD-10-CM | POA: Diagnosis present

## 2019-07-19 DIAGNOSIS — M170 Bilateral primary osteoarthritis of knee: Secondary | ICD-10-CM | POA: Diagnosis present

## 2019-07-19 DIAGNOSIS — Z90710 Acquired absence of both cervix and uterus: Secondary | ICD-10-CM | POA: Diagnosis not present

## 2019-07-19 DIAGNOSIS — Z9884 Bariatric surgery status: Secondary | ICD-10-CM | POA: Diagnosis not present

## 2019-07-19 DIAGNOSIS — Z6841 Body Mass Index (BMI) 40.0 and over, adult: Secondary | ICD-10-CM | POA: Diagnosis not present

## 2019-07-19 DIAGNOSIS — R011 Cardiac murmur, unspecified: Secondary | ICD-10-CM | POA: Diagnosis present

## 2019-07-19 DIAGNOSIS — E785 Hyperlipidemia, unspecified: Secondary | ICD-10-CM | POA: Diagnosis present

## 2019-07-19 DIAGNOSIS — Z8249 Family history of ischemic heart disease and other diseases of the circulatory system: Secondary | ICD-10-CM | POA: Diagnosis not present

## 2019-07-19 DIAGNOSIS — M069 Rheumatoid arthritis, unspecified: Secondary | ICD-10-CM | POA: Diagnosis present

## 2019-07-19 DIAGNOSIS — M810 Age-related osteoporosis without current pathological fracture: Secondary | ICD-10-CM | POA: Diagnosis present

## 2019-07-19 DIAGNOSIS — M25561 Pain in right knee: Secondary | ICD-10-CM | POA: Diagnosis present

## 2019-07-19 DIAGNOSIS — Z79891 Long term (current) use of opiate analgesic: Secondary | ICD-10-CM | POA: Diagnosis not present

## 2019-07-19 LAB — BASIC METABOLIC PANEL
Anion gap: 8 (ref 5–15)
BUN: 26 mg/dL — ABNORMAL HIGH (ref 8–23)
CO2: 28 mmol/L (ref 22–32)
Calcium: 8.5 mg/dL — ABNORMAL LOW (ref 8.9–10.3)
Chloride: 102 mmol/L (ref 98–111)
Creatinine, Ser: 0.81 mg/dL (ref 0.44–1.00)
GFR calc Af Amer: >60 mL/min (ref >60)
GFR calc non Af Amer: >60 mL/min (ref >60)
Glucose, Bld: 157 mg/dL — ABNORMAL HIGH (ref 70–99)
Potassium: 4.1 mmol/L (ref 3.5–5.1)
Sodium: 138 mmol/L (ref 135–145)

## 2019-07-19 LAB — GLUCOSE, CAPILLARY
Glucose-Capillary: 128 mg/dL — ABNORMAL HIGH (ref 70–99)
Glucose-Capillary: 179 mg/dL — ABNORMAL HIGH (ref 70–99)
Glucose-Capillary: 172 mg/dL — ABNORMAL HIGH (ref 70–99)
Glucose-Capillary: 160 mg/dL — ABNORMAL HIGH (ref 70–99)

## 2019-07-19 LAB — CBC
HCT: 34.7 % — ABNORMAL LOW (ref 36.0–46.0)
Hemoglobin: 10.7 g/dL — ABNORMAL LOW (ref 12.0–15.0)
MCH: 22.9 pg — ABNORMAL LOW (ref 26.0–34.0)
MCHC: 30.8 g/dL (ref 30.0–36.0)
MCV: 74.1 fL — ABNORMAL LOW (ref 80.0–100.0)
Platelets: 183 10*3/uL (ref 150–400)
RBC: 4.68 MIL/uL (ref 3.87–5.11)
RDW: 17.3 % — ABNORMAL HIGH (ref 11.5–15.5)
WBC: 9 10*3/uL (ref 4.0–10.5)
nRBC: 0 % (ref 0.0–0.2)

## 2019-07-19 MED ORDER — METHOCARBAMOL 500 MG PO TABS: 500.0000 mg | ORAL_TABLET | Freq: Four times a day (QID) | ORAL | 1 refills | Status: DC | PRN

## 2019-07-19 MED ORDER — ASPIRIN 81 MG PO CHEW: 81.0000 mg | CHEWABLE_TABLET | Freq: Two times a day (BID) | ORAL | 0 refills | Status: DC

## 2019-07-19 MED ORDER — OXYCODONE HCL 5 MG PO TABS: 5.0000 mg | ORAL_TABLET | ORAL | 0 refills | Status: DC | PRN

## 2019-07-19 NOTE — Discharge Instructions (Signed)

## 2019-07-19 NOTE — Op Note (Signed)
NAME: DEMRI, POULTON MEDICAL RECORD QP:[id number] ACCOUNT [id number] DATE OF BIRTH:Jul 14, 1950 FACILITY: [facility] LOCATION: [id number] PHYSICIAN:Angelynn Lemus Kerry Fort, MD  OPERATIVE REPORT  DATE OF PROCEDURE:  07/18/2019  PREOPERATIVE DIAGNOSIS:  Primary osteoarthritis and degenerative joint disease, right knee.  POSTOPERATIVE DIAGNOSIS:  Primary osteoarthritis and degenerative joint disease, right knee.  PROCEDURE:  Right total knee arthroplasty.  IMPLANTS:  Stryker Triathlon cemented C and S cemented knee system with size 4 right femur, size 3 tibial tray, 11 mm fixed bearing polyethylene insert, size 29 patellar button.  SURGEON:  Jean Rosenthal, MD  ASSISTANT:  Erskine Emery, PA-C.  ANESTHESIA: 1.  Right lower extremity adductor canal block. 2.  Spinal. 3.  Local with 0.25% Marcaine mixed with epinephrine around the arthrotomy.  ANTIBIOTICS:  Two grams IV Ancef.  TOURNIQUET TIME:  Less than 1 hour.  ESTIMATED BLOOD LOSS:  Less  than 100 mL.  COMPLICATIONS:  None.  INDICATIONS:  The patient is a 69 year old female well known to me.  She has debilitating arthritis involving her right knee.  This has been well documented with x-rays and clinical exam.  She has tried and failed all forms of conservative treatment.   She is someone who is morbidly obese with a BMI of over 40, but her knee is not a big knee at all.  At this point, I do feel that knee replacement surgery will help improve her mobility and decrease her pain and she can continue her weight loss journey.   She has been losing a significant amount of weight.  She has had bariatric surgery as well.  DESCRIPTION OF PROCEDURE:  After informed consent was obtained and appropriate right knee was marked.  Anesthesia obtained a right knee block in the holding room in the right lower extremity adductor canal block in the holding room.  She was then brought  to the operating room and sat up on the operating table where  spinal anesthesia was then obtained.  She was then laid in supine position on the operating table.  Foley catheter was placed and then her right thigh, knee, leg, ankle, foot were prepped and  draped with DuraPrep and sterile drapes including a sterile stockinette.  Time-out was called to identify correct patient, correct right knee.  We then used an Esmarch to wrap that leg and tourniquet was inflated to 300 mm of pressure.  I then made a  direct midline incision over the patella and carried this proximally and distally.  I dissected down the knee joint carried out.  A medial parapatellar arthrotomy finding a moderate joint effusion and significant periarticular osteophytes throughout the  knee, we removed these with a rongeur and with the knee in a flexed position, we removed the ACL, PCL, medial and lateral meniscus.  We used extramedullary guide and cutting guide for making our tibia cut choosing a zero slope correcting for varus and  valgus and we made our cut to take 9 mm off the high side.  We made this cut without difficulty.  We then went to the femur and made our intramedullary drill placement of the femoral notch for placing our intramedullary distal femoral cutting guide  setting this for a 10 mm distal femoral cut based on the slight flexion contracture for a right knee at 5 degrees externally rotated.  We made that cut without difficulty.  We brought the knee back down to full extension and achieved full extension with  a 9 and then 10 mm extension block.  We  then went back to the femur and put our femoral sizing guide based off Whiteside's line and epicondylar axis.  Based off this, we chose a size 4 femur, we put a 4-in-1 cutting block for a size 4 femur, made our  anterior and posterior cuts, followed by our chamfer cuts.  We then made our femoral box cut.  Attention was then turned to the tibia.  We set our tibial rotation off the tibial tubercle and the femur using a size 3 tibial tray for  coverage of the  plateau.  We made our keel punch off of this  with a size 3 tibial trial followed by the 4 right femoral trial we tried a 9 and then 11 mm fixed bearing polyethylene insert.  We were pleased with stability and range of motion with the 11 mm insert.  We  then removed all trial instrumentation from the knee.  We made our patellar cut and drilled 3 holes for size 29 central patellar button.  We then irrigated the knee with normal saline solution using pulse lavage.  We dried the knee real well and then  inserted our Marcaine and epinephrine mixture around the arthrotomy.  We then dried the knee real well real well and mixed our cement.  With the knee in a flexed position, we cemented our Stryker Triathlon tibial tray size 3 followed by real size 4 right  femur.  We let the cement harden and placed our 11 mm fixed bearing polyethylene insert and also cemented our patellar button.  Once the cement hardened completely.  We let the tourniquet down.  Hemostasis obtained with electrocautery.  We put her knee  through several cycles of motion.  We were pleased with stability.  We then closed the arthrotomy with interrupted #1 Vicryl suture followed by 0 Vicryl in the deep tissue, 2-0 Vicryl to close the subcutaneous tissue.  The skin was closed with staples.   Xeroform well-padded sterile dressing was applied.  She was taken to recovery room in stable condition.  All final counts were correct.  There were no complications noted.  Of note, Benita Stabile, PA-C did assist during the entire case and assistance was  crucial for facilitating all aspects of this case.  PN/NUANCE  D:07/18/2019 T:07/19/2019 JOB:[id number]

## 2019-07-19 NOTE — Progress Notes (Signed)
Orthopedic Tech Progress Note Patient Details:  Virginia Flores Apr 18, 1950 [id number]  CPM Right Knee CPM Right Knee: On Right Knee Flexion (Degrees): 90 Right Knee Extension (Degrees): 0  Post Interventions Patient Tolerated: Well Instructions Provided: Care of device  [name] 07/19/2019, 10:45 AM

## 2019-07-19 NOTE — Progress Notes (Addendum)
Physical Therapy Treatment Patient Details Name: Virginia Flores MRN: [id number] DOB: [birth year] Today's Date: 07/19/2019    History of Present Illness Patient is 69 y.o. female s/p Rt TKA on 07/18/19 with PMH significant for lupus, osteoporosis, HTN, HLD, OA, CAD, DM, obesity.    PT Comments    Pt is POD #1 for R TKA seen for second PT session today.  She c/o increased pain and soreness this session - limited exercise and gait distance due to pain.  She required frequent cues for exercise, gait, and transfer technique.  Pt had difficulty with sit to stand requiring mod A and increased time and with unsteadiness.  Pt fatigued easily with gait and was unable to attempt stairs.  From therapy stand point would benefit from further therapy prior to returning home safely.  Cont POC but may need to consider SNF if not improved  Follow Up Recommendations  Follow surgeon's recommendation for DC plan and follow-up therapies;Home health PT;Supervision/Assistance - 24 hour     Equipment Recommendations  Rolling walker with 5" wheels;3in1 (PT)    Recommendations for Other Services       Precautions / Restrictions Precautions Precautions: Fall Restrictions Other Position/Activity Restrictions: WBAT    Mobility  Bed Mobility Overal bed mobility: Needs Assistance Bed Mobility: Supine to Sit;Sit to Supine     Supine to sit: Min assist;HOB elevated Sit to supine: Mod assist   General bed mobility comments: cues to use of bed rail to raise trunk up, assist required to bring LE's off EOB and lift trunk.   Transfers Overall transfer level: Needs assistance Equipment used: Rolling walker (2 wheeled) Transfers: Sit to/from Stand Sit to Stand: Mod assist         General transfer comment: Performed x 2; required repeated cues for safe hand placement and getting feet inside of RW; required increased time and effort; required assist with toielting ADLs  Ambulation/Gait Ambulation/Gait  assistance: Min guard Gait Distance (Feet): 20 Feet (x2) Assistive device: Rolling walker (2 wheeled) Gait Pattern/deviations: Step-to pattern;Decreased step length - left;Decreased stance time - right;Decreased weight shift to right;Trunk flexed Gait velocity: decr   General Gait Details: Cues for RW proximity and use; frequent cues for posture.  Pt tending to want to lean on forearms - required cues to stand upright and use hands on RW.   Stairs             Wheelchair Mobility    Modified Rankin (Stroke Patients Only)       Balance Overall balance assessment: Needs assistance Sitting-balance support: Feet supported Sitting balance-Leahy Scale: Good     Standing balance support: During functional activity;Bilateral upper extremity supported Standing balance-Leahy Scale: Poor Standing balance comment: Unable to perform toielting ADLs due to required use of RW and leaned on counter when washing hands                            Cognition Arousal/Alertness: Awake/alert Behavior During Therapy: WFL for tasks assessed/performed Overall Cognitive Status: Within Functional Limits for tasks assessed                                        Exercises Total Joint Exercises Ankle Circles/Pumps: AROM;10 reps;Both;Seated Quad Sets: AROM;10 reps;Supine;Both Towel Squeeze: AROM;Both;10 reps;Supine Heel Slides: AAROM;Right;10 reps;Supine Hip ABduction/ADduction: AAROM;Right;10 reps;Supine Knee Flexion: AAROM;Right;10 reps;Seated Goniometric ROM: R  knee 0 to 60 degrees; limited by pain    General Comments General comments (skin integrity, edema, etc.): VSS      Pertinent Vitals/Pain Pain Assessment: 0-10 Pain Score: 10-Worst pain ever Pain Location: Rt knee Pain Descriptors / Indicators: Aching;Discomfort;Sore;Grimacing Pain Intervention(s): Limited activity within patient's tolerance;Ice applied;Repositioned;Monitored during session    Home  Living                      Prior Function            PT Goals (current goals can now be found in the care plan section) Acute Rehab PT Goals Patient Stated Goal: get both knees fixed PT Goal Formulation: With patient Time For Goal Achievement: 07/25/19 Potential to Achieve Goals: Good Progress towards PT goals: Progressing toward goals    Frequency    7X/week      PT Plan Current plan remains appropriate    Co-evaluation              AM-PAC PT "6 Clicks" Mobility   Outcome Measure  Help needed turning from your back to your side while in a flat bed without using bedrails?: A Little Help needed moving from lying on your back to sitting on the side of a flat bed without using bedrails?: A Lot Help needed moving to and from a bed to a chair (including a wheelchair)?: A Little Help needed standing up from a chair using your arms (e.g., wheelchair or bedside chair)?: A Lot Help needed to walk in hospital room?: A Little Help needed climbing 3-5 steps with a railing? : A Lot 6 Click Score: 15    End of Session Equipment Utilized During Treatment: Gait belt Activity Tolerance: Patient tolerated treatment well Patient left: with call bell/phone within reach;in bed;with bed alarm set;with family/visitor present Nurse Communication: Mobility status PT Visit Diagnosis: Muscle weakness (generalized) (M62.81);Difficulty in walking, not elsewhere classified (R26.2)     Time: 9758-8325 PT Time Calculation (min) (ACUTE ONLY): 34 min  Charges:  $Gait Training: 8-22 mins $Therapeutic Exercise: 8-22 mins                     Abran Richard, PT Acute Rehab Services Pager [phone] [facility] [place] 07/19/2019, 3:07 PM

## 2019-07-19 NOTE — Progress Notes (Signed)
Subjective: 1 Day Post-Op Procedure(s) (LRB): RIGHT TOTAL KNEE ARTHROPLASTY (Right) Patient reports pain as moderate.  Working slowly with therapy.  Objective: Vital signs in last 24 hours: Temp:  [97.6 F (36.4 C)-99.1 F (37.3 C)] 99.1 F (37.3 C) (07/17 0528) Pulse Rate:  [60-85] 85 (07/17 0928) Resp:  [12-21] 16 (07/17 0528) BP: (135-199)/(62-103) 138/62 (07/17 0928) SpO2:  [94 %-100 %] 94 % (07/17 0528) Weight:  [121.4 kg] 121.4 kg (07/16 1000)  Intake/Output from previous day: 07/16 0701 - 07/17 0700 In: 3962.2 [P.O.:1650; I.V.:2112.2; IV Piggyback:200] Out: 700 [Urine:700] Intake/Output this shift: Total I/O In: 240 [P.O.:240] Out: -   Recent Labs    07/19/19 0324  HGB 10.7*   Recent Labs    07/19/19 0324  WBC 9.0  RBC 4.68  HCT 34.7*  PLT 183   Recent Labs    07/19/19 0324  NA 138  K 4.1  CL 102  CO2 28  BUN 26*  CREATININE 0.81  GLUCOSE 157*  CALCIUM 8.5*   No results for input(s): LABPT, INR in the last 72 hours.  Sensation intact distally Intact pulses distally Dorsiflexion/Plantar flexion intact Incision: dressing C/D/I No cellulitis present Compartment soft   Assessment/Plan: 1 Day Post-Op Procedure(s) (LRB): RIGHT TOTAL KNEE ARTHROPLASTY (Right) Up with therapy Plan for discharge tomorrow Discharge home with home health  Need to make inpatient admission.  Not safe from a mobility standpoint to discharge today.  Needs maximum benefit from therapy and pain control.    Mcarthur Rossetti 07/19/2019, 9:46 AM

## 2019-07-19 NOTE — Plan of Care (Signed)

## 2019-07-19 NOTE — Progress Notes (Signed)
Physical Therapy Treatment Patient Details Name: Virginia Flores MRN: [id number] DOB: [birth year] Today's Date: 07/19/2019    History of Present Illness Patient is 69 y.o. female s/p Rt TKA on 07/18/19 with PMH significant for lupus, osteoporosis, HTN, HLD, OA, CAD, DM, obesity.    PT Comments    Pt is POD #1 for R TKA.  She required frequent cues for exercise, gait, and transfer technique.  Pt had difficulty with sit to stand requiring mod A and increased time and with unsteadiness.  Pt fatigued easily with gait and was unable to attempt stairs.  From therapy stand point would benefit from further therapy prior to returning home safely.  She did demonstrate fair ROM, fair pain control, and good quad activation.  Cont POC.    Follow Up Recommendations  Follow surgeon's recommendation for DC plan and follow-up therapies;Home health PT;Supervision/Assistance - 24 hour     Equipment Recommendations  Rolling walker with 5" wheels;3in1 (PT)    Recommendations for Other Services       Precautions / Restrictions Precautions Precautions: Fall Restrictions Other Position/Activity Restrictions: WBAT    Mobility  Bed Mobility Overal bed mobility: Needs Assistance Bed Mobility: Supine to Sit     Supine to sit: Min assist;HOB elevated     General bed mobility comments: cues to use of bed rail to raise trunk up, assist required to bring LE's off EOB and lift trunk.   Transfers Overall transfer level: Needs assistance Equipment used: Rolling walker (2 wheeled) Transfers: Sit to/from Stand Sit to Stand: Mod assist         General transfer comment: Performed x 2; required repeated cues for safe hand placement and getting feet inside of RW; required increased time and effort; required assist with toielting ADLs  Ambulation/Gait Ambulation/Gait assistance: Min guard Gait Distance (Feet): 50 Feet (20' then 50') Assistive device: Rolling walker (2 wheeled) Gait Pattern/deviations:  Step-to pattern;Decreased step length - left;Decreased stance time - right;Decreased weight shift to right;Trunk flexed Gait velocity: decr   General Gait Details: Cues for RW proximity and use; frequent cues for posture.  Pt tending to want to lean on forearms - required cues to stand upright and use hands on RW.   Stairs Stairs:  (Pt did not want to attempt this morning)           Wheelchair Mobility    Modified Rankin (Stroke Patients Only)       Balance Overall balance assessment: Needs assistance Sitting-balance support: Feet supported Sitting balance-Leahy Scale: Good     Standing balance support: During functional activity;Bilateral upper extremity supported Standing balance-Leahy Scale: Poor Standing balance comment: Unable to perform toielting ADLs due to required use of RW and leaned on counter when washing hands                            Cognition Arousal/Alertness: Awake/alert Behavior During Therapy: WFL for tasks assessed/performed Overall Cognitive Status: Within Functional Limits for tasks assessed                                        Exercises Total Joint Exercises Ankle Circles/Pumps: AROM;10 reps;Both;Seated Quad Sets: AROM;10 reps;Supine;Both Towel Squeeze: AROM;Both;10 reps;Supine Heel Slides: AAROM;Right;10 reps;Supine Hip ABduction/ADduction: AAROM;Right;10 reps;Supine Long Arc Quad: AAROM;Right;10 reps;Seated Knee Flexion: AAROM;Right;10 reps;Seated Goniometric ROM: R knee 0 to 70 degrees  General Comments General comments (skin integrity, edema, etc.): VSS      Pertinent Vitals/Pain Pain Assessment: 0-10 Pain Score: 5  Pain Location: Rt knee Pain Descriptors / Indicators: Aching;Discomfort;Sore Pain Intervention(s): Limited activity within patient's tolerance;Monitored during session;Ice applied    Home Living                      Prior Function            PT Goals (current goals can  now be found in the care plan section) Acute Rehab PT Goals Patient Stated Goal: get both knees fixed PT Goal Formulation: With patient Time For Goal Achievement: 07/25/19 Potential to Achieve Goals: Good Progress towards PT goals: Progressing toward goals    Frequency    7X/week      PT Plan Current plan remains appropriate    Co-evaluation              AM-PAC PT "6 Clicks" Mobility   Outcome Measure  Help needed turning from your back to your side while in a flat bed without using bedrails?: A Little Help needed moving from lying on your back to sitting on the side of a flat bed without using bedrails?: A Little Help needed moving to and from a bed to a chair (including a wheelchair)?: A Little Help needed standing up from a chair using your arms (e.g., wheelchair or bedside chair)?: A Lot Help needed to walk in hospital room?: A Little Help needed climbing 3-5 steps with a railing? : A Lot 6 Click Score: 16    End of Session Equipment Utilized During Treatment: Gait belt Activity Tolerance: Patient tolerated treatment well Patient left: in chair;with call bell/phone within reach;with chair alarm set Nurse Communication: Mobility status PT Visit Diagnosis: Muscle weakness (generalized) (M62.81);Difficulty in walking, not elsewhere classified (R26.2)     Time: 1610-9604 PT Time Calculation (min) (ACUTE ONLY): 38 min  Charges:  $Gait Training: 8-22 mins $Therapeutic Exercise: 8-22 mins $Therapeutic Activity: 8-22 mins                     Abran Richard, PT Acute Rehab Services Pager [phone] [facility] [place] 07/19/2019, 10:44 AM

## 2019-07-19 NOTE — Progress Notes (Signed)
Orthopedic Tech Progress Note Patient Details:  Virginia Flores Aug 26, 1950 [id number]  CPM Right Knee CPM Right Knee: On Right Knee Flexion (Degrees): 90 Right Knee Extension (Degrees): 0  Post Interventions Patient Tolerated: Well Instructions Provided: Care of device  [name] 07/19/2019, 6:32 PM

## 2019-07-19 NOTE — Progress Notes (Signed)
Orthopedic Tech Progress Note Patient Details:  Virginia Flores [birth year] [id number]  CPM Right Knee CPM Right Knee: Off Right Knee Flexion (Degrees): 90 Right Knee Extension (Degrees): 0  Post Interventions Patient Tolerated: Well Instructions Provided: Care of device  [name] 07/19/2019, 7:09 PM

## 2019-07-19 NOTE — Progress Notes (Signed)
Orthopedic Tech Progress Note Patient Details:  Virginia Flores 08-06-1950 [id number]  CPM Right Knee CPM Right Knee: Off Right Knee Flexion (Degrees): 90 Right Knee Extension (Degrees): 0  Post Interventions Patient Tolerated: Well Instructions Provided: Care of device  Virginia Flores 07/19/2019, 10:52 AM

## 2019-07-20 LAB — GLUCOSE, CAPILLARY
Glucose-Capillary: 142 mg/dL — ABNORMAL HIGH (ref 70–99)
Glucose-Capillary: 171 mg/dL — ABNORMAL HIGH (ref 70–99)

## 2019-07-20 NOTE — Progress Notes (Signed)
Physical Therapy Treatment Patient Details Name: Virginia Flores MRN: [id number] DOB: 1950-11-12 Today's Date: 07/20/2019    History of Present Illness Patient is 69 y.o. female s/p Rt TKA on 07/18/19 with PMH significant for lupus, osteoporosis, HTN, HLD, OA, CAD, DM, obesity.    PT Comments    Returned for 2nd attempt at step with pt able to complete with use of KI with daughter present to witness and for education. Pt educated for bed mobility, transfers, HEP and stairs. Encouraged increased ROM and gait particularly at home to maximize recovery.     Follow Up Recommendations  Follow surgeon's recommendation for DC plan and follow-up therapies;Home health PT;Supervision/Assistance - 24 hour     Equipment Recommendations  Rolling walker with 5" wheels;3in1 (PT)    Recommendations for Other Services       Precautions / Restrictions Precautions Precautions: Fall;Knee Restrictions Weight Bearing Restrictions: Yes RLE Weight Bearing: Weight bearing as tolerated    Mobility  Bed Mobility Overal bed mobility: Needs Assistance Bed Mobility: Supine to Sit;Sit to Supine     Supine to sit: Min guard Sit to supine: Min guard   General bed mobility comments: cues for sequence with use of rail and pt able to hook RLE with LLE to control off of and onto bed  Transfers Overall transfer level: Needs assistance   Transfers: Sit to/from Stand Sit to Stand: Min assist         General transfer comment: cues for hand placement, increased time, assist to rise  Ambulation/Gait Ambulation/Gait assistance: Min guard Gait Distance (Feet): 24 Feet Assistive device: Rolling walker (2 wheeled) Gait Pattern/deviations: Step-to pattern;Decreased stance time - right;Trunk flexed   Gait velocity interpretation: 1.31 - 2.62 ft/sec, indicative of limited community ambulator General Gait Details: cues for posture and proximity to RW with decreased stance and swing of RLE.   Stairs Stairs:  Yes Stairs assistance: Min assist Stair Management: Backwards;With walker Number of Stairs: 1 General stair comments: cues for sequence with min assist for stability   Wheelchair Mobility    Modified Rankin (Stroke Patients Only)       Balance Overall balance assessment: Needs assistance   Sitting balance-Leahy Scale: Good     Standing balance support: Bilateral upper extremity supported Standing balance-Leahy Scale: Poor Standing balance comment: bil UE support for standing and gait                            Cognition Arousal/Alertness: Awake/alert Behavior During Therapy: Flat affect Overall Cognitive Status: Impaired/Different from baseline Area of Impairment: Memory;Problem solving                             Problem Solving: Slow processing General Comments: pt resting on arrival and appeared more groggy this session with slow processing      Exercises Total Joint Exercises  Long Arc Quad: AAROM;Right;10 reps;Seated Marching in Standing: AAROM;Right;Seated;10 reps    General Comments        Pertinent Vitals/Pain Pain Score: 6  Pain Location: Rt knee Pain Descriptors / Indicators: Aching;Discomfort;Sore;Grimacing Pain Intervention(s): Limited activity within patient's tolerance;Monitored during session;RN gave pain meds during session;Repositioned    Home Living                      Prior Function            PT Goals (current goals can  now be found in the care plan section) Progress towards PT goals: Progressing toward goals    Frequency    7X/week      PT Plan Current plan remains appropriate    Co-evaluation              AM-PAC PT "6 Clicks" Mobility   Outcome Measure  Help needed turning from your back to your side while in a flat bed without using bedrails?: A Little Help needed moving from lying on your back to sitting on the side of a flat bed without using bedrails?: A Little Help needed  moving to and from a bed to a chair (including a wheelchair)?: A Little Help needed standing up from a chair using your arms (e.g., wheelchair or bedside chair)?: A Little Help needed to walk in hospital room?: A Little Help needed climbing 3-5 steps with a railing? : A Lot 6 Click Score: 17    End of Session Equipment Utilized During Treatment: Gait belt Activity Tolerance: Patient tolerated treatment well Patient left: with call bell/phone within reach;in bed;with family/visitor present Nurse Communication: Mobility status PT Visit Diagnosis: Muscle weakness (generalized) (M62.81);Difficulty in walking, not elsewhere classified (R26.2)     Time: 8887-5797 PT Time Calculation (min) (ACUTE ONLY): 29 min  Charges:  $Gait Training: 8-22 mins $Therapeutic Exercise: 8-22 mins $Therapeutic Activity: 8-22 mins                     Philo Kurtz P, PT Acute Rehabilitation Services Pager: [phone] Office: [phone]    Korrine Sicard B Tennis Mckinnon 07/20/2019, 2:51 PM

## 2019-07-20 NOTE — Progress Notes (Signed)
Patient discharged to home w/ family. Given all belongings, instructions, equipment. Verbalized understanding of instructions. Escorted to pov via w/c.  Patient's insurance, [employer], will contact her about home health and set up.

## 2019-07-20 NOTE — Progress Notes (Signed)
  Subjective: Virginia Flores is a 69 y.o. female s/p right TKA.  They are POD2.  Pt's pain is controlled.   Pt has ambulated with moderate difficulty. Feels much improved today and was able to ambulate further distance. Therapy session went well but unable to do stairs.    Objective: Vital signs in last 24 hours: Temp:  [99.8 F (37.7 C)-100.8 F (38.2 C)] 100.3 F (37.9 C) (07/18 0610) Pulse Rate:  [88-97] 97 (07/18 0610) Resp:  [20] 20 (07/18 0610) BP: (134-181)/(50-94) 150/64 (07/18 0610) SpO2:  [86 %-95 %] 94 % (07/18 0610)  Intake/Output from previous day: 07/17 0701 - 07/18 0700 In: 877.5 [P.O.:720; I.V.:157.5] Out: -  Intake/Output this shift: Total I/O In: 240 [P.O.:240] Out: -   Exam:  No gross blood or drainage overlying the dressing Right foot warm and well-perfused Sensation intact distally in the right foot Able to dorsiflex and plantarflex the right foot   Labs: Recent Labs    07/19/19 0324  HGB 10.7*   Recent Labs    07/19/19 0324  WBC 9.0  RBC 4.68  HCT 34.7*  PLT 183   Recent Labs    07/19/19 0324  NA 138  K 4.1  CL 102  CO2 28  BUN 26*  CREATININE 0.81  GLUCOSE 157*  CALCIUM 8.5*   No results for input(s): LABPT, INR in the last 72 hours.  Assessment/Plan: Pt is POD2 s/p right TKA.    -Plan to discharge to home today or tomorrow pending patient's pain and PT eval; 2nd session later today, okay for discharge home today if she can do stairs  -WBAT with a walker     Beronica Lansdale L Jafet Wissing 07/20/2019, 11:54 AM

## 2019-07-20 NOTE — Progress Notes (Signed)
Orthopedic Tech Progress Note Patient Details:  Virginia Flores 27-Jan-1950 [id number]  CPM Right Knee CPM Right Knee: Off Right Knee Flexion (Degrees): 90 Right Knee Extension (Degrees): 0  Post Interventions Patient Tolerated: Well Instructions Provided: Care of device  [name] 07/20/2019, 3:01 PM

## 2019-07-20 NOTE — TOC Initial Note (Signed)
Transition of Care [facility]) - Initial/Assessment Note    Patient Details  Name: Virginia Flores MRN: [id number] Date of Birth: Dec 18, 1950  [facility]) CM/SW Contact:    Joaquin Courts, RN Phone Number: 07/20/2019, 3:55 PM  Clinical Narrative:                 CM spoke with [employer] regarding need for [facility] services.  Referral faxed with requested documentation.  [employer] to arrange [facility] for patient.  [employer] delivered RW and 3in1.   Expected Discharge Plan: [place] Barriers to Discharge: No Barriers Identified   Patient Goals and CMS Choice Patient states their goals for this hospitalization and ongoing recovery are:: to go home with therapy      Expected Discharge Plan and Services Expected Discharge Plan: [place]   Discharge Planning Services: CM Consult   Living arrangements for the past 2 months: Single Family Home Expected Discharge Date: 07/20/19               DME Arranged: Gilford Rile rolling, 3-N-1 DME Agency: [employer] Date DME Agency Contacted: 07/19/19 Time DME Agency Contacted: 77 Representative spoke with at DME Agency: [place]:  (referral made to [employer])          Prior Living Arrangements/Services Living arrangements for the past 2 months: Single Family Home                     Activities of Daily Living Home Assistive Devices/Equipment: Cane (specify quad or straight), Walker (specify type), Other (Comment), Shower chair with back, Eyeglasses, Blood pressure cuff, Hand-held shower hose ADL Screening (condition at time of admission) Patient's cognitive ability adequate to safely complete daily activities?: Yes Is the patient deaf or have difficulty hearing?: No Does the patient have difficulty seeing, even when wearing glasses/contacts?: No Does the patient have difficulty concentrating, remembering, or making decisions?: No Patient able to express need for assistance with ADLs?:  Yes Does the patient have difficulty dressing or bathing?: No Independently performs ADLs?: Yes (appropriate for developmental age) Does the patient have difficulty walking or climbing stairs?: Yes Weakness of Legs: Left Weakness of Arms/Hands: None  Permission Sought/Granted                  Emotional Assessment              Admission diagnosis:  Status post total right knee replacement [Z96.651] Patient Active Problem List   Diagnosis Date Noted  . Status post total right knee replacement 07/18/2019  . Unilateral primary osteoarthritis, left knee 03/12/2019  . Unilateral primary osteoarthritis, right knee 03/12/2019  . Sciatica of left side 09/10/2017  . SLE (systemic lupus erythematosus) ([place]) 04/28/2016  . Rheumatoid arthritis ([place]) 06/06/2015  . Abnormal stress test   . Precordial pain   . Palpitations 10/14/2014  . Low back pain 09/04/2014  . Vitamin D deficiency 11/01/2013  . Osteoporosis 10/20/2013  . Musculoskeletal pain 03/21/2012  . GERD (gastroesophageal reflux disease) 04/01/2011  . Aortic valve disorder 03/31/2009  . HIATAL HERNIA, HX OF 07/27/2008  . BARIATRIC SURGERY STATUS 07/27/2008  . CAROTID ARTERY DISEASE 07/01/2008  . DIASTOLIC DYSFUNCTION 61/44/3154  . OBSTRUCTIVE SLEEP APNEA 05/01/2007  . Diabetes type 2, uncontrolled ([place]) 04/01/2007  . Hyperlipidemia 04/01/2007  . MORBID OBESITY 04/01/2007  . Essential hypertension 04/01/2007  . Degenerative arthritis of knee, bilateral 04/01/2007  . MURMUR 04/01/2007  . Shortness of breath 04/01/2007  .  SNORING, HX OF 04/01/2007   PCP:  Einar Pheasant, DO Pharmacy:   [id number] - [place], [place] - [address] AT [address] [address] Phone: [phone] Fax: [place], [place] - [address] [place] [address] Phone: [phone] Fax:  [phone]     Social Determinants of Health (SDOH) Interventions    Readmission Risk Interventions No flowsheet data found.

## 2019-07-20 NOTE — Progress Notes (Signed)
Orthopedic Tech Progress Note Patient Details:  Virginia Flores 09-02-1950 [id number]  CPM Right Knee CPM Right Knee: On Right Knee Flexion (Degrees): 90 Right Knee Extension (Degrees): 0  Post Interventions Patient Tolerated: Well Instructions Provided: Care of device  [name] 07/20/2019, 2:12 PM

## 2019-07-20 NOTE — Progress Notes (Signed)
Physical Therapy Treatment Patient Details Name: Virginia Flores MRN: [id number] DOB: [birth year] Today's Date: 07/20/2019    History of Present Illness Patient is 69 y.o. female s/p Rt TKA on 07/18/19 with PMH significant for lupus, osteoporosis, HTN, HLD, OA, CAD, DM, obesity.    PT Comments    Pt pleasant and very willing to work. Pt demonstrates decreased ROM and strength in Right knee even compared to last therapy session. Pt resistant to knee flexion and needs max cues to increase ROM. Pt with improved gait tolerance but remains unable to ascend a step which she has to enter home. Discussed options for entry into home with pt and aside from physical assist of spouse and son-in-law pt unsure how she could get into house. Pt states spouse maybe able to place ramp prior to home. Pt greatest limitation to home at this point is stairs for entry and will need to continue to master prior to return home unless other entry created.      Follow Up Recommendations  Follow surgeon's recommendation for DC plan and follow-up therapies;Home health PT;Supervision/Assistance - 24 hour     Equipment Recommendations  Rolling walker with 5" wheels;3in1 (PT)    Recommendations for Other Services       Precautions / Restrictions Precautions Precautions: Fall;Knee Restrictions Weight Bearing Restrictions: Yes RLE Weight Bearing: Weight bearing as tolerated    Mobility  Bed Mobility Overal bed mobility: Needs Assistance Bed Mobility: Supine to Sit     Supine to sit: Min guard;HOB elevated     General bed mobility comments: HOb 10 degrees with use of rail and cues to hook RLE with LLE and able to transition to EOB with guarding and increased time  Transfers Overall transfer level: Needs assistance   Transfers: Sit to/from Stand Sit to Stand: Min assist         General transfer comment: cues for hand placement, increased Right knee flexion and sequence to  stand  Ambulation/Gait Ambulation/Gait assistance: Min guard Gait Distance (Feet): 80 Feet Assistive device: Rolling walker (2 wheeled) Gait Pattern/deviations: Step-to pattern;Decreased stance time - right;Trunk flexed   Gait velocity interpretation: <1.8 ft/sec, indicate of risk for recurrent falls General Gait Details: cues for posture and proximity to RW with decreased stance and swing of RLE. Pt fatigue limiting distance. Walked to single step and educated for use but pt unable to lift leg to ascend step   Marine scientist Rankin (Stroke Patients Only)       Balance Overall balance assessment: Needs assistance   Sitting balance-Leahy Scale: Good     Standing balance support: Bilateral upper extremity supported Standing balance-Leahy Scale: Poor Standing balance comment: bil UE support for standing and gait                            Cognition Arousal/Alertness: Awake/alert Behavior During Therapy: WFL for tasks assessed/performed Overall Cognitive Status: Within Functional Limits for tasks assessed                                        Exercises Total Joint Exercises Heel Slides: AAROM;Right;Supine;15 reps Hip ABduction/ADduction: AAROM;Right;Supine;15 reps Straight Leg Raises: AAROM;Right;Supine;10 reps Long Arc Quad: AAROM;Right;10 reps;Seated;15 reps Goniometric ROM: 10-45 limited by pain    General  Comments        Pertinent Vitals/Pain Pain Score: 7  Pain Location: Rt knee Pain Descriptors / Indicators: Aching;Discomfort;Sore;Grimacing Pain Intervention(s): Limited activity within patient's tolerance;Monitored during session;Repositioned;Premedicated before session    Home Living                      Prior Function            PT Goals (current goals can now be found in the care plan section) Progress towards PT goals: Progressing toward goals    Frequency     7X/week      PT Plan Current plan remains appropriate    Co-evaluation              AM-PAC PT "6 Clicks" Mobility   Outcome Measure  Help needed turning from your back to your side while in a flat bed without using bedrails?: A Little Help needed moving from lying on your back to sitting on the side of a flat bed without using bedrails?: A Little Help needed moving to and from a bed to a chair (including a wheelchair)?: A Little Help needed standing up from a chair using your arms (e.g., wheelchair or bedside chair)?: A Little Help needed to walk in hospital room?: A Little Help needed climbing 3-5 steps with a railing? : A Lot 6 Click Score: 17    End of Session Equipment Utilized During Treatment: Gait belt Activity Tolerance: Patient tolerated treatment well Patient left: with call bell/phone within reach;in chair Nurse Communication: Mobility status PT Visit Diagnosis: Muscle weakness (generalized) (M62.81);Difficulty in walking, not elsewhere classified (R26.2)     Time: 1610-9604 PT Time Calculation (min) (ACUTE ONLY): 29 min  Charges:  $Gait Training: 8-22 mins $Therapeutic Exercise: 8-22 mins                     Darlette Dubow P, PT Acute Rehabilitation Services Pager: [phone] Office: [place] 07/20/2019, 12:44 PM

## 2019-07-21 ENCOUNTER — Telehealth: Payer: Self-pay | Admitting: Orthopaedic Surgery

## 2019-07-21 ENCOUNTER — Encounter (HOSPITAL_COMMUNITY): Payer: Self-pay | Admitting: Orthopaedic Surgery

## 2019-07-21 NOTE — Telephone Encounter (Signed)
Duplicate

## 2019-07-21 NOTE — Telephone Encounter (Signed)
Received call from Gennaro Africa (PT) with [facility] she advised unable to reach patient by phone. The number to contact patient is [phone]

## 2019-07-21 NOTE — Telephone Encounter (Signed)
[facility] called.   The patient's insurance is through [place] so they will not be able to treat her. She needs a referral to another facility.

## 2019-07-21 NOTE — Telephone Encounter (Signed)
[facility], called back. Patient has Virginia Flores, unable to see patient due to insurance.

## 2019-07-22 NOTE — Telephone Encounter (Signed)
faxed

## 2019-07-22 NOTE — Telephone Encounter (Signed)
Hey, I saw that you have marked the last phone encounter yesterday a "duplicate". Have you had a chance to send another referral?

## 2019-07-22 NOTE — Telephone Encounter (Signed)
Will send to [place]: [phone]

## 2019-07-24 ENCOUNTER — Telehealth: Payer: Self-pay | Admitting: Orthopaedic Surgery

## 2019-07-24 NOTE — Telephone Encounter (Signed)
Received call from pt, checking if we received fmla forms for her daughter, Lucious Groves. I advised we haven't received those forms as of yet. (Patient is aware of the protocol of using [employer] to complete forms)

## 2019-07-28 ENCOUNTER — Telehealth: Payer: Self-pay | Admitting: Orthopaedic Surgery

## 2019-07-28 NOTE — Telephone Encounter (Signed)
Patient called advised [facility] need an order for (PT) [place] is in Network with her insurance company. The fax # is [phone]   The number to contact patient is [phone]

## 2019-07-29 NOTE — Telephone Encounter (Signed)
Faxed to provided number  

## 2019-07-30 ENCOUNTER — Telehealth: Payer: Self-pay | Admitting: Orthopaedic Surgery

## 2019-07-30 NOTE — Telephone Encounter (Signed)
Received call from Butch Penny with [facility] she advised they are not able to except referral.  The number to contact Butch Penny is [phone]

## 2019-07-30 NOTE — Telephone Encounter (Signed)
FYI - [facility] unable to accept patient, [facility] unable to accept patient. She does have appointment with Artis Delay tomorrow.

## 2019-07-31 ENCOUNTER — Encounter: Payer: Self-pay | Admitting: Physician Assistant

## 2019-07-31 ENCOUNTER — Ambulatory Visit (INDEPENDENT_AMBULATORY_CARE_PROVIDER_SITE_OTHER): Payer: 59 | Admitting: Physician Assistant

## 2019-07-31 DIAGNOSIS — Z96651 Presence of right artificial knee joint: Secondary | ICD-10-CM

## 2019-07-31 MED ORDER — DOXYCYCLINE HYCLATE 100 MG PO TABS
100.0000 mg | ORAL_TABLET | Freq: Two times a day (BID) | ORAL | 0 refills | Status: AC
Start: 2019-07-31 — End: 2019-08-14
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
## 2019-07-31 NOTE — Discharge Summary (Signed)
Physician Discharge Summary      Patient ID: Virginia Flores MRN: [id number] DOB/AGE: [birth year] 69 y.o.  Admit date: 07/18/2019 Discharge date: 07/23/19  Admission Diagnoses:  Principal Problem:   Unilateral primary osteoarthritis, right knee Active Problems:   Status post total right knee replacement   Discharge Diagnoses:  Same  Surgeries: Procedure(s): RIGHT TOTAL KNEE ARTHROPLASTY on 07/18/2019   Consultants:   Discharged Condition: Stable  Hospital Course: Virginia Flores is an 69 y.o. female who was admitted 07/18/2019 with a chief complaint of right knee pain, and found to have a diagnosis of right knee OA.  They were brought to the operating room on 07/18/2019 and underwent the above named procedures.  Pt awoke from anesthesia without complication and was transferred to the floor. On POD1, patient's pain was controlled overall but her mobility was lacking.  Her mobility progressed on POD2 and she was subsequently discharged home on POD2.  Pt will f/u with Dr. Ninfa Linden in clinic in ~2 weeks.   Antibiotics given:  Anti-infectives (From admission, onward)   Start     Dose/Rate Route Frequency Ordered Stop   07/18/19 1800  ceFAZolin (ANCEF) IVPB 1 g/50 mL premix        1 g 100 mL/hr over 30 Minutes Intravenous Every 6 hours 07/18/19 1549 07/18/19 2341   07/18/19 1100  ceFAZolin (ANCEF) 3 g in dextrose 5 % 50 mL IVPB        3 g 100 mL/hr over 30 Minutes Intravenous On call to O.R. 07/17/19 9562 07/18/19 1315    .  Recent vital signs:  Vitals:   07/20/19 0610 07/20/19 1353  BP: (!) 150/64 (!) 102/57  Pulse: 97 80  Resp: 20 17  Temp: 100.3 F (37.9 C) 99.1 F (37.3 C)  SpO2: 94% 93%    Recent laboratory studies:  Results for orders placed or performed during the hospital encounter of 07/18/19  Glucose, capillary  Result Value Ref Range   Glucose-Capillary 128 (H) 70 - 99 mg/dL  Glucose, capillary  Result Value Ref Range   Glucose-Capillary 127 (H) 70 - 99  mg/dL  CBC  Result Value Ref Range   WBC 9.0 4.0 - 10.5 K/uL   RBC 4.68 3.87 - 5.11 MIL/uL   Hemoglobin 10.7 (L) 12.0 - 15.0 g/dL   HCT 34.7 (L) 36 - 46 %   MCV 74.1 (L) 80.0 - 100.0 fL   MCH 22.9 (L) 26.0 - 34.0 pg   MCHC 30.8 30.0 - 36.0 g/dL   RDW 17.3 (H) 11.5 - 15.5 %   Platelets 183 150 - 400 K/uL   nRBC 0.0 0.0 - 0.2 %  Basic metabolic panel  Result Value Ref Range   Sodium 138 135 - 145 mmol/L   Potassium 4.1 3.5 - 5.1 mmol/L   Chloride 102 98 - 111 mmol/L   CO2 28 22 - 32 mmol/L   Glucose, Bld 157 (H) 70 - 99 mg/dL   BUN 26 (H) 8 - 23 mg/dL   Creatinine, Ser 0.81 0.44 - 1.00 mg/dL   Calcium 8.5 (L) 8.9 - 10.3 mg/dL   GFR calc non Af Amer >60 >60 mL/min   GFR calc Af Amer >60 >60 mL/min   Anion gap 8 5 - 15  Glucose, capillary  Result Value Ref Range   Glucose-Capillary 145 (H) 70 - 99 mg/dL  Glucose, capillary  Result Value Ref Range   Glucose-Capillary 147 (H) 70 - 99 mg/dL  Glucose, capillary  Result  Value Ref Range   Glucose-Capillary 128 (H) 70 - 99 mg/dL  Glucose, capillary  Result Value Ref Range   Glucose-Capillary 179 (H) 70 - 99 mg/dL  Glucose, capillary  Result Value Ref Range   Glucose-Capillary 172 (H) 70 - 99 mg/dL  Glucose, capillary  Result Value Ref Range   Glucose-Capillary 160 (H) 70 - 99 mg/dL  Glucose, capillary  Result Value Ref Range   Glucose-Capillary 142 (H) 70 - 99 mg/dL  Glucose, capillary  Result Value Ref Range   Glucose-Capillary 171 (H) 70 - 99 mg/dL  Type and screen Order type and screen if day of surgery is less than 15 days from draw of preadmission visit or order morning of surgery if day of surgery is greater than 6 days from preadmission visit.  Result Value Ref Range   ABO/RH(D) O POS    Antibody Screen NEG    Sample Expiration      07/21/2019,2359 Performed at [facility], [place] [address], [place], [place] [address]     Discharge Medications:   Allergies as of 07/20/2019      Reactions    Aspirin    GI upset      Medication List    STOP taking these medications   diclofenac 75 MG EC tablet Commonly known as: VOLTAREN   traMADol 50 MG tablet Commonly known as: ULTRAM     TAKE these medications   acetaminophen 650 MG CR tablet Commonly known as: TYLENOL Take 650 mg by mouth in the morning and at bedtime.   amLODipine 10 MG tablet Commonly known as: NORVASC Take 10 mg by mouth daily.   aspirin 81 MG chewable tablet Chew 1 tablet (81 mg total) by mouth 2 (two) times daily.   furosemide 20 MG tablet Commonly known as: LASIX Take 20 mg by mouth daily as needed (fluid retention.).   glucose blood test strip Commonly known as: ONE TOUCH ULTRA TEST Use as instructed to check blood sugar once a day.  DX E11.65   hydrochlorothiazide 12.5 MG tablet Commonly known as: HYDRODIURIL Take 12.5 mg by mouth daily.   Januvia 100 MG tablet Generic drug: sitaGLIPtin Take 100 mg by mouth daily.   labetalol 200 MG tablet Commonly known as: NORMODYNE Take 400 mg by mouth 2 (two) times daily.   metFORMIN 500 MG tablet Commonly known as: GLUCOPHAGE Take 1,000 mg by mouth 2 (two) times daily as needed (blood sugars greater than 140).   methocarbamol 500 MG tablet Commonly known as: ROBAXIN Take 500 mg by mouth 2 (two) times daily as needed for muscle spasms. What changed: Another medication with the same name was added. Make sure you understand how and when to take each.   methocarbamol 500 MG tablet Commonly known as: ROBAXIN Take 1 tablet (500 mg total) by mouth every 6 (six) hours as needed for muscle spasms. What changed: You were already taking a medication with the same name, and this prescription was added. Make sure you understand how and when to take each.   onetouch ultrasoft lancets Use as instructed to check blood sugar once a day.  DX E11.6   OVER THE COUNTER MEDICATION CBD (Hemp OIL). Take 15mg  at bedtime.   oxyCODONE 5 MG immediate release  tablet Commonly known as: Oxy IR/ROXICODONE Take 1-2 tablets (5-10 mg total) by mouth every 4 (four) hours as needed for moderate pain (pain score 4-6).   rosuvastatin 40 MG tablet Commonly known as: CRESTOR Take 1 tablet (  40 mg total) by mouth daily. What changed: when to take this   Trulicity 2.26 JF/3.5KT Sopn Generic drug: Dulaglutide Inject 0.75 mg into the skin every Friday.   Vitamin D-3 125 MCG (5000 UT) Tabs Take 5,000 Units by mouth in the morning and at bedtime.       Diagnostic Studies: DG Knee Right Port  Result Date: 07/18/2019 CLINICAL DATA:  69 year old female status post right knee arthroplasty. EXAM: PORTABLE RIGHT KNEE - 1-2 VIEW COMPARISON:  Right knee radiograph dated 03/12/2019. FINDINGS: There is a total right knee arthroplasty. The arthroplasty components appear intact and in anatomic alignment. There is no acute fracture or dislocation. The bones are osteopenic. Postsurgical changes with fluid and air in the anterior joint and soft tissues. Anterior knee cutaneous staples. IMPRESSION: Status post total right knee arthroplasty. No immediate complication. Electronically Signed   By: Anner Crete M.D.   On: 07/18/2019 20:29    Disposition: Discharge disposition: 01-Home or Self Care       Discharge Instructions    Call MD / Call 911   Complete by: As directed    If you experience chest pain or shortness of breath, CALL 911 and be transported to the hospital emergency room.  If you develope a fever above 101 F, pus (white drainage) or increased drainage or redness at the wound, or calf pain, call your surgeon's office.   Constipation Prevention   Complete by: As directed    Drink plenty of fluids.  Prune juice may be helpful.  You may use a stool softener, such as Colace (over the counter) 100 mg twice a day.  Use MiraLax (over the counter) for constipation as needed.   Diet - low sodium heart healthy   Complete by: As directed    Discharge  instructions   Complete by: As directed    You may shower, dressing is waterproof.  Do not remove the dressing, we will remove it at your first post-op appointment.  Do not take a bath or soak the knee in a tub or pool.  You may weightbear as you can tolerate on the operative leg with a walker.  Use the blue cradle boot under your heel to work on getting your leg straight.  Do NOT put a pillow under your knee.  You will follow-up with Dr. Ninfa Linden in the clinic in 2 weeks at your given appointment date.   Increase activity slowly as tolerated   Complete by: As directed        Follow-up Information    Mcarthur Rossetti, MD Follow up in 2 week(s).   Specialty: Orthopedic Surgery Contact information: [address] [place] [address] [phone]                Signed: Donella Stade 07/31/2019, 8:59 AM

## 2019-08-01 ENCOUNTER — Other Ambulatory Visit: Payer: Self-pay | Admitting: Radiology

## 2019-08-01 DIAGNOSIS — Z96651 Presence of right artificial knee joint: Secondary | ICD-10-CM

## 2019-08-05 ENCOUNTER — Other Ambulatory Visit: Payer: Self-pay

## 2019-08-05 ENCOUNTER — Ambulatory Visit: Payer: 59 | Attending: Physician Assistant | Admitting: Physical Therapy

## 2019-08-05 ENCOUNTER — Encounter: Payer: Self-pay | Admitting: Physical Therapy

## 2019-08-05 DIAGNOSIS — M25561 Pain in right knee: Secondary | ICD-10-CM

## 2019-08-05 DIAGNOSIS — R262 Difficulty in walking, not elsewhere classified: Secondary | ICD-10-CM | POA: Diagnosis present

## 2019-08-05 DIAGNOSIS — M6281 Muscle weakness (generalized): Secondary | ICD-10-CM | POA: Insufficient documentation

## 2019-08-05 DIAGNOSIS — M25661 Stiffness of right knee, not elsewhere classified: Secondary | ICD-10-CM | POA: Insufficient documentation
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
## 2019-08-11 ENCOUNTER — Other Ambulatory Visit: Payer: Self-pay | Admitting: Orthopaedic Surgery

## 2019-08-11 ENCOUNTER — Ambulatory Visit (INDEPENDENT_AMBULATORY_CARE_PROVIDER_SITE_OTHER): Payer: 59 | Admitting: Orthopaedic Surgery

## 2019-08-11 ENCOUNTER — Encounter: Payer: Self-pay | Admitting: Orthopaedic Surgery

## 2019-08-11 DIAGNOSIS — Z96651 Presence of right artificial knee joint: Secondary | ICD-10-CM

## 2019-08-11 NOTE — Telephone Encounter (Signed)
Refill

## 2019-08-11 NOTE — Progress Notes (Signed)
The patient is 3 weeks out from a right total knee arthroplasty.  She is only taken occasional tramadol and Tylenol at night.  We wanted to see her back this week just to make sure her neck incision looked okay.  Empirically we started doxycycline last week after a small draining wound area.  The draining is gone at this point.  There are some residual staples then removed today.  On examination of her right knee today her incision looks great.  It is healing over nicely.  She has pretty good range of motion overall with her knee at 3 weeks with almost full extension to flexion right at 90 degrees.  She starts outpatient physical therapy tomorrow.  She will increase her activities as comfort allows and work aggressively on her motion.  I would like to see her back in 4 weeks for repeat exam but no x-rays are needed.  If there is any issues before then she will let us know.  All questions and concerns were answered and addressed.

## 2019-08-12 ENCOUNTER — Other Ambulatory Visit: Payer: Self-pay

## 2019-08-12 ENCOUNTER — Ambulatory Visit: Payer: 59

## 2019-08-12 DIAGNOSIS — R262 Difficulty in walking, not elsewhere classified: Secondary | ICD-10-CM

## 2019-08-12 DIAGNOSIS — M25661 Stiffness of right knee, not elsewhere classified: Secondary | ICD-10-CM

## 2019-08-12 DIAGNOSIS — M25561 Pain in right knee: Secondary | ICD-10-CM

## 2019-08-12 DIAGNOSIS — M6281 Muscle weakness (generalized): Secondary | ICD-10-CM
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
## 2019-08-14 ENCOUNTER — Ambulatory Visit: Payer: 59

## 2019-08-14 ENCOUNTER — Other Ambulatory Visit: Payer: Self-pay

## 2019-08-14 DIAGNOSIS — R262 Difficulty in walking, not elsewhere classified: Secondary | ICD-10-CM

## 2019-08-14 DIAGNOSIS — M25561 Pain in right knee: Secondary | ICD-10-CM | POA: Diagnosis not present

## 2019-08-14 DIAGNOSIS — M6281 Muscle weakness (generalized): Secondary | ICD-10-CM

## 2019-08-14 DIAGNOSIS — M25661 Stiffness of right knee, not elsewhere classified: Secondary | ICD-10-CM
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
## 2019-08-19 ENCOUNTER — Ambulatory Visit: Payer: 59 | Admitting: Physical Therapy

## 2019-08-19 ENCOUNTER — Encounter: Payer: Self-pay | Admitting: Physical Therapy

## 2019-08-19 ENCOUNTER — Other Ambulatory Visit: Payer: Self-pay

## 2019-08-19 DIAGNOSIS — M6281 Muscle weakness (generalized): Secondary | ICD-10-CM

## 2019-08-19 DIAGNOSIS — R262 Difficulty in walking, not elsewhere classified: Secondary | ICD-10-CM

## 2019-08-19 DIAGNOSIS — M25661 Stiffness of right knee, not elsewhere classified: Secondary | ICD-10-CM

## 2019-08-19 DIAGNOSIS — M25561 Pain in right knee: Secondary | ICD-10-CM
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
## 2019-08-21 ENCOUNTER — Ambulatory Visit: Payer: 59

## 2019-08-21 ENCOUNTER — Other Ambulatory Visit: Payer: Self-pay

## 2019-08-21 DIAGNOSIS — M6281 Muscle weakness (generalized): Secondary | ICD-10-CM

## 2019-08-21 DIAGNOSIS — M25561 Pain in right knee: Secondary | ICD-10-CM

## 2019-08-21 DIAGNOSIS — M25661 Stiffness of right knee, not elsewhere classified: Secondary | ICD-10-CM

## 2019-08-21 DIAGNOSIS — R262 Difficulty in walking, not elsewhere classified: Secondary | ICD-10-CM

## 2019-08-21 MED FILL — JANUVIA 100 MG TABLET: 100 | 30 days supply | Qty: 30 | Fill #1

## 2019-08-21 MED FILL — traMADol HCL 50 MG TABS: 50 | 30 days supply | Qty: 90 | Fill #4
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
## 2019-08-26 ENCOUNTER — Ambulatory Visit: Payer: 59 | Admitting: Physical Therapy

## 2019-08-26 ENCOUNTER — Encounter: Payer: Self-pay | Admitting: Physical Therapy

## 2019-08-26 ENCOUNTER — Other Ambulatory Visit: Payer: Self-pay

## 2019-08-26 DIAGNOSIS — M25661 Stiffness of right knee, not elsewhere classified: Secondary | ICD-10-CM

## 2019-08-26 DIAGNOSIS — M25561 Pain in right knee: Secondary | ICD-10-CM

## 2019-08-26 DIAGNOSIS — R262 Difficulty in walking, not elsewhere classified: Secondary | ICD-10-CM

## 2019-08-26 DIAGNOSIS — M6281 Muscle weakness (generalized): Secondary | ICD-10-CM
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
## 2019-08-28 ENCOUNTER — Ambulatory Visit: Payer: 59

## 2019-08-28 MED FILL — DICLOFENAC SODIUM 75 MG TAB: 75 | 90 days supply | Qty: 180 | Fill #0

## 2019-09-01 ENCOUNTER — Ambulatory Visit (INDEPENDENT_AMBULATORY_CARE_PROVIDER_SITE_OTHER): Payer: 59 | Admitting: Orthopaedic Surgery

## 2019-09-01 ENCOUNTER — Encounter: Payer: Self-pay | Admitting: Orthopaedic Surgery

## 2019-09-01 DIAGNOSIS — M1712 Unilateral primary osteoarthritis, left knee: Secondary | ICD-10-CM

## 2019-09-01 DIAGNOSIS — Z96651 Presence of right artificial knee joint: Secondary | ICD-10-CM

## 2019-09-01 NOTE — Progress Notes (Signed)
The patient is now between 6 and 7 weeks status post a right total knee arthroplasty.  She has made excellent progress at this point with therapy and states her range of motion and strength are improving.  She would like [place] to consider replacing her left knee in November of this year.  She has severe end-stage arthritis of the left knee.  On examination of the right knee, her right knee incisions healed.  Her calf is soft.  Her extension is full and her flexion is to well past 90 degrees and more.  The knee feels stable as well.  We will see her back in 4 weeks from now to see how she is doing overall.  No x-rays needed.  At that point we can work on getting her scheduled for her left knee replacement.  All questions and concerns were answered and addressed.

## 2019-09-02 ENCOUNTER — Other Ambulatory Visit: Payer: Self-pay

## 2019-09-02 ENCOUNTER — Ambulatory Visit: Payer: 59 | Admitting: Physical Therapy

## 2019-09-02 ENCOUNTER — Encounter: Payer: Self-pay | Admitting: Physical Therapy

## 2019-09-02 DIAGNOSIS — M25561 Pain in right knee: Secondary | ICD-10-CM

## 2019-09-02 DIAGNOSIS — R262 Difficulty in walking, not elsewhere classified: Secondary | ICD-10-CM

## 2019-09-02 DIAGNOSIS — M25661 Stiffness of right knee, not elsewhere classified: Secondary | ICD-10-CM

## 2019-09-02 DIAGNOSIS — M6281 Muscle weakness (generalized): Secondary | ICD-10-CM
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
## 2019-09-04 ENCOUNTER — Other Ambulatory Visit: Payer: Self-pay

## 2019-09-04 ENCOUNTER — Ambulatory Visit: Payer: 59 | Attending: Physician Assistant

## 2019-09-04 DIAGNOSIS — M6281 Muscle weakness (generalized): Secondary | ICD-10-CM | POA: Diagnosis present

## 2019-09-04 DIAGNOSIS — R262 Difficulty in walking, not elsewhere classified: Secondary | ICD-10-CM | POA: Diagnosis present

## 2019-09-04 DIAGNOSIS — M25561 Pain in right knee: Secondary | ICD-10-CM | POA: Insufficient documentation

## 2019-09-04 DIAGNOSIS — M25661 Stiffness of right knee, not elsewhere classified: Secondary | ICD-10-CM
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
## 2019-09-09 ENCOUNTER — Other Ambulatory Visit: Payer: Self-pay

## 2019-09-09 ENCOUNTER — Ambulatory Visit: Payer: 59

## 2019-09-09 DIAGNOSIS — R262 Difficulty in walking, not elsewhere classified: Secondary | ICD-10-CM

## 2019-09-09 DIAGNOSIS — M6281 Muscle weakness (generalized): Secondary | ICD-10-CM

## 2019-09-09 DIAGNOSIS — M25661 Stiffness of right knee, not elsewhere classified: Secondary | ICD-10-CM

## 2019-09-09 DIAGNOSIS — M25561 Pain in right knee: Secondary | ICD-10-CM
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
## 2019-09-11 ENCOUNTER — Ambulatory Visit: Payer: 59 | Admitting: Physical Therapy

## 2019-09-11 ENCOUNTER — Other Ambulatory Visit: Payer: Self-pay

## 2019-09-11 ENCOUNTER — Encounter: Payer: Self-pay | Admitting: Physical Therapy

## 2019-09-11 DIAGNOSIS — R262 Difficulty in walking, not elsewhere classified: Secondary | ICD-10-CM

## 2019-09-11 DIAGNOSIS — M6281 Muscle weakness (generalized): Secondary | ICD-10-CM

## 2019-09-11 DIAGNOSIS — M25661 Stiffness of right knee, not elsewhere classified: Secondary | ICD-10-CM

## 2019-09-11 DIAGNOSIS — M25561 Pain in right knee: Secondary | ICD-10-CM | POA: Diagnosis not present
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
## 2019-09-18 ENCOUNTER — Encounter: Payer: 59 | Admitting: Physical Therapy

## 2019-09-23 ENCOUNTER — Ambulatory Visit: Payer: 59 | Admitting: Physical Therapy

## 2019-09-25 ENCOUNTER — Ambulatory Visit: Payer: 59 | Admitting: Physical Therapy

## 2019-09-29 ENCOUNTER — Encounter: Payer: Self-pay | Admitting: Orthopaedic Surgery

## 2019-09-29 ENCOUNTER — Ambulatory Visit (INDEPENDENT_AMBULATORY_CARE_PROVIDER_SITE_OTHER): Payer: 59 | Admitting: Orthopaedic Surgery

## 2019-09-29 DIAGNOSIS — M25562 Pain in left knee: Secondary | ICD-10-CM

## 2019-09-29 DIAGNOSIS — G8929 Other chronic pain: Secondary | ICD-10-CM

## 2019-09-29 DIAGNOSIS — M1712 Unilateral primary osteoarthritis, left knee: Secondary | ICD-10-CM

## 2019-09-29 DIAGNOSIS — Z96651 Presence of right artificial knee joint: Secondary | ICD-10-CM

## 2019-09-29 NOTE — Progress Notes (Signed)
The patient is well-known to me.  She is just over 2 months status post a right total knee arthroplasty that has done very well for her.  She has known well-documented end-stage arthritis of her left knee.  Given the success of her right total knee arthroplasty.  She is ready to proceed with her left knee being replaced and early November of this year.  She is already been released from physical therapy.  She is ambulate a cane mainly due to the pain that she has in her left arthritic knee.  Examination of her right total knee shows excellent range of motion of that knee and is ligamentously stable.  The left knee has some valgus malalignment and is painful throughout the arc of motion.  Previous x-rays of left knee show severe end-stage arthritis.  At this point we will work on getting her on the operative schedule for a left total knee arthroplasty in early November.  Having had this before she is fully aware of the interoperative and postoperative course as well as the risk and benefits of surgery.

## 2019-10-13 ENCOUNTER — Telehealth: Payer: Self-pay | Admitting: Orthopaedic Surgery

## 2019-10-13 NOTE — Telephone Encounter (Signed)
[employer] forms received. Sent to [employer] 

## 2019-12-05 ENCOUNTER — Other Ambulatory Visit: Payer: Self-pay | Admitting: Physician Assistant

## 2019-12-08 NOTE — Progress Notes (Signed)
DUE TO COVID-19 ONLY ONE VISITOR IS ALLOWED TO COME WITH YOU AND STAY IN THE WAITING ROOM ONLY DURING PRE OP AND PROCEDURE DAY OF SURGERY. THE 1 VISITOR  MAY VISIT WITH YOU AFTER SURGERY IN YOUR PRIVATE ROOM DURING VISITING HOURS ONLY!  YOU NEED TO HAVE A COVID 19 TEST ON__12/08/2019 _____ @_1245  pm ______, THIS TEST MUST BE DONE BEFORE SURGERY,  COVID TESTING SITE [address] [place] [address], IT IS ON THE RIGHT GOING OUT [address] APPROXIMATELY  2 MINUTES PAST [employer] ON THE RIGHT. ONCE YOUR COVID TEST IS COMPLETED,  PLEASE BEGIN THE QUARANTINE INSTRUCTIONS AS OUTLINED IN YOUR HANDOUT.                Virginia Flores  12/08/2019   Your procedure is scheduled on: 12/12/2019    Report to [facility] Main  Entrance   Report to admitting at   1100 AM     Call this number if you have problems the morning of surgery [phone]    REMEMBER: NO  SOLID FOOD CANDY OR GUM AFTER MIDNIGHT. CLEAR LIQUIDS UNTIL   1030 am         . NOTHING BY MOUTH EXCEPT CLEAR LIQUIDS UNTIL    . PLEASE FINISH ENSURE DRINK PER SURGEON ORDER  WHICH NEEDS TO BE COMPLETED AT    1030am   .      CLEAR LIQUID DIET   Foods Allowed                                                                    Coffee and tea, regular and decaf                            Fruit ices (not with fruit pulp)                                      Iced Popsicles                                    Carbonated beverages, regular and diet                                    Cranberry, grape and apple juices Sports drinks like Gatorade Lightly seasoned clear broth or consume(fat free) Sugar, honey syrup ___________________________________________________________________      BRUSH YOUR TEETH MORNING OF SURGERY AND RINSE YOUR MOUTH OUT, NO CHEWING GUM CANDY OR MINTS.     Take these medicines the morning of surgery with A SIP OF WATER: amlodipine, labetalol   DO NOT TAKE ANY DIABETIC MEDICATIONS DAY  OF YOUR SURGERY                               You may not have any metal on your body including hair pins and  piercings  Do not wear jewelry, make-up, lotions, powders or perfumes, deodorant             Do not wear nail polish on your fingernails.  Do not shave  48 hours prior to surgery.              Men may shave face and neck.   Do not bring valuables to the hospital. [place].  Contacts, dentures or bridgework may not be worn into surgery.  Leave suitcase in the car. After surgery it may be brought to your room.     Patients discharged the day of surgery will not be allowed to drive home. IF YOU ARE HAVING SURGERY AND GOING HOME THE SAME DAY, YOU MUST HAVE AN ADULT TO DRIVE YOU HOME AND BE WITH YOU FOR 24 HOURS. YOU MAY GO HOME BY TAXI OR UBER OR ORTHERWISE, BUT AN ADULT MUST ACCOMPANY YOU HOME AND STAY WITH YOU FOR 24 HOURS.  Name and phone number of your driver:  Special Instructions: N/A              Please read over the following fact sheets you were given: _____________________________________________________________________  [facility] - Preparing for Surgery Before surgery, you can play an important role.  Because skin is not sterile, your skin needs to be as free of germs as possible.  You can reduce the number of germs on your skin by washing with CHG (chlorahexidine gluconate) soap before surgery.  CHG is an antiseptic cleaner which kills germs and bonds with the skin to continue killing germs even after washing. Please DO NOT use if you have an allergy to CHG or antibacterial soaps.  If your skin becomes reddened/irritated stop using the CHG and inform your nurse when you arrive at Short Stay. Do not shave (including legs and underarms) for at least 48 hours prior to the first CHG shower.  You may shave your face/neck. Please follow these instructions carefully:  1.  Shower with CHG Soap the night before surgery  and the  morning of Surgery.  2.  If you choose to wash your hair, wash your hair first as usual with your  normal  shampoo.  3.  After you shampoo, rinse your hair and body thoroughly to remove the  shampoo.                           4.  Use CHG as you would any other liquid soap.  You can apply chg directly  to the skin and wash                       Gently with a scrungie or clean washcloth.  5.  Apply the CHG Soap to your body ONLY FROM THE NECK DOWN.   Do not use on face/ open                           Wound or open sores. Avoid contact with eyes, ears mouth and genitals (private parts).                       Wash face,  Genitals (private parts) with your normal soap.             6.  Wash thoroughly, paying special attention to the area where your surgery  will be performed.  7.  Thoroughly rinse your body with warm water from the neck down.  8.  DO NOT shower/wash with your normal soap after using and rinsing off  the CHG Soap.                9.  Pat yourself dry with a clean towel.            10.  Wear clean pajamas.            11.  Place clean sheets on your bed the night of your first shower and do not  sleep with pets. Day of Surgery : Do not apply any lotions/deodorants the morning of surgery.  Please wear clean clothes to the hospital/surgery center.  FAILURE TO FOLLOW THESE INSTRUCTIONS MAY RESULT IN THE CANCELLATION OF YOUR SURGERY PATIENT SIGNATURE_________________________________  NURSE SIGNATURE__________________________________  ________________________________________________________________________

## 2019-12-10 ENCOUNTER — Other Ambulatory Visit (HOSPITAL_COMMUNITY)
Admission: RE | Admit: 2019-12-10 | Discharge: 2019-12-10 | Disposition: A | Discharge status: Home / Self Care | Payer: Medicare Other | Source: Ambulatory Visit | Attending: Orthopaedic Surgery | Admitting: Orthopaedic Surgery

## 2019-12-10 ENCOUNTER — Encounter (HOSPITAL_COMMUNITY): Payer: Self-pay

## 2019-12-10 ENCOUNTER — Encounter (HOSPITAL_COMMUNITY)
Admission: RE | Admit: 2019-12-10 | Discharge: 2019-12-10 | Disposition: A | Discharge status: Home / Self Care | Payer: Medicare Other | Source: Ambulatory Visit | Attending: Orthopaedic Surgery | Admitting: Orthopaedic Surgery

## 2019-12-10 ENCOUNTER — Other Ambulatory Visit: Payer: Self-pay

## 2019-12-10 DIAGNOSIS — Z01812 Encounter for preprocedural laboratory examination: Secondary | ICD-10-CM | POA: Insufficient documentation

## 2019-12-10 DIAGNOSIS — Z20822 Contact with and (suspected) exposure to covid-19: Secondary | ICD-10-CM | POA: Diagnosis not present

## 2019-12-10 LAB — BASIC METABOLIC PANEL
Anion gap: 10 (ref 5–15)
BUN: 25 mg/dL — ABNORMAL HIGH (ref 8–23)
CO2: 26 mmol/L (ref 22–32)
Calcium: 8.9 mg/dL (ref 8.9–10.3)
Chloride: 107 mmol/L (ref 98–111)
Creatinine, Ser: 0.82 mg/dL (ref 0.44–1.00)
GFR, Estimated: >60 mL/min (ref >60)
Glucose, Bld: 99 mg/dL (ref 70–99)
Potassium: 3.6 mmol/L (ref 3.5–5.1)
Sodium: 143 mmol/L (ref 135–145)

## 2019-12-10 LAB — CBC
HCT: 38.5 % (ref 36.0–46.0)
Hemoglobin: 12 g/dL (ref 12.0–15.0)
MCH: 22.6 pg — ABNORMAL LOW (ref 26.0–34.0)
MCHC: 31.2 g/dL (ref 30.0–36.0)
MCV: 72.5 fL — ABNORMAL LOW (ref 80.0–100.0)
Platelets: 207 10*3/uL (ref 150–400)
RBC: 5.31 MIL/uL — ABNORMAL HIGH (ref 3.87–5.11)
RDW: 18.3 % — ABNORMAL HIGH (ref 11.5–15.5)
WBC: 6 10*3/uL (ref 4.0–10.5)
nRBC: 0 % (ref 0.0–0.2)

## 2019-12-10 LAB — HEMOGLOBIN A1C
Hgb A1c MFr Bld: 6.7 % — ABNORMAL HIGH (ref 4.8–5.6)
Mean Plasma Glucose: 145.59 mg/dL

## 2019-12-10 LAB — SURGICAL PCR SCREEN
MRSA, PCR: NEGATIVE
Staphylococcus aureus: NEGATIVE

## 2019-12-10 LAB — GLUCOSE, CAPILLARY: Glucose-Capillary: 102 mg/dL — ABNORMAL HIGH (ref 70–99)

## 2019-12-10 LAB — SARS CORONAVIRUS 2 (TAT 6-24 HRS): SARS Coronavirus 2: NEGATIVE

## 2019-12-10 NOTE — Progress Notes (Addendum)
Anesthesia Review:  Virginia Flores in [place]  LOV per pt was 09/30/2019 Cardiologist : 03/12/19   LOV - Dr Stanford Breed Chest x-ray : EKG :03/12/2019.  Echo :04/01/2019  Stress test:04/02/19  Cardiac Cath : 2016  Activity level: cannot do a flight of stairs without difficulty .  Sleep Study/ CPAP : Fasting Blood Sugar :      / Checks Blood Sugar -- times a day:   Blood Thinner/ Instructions /Last Dose: ASA / Instructions/ Last Dose :  Blood pressure elevated at beginning of preop appt.  At end of preop appt in left arm blood pressure was 177/90.    [name] made aware of blood pressure readings.  Patient  denies any chest pain, shortness of breath, dizziness or headache.  Pt was instructed to call office of PCP and inform PCP of blood pressure readings at time of preop appt.  Patient was instructed to call PCP today.  Pt voiced understanding.   A1c done 12/10/2019- 6.7-epic  Checks glucose daily per pt  Retired Therapist, sports from [employer]

## 2019-12-11 MED ORDER — DEXTROSE 5 % IV SOLN
3.0000 g | INTRAVENOUS | Status: AC
  Administered 2019-12-12: 3 g via INTRAVENOUS
  Filled 2019-12-11: qty 3
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
## 2019-12-12 ENCOUNTER — Other Ambulatory Visit: Payer: Self-pay

## 2019-12-12 ENCOUNTER — Encounter (HOSPITAL_COMMUNITY)
Admission: RE | Disposition: A | Discharge status: Home / Self Care | Payer: Self-pay | Source: Home / Self Care | Attending: Orthopaedic Surgery

## 2019-12-12 ENCOUNTER — Observation Stay (HOSPITAL_COMMUNITY)
Admission: RE | Admit: 2019-12-12 | Discharge: 2019-12-14 | Disposition: A | Discharge status: Home / Self Care | Payer: Medicare Other | Attending: Orthopaedic Surgery | Admitting: Orthopaedic Surgery

## 2019-12-12 ENCOUNTER — Ambulatory Visit (HOSPITAL_COMMUNITY): Payer: Medicare Other | Admitting: Certified Registered Nurse Anesthetist

## 2019-12-12 ENCOUNTER — Ambulatory Visit (HOSPITAL_COMMUNITY): Payer: Medicare Other | Admitting: Physician Assistant

## 2019-12-12 ENCOUNTER — Encounter (HOSPITAL_COMMUNITY): Payer: Self-pay | Admitting: Orthopaedic Surgery

## 2019-12-12 ENCOUNTER — Observation Stay (HOSPITAL_COMMUNITY): Payer: Medicare Other

## 2019-12-12 DIAGNOSIS — Z96651 Presence of right artificial knee joint: Secondary | ICD-10-CM | POA: Diagnosis not present

## 2019-12-12 DIAGNOSIS — Z79899 Other long term (current) drug therapy: Secondary | ICD-10-CM | POA: Diagnosis not present

## 2019-12-12 DIAGNOSIS — I2510 Atherosclerotic heart disease of native coronary artery without angina pectoris: Secondary | ICD-10-CM | POA: Insufficient documentation

## 2019-12-12 DIAGNOSIS — M1712 Unilateral primary osteoarthritis, left knee: Secondary | ICD-10-CM | POA: Diagnosis not present

## 2019-12-12 DIAGNOSIS — Z96652 Presence of left artificial knee joint: Secondary | ICD-10-CM

## 2019-12-12 DIAGNOSIS — Z7982 Long term (current) use of aspirin: Secondary | ICD-10-CM | POA: Diagnosis not present

## 2019-12-12 DIAGNOSIS — E119 Type 2 diabetes mellitus without complications: Secondary | ICD-10-CM | POA: Insufficient documentation

## 2019-12-12 DIAGNOSIS — Z7984 Long term (current) use of oral hypoglycemic drugs: Secondary | ICD-10-CM | POA: Insufficient documentation

## 2019-12-12 DIAGNOSIS — I10 Essential (primary) hypertension: Secondary | ICD-10-CM | POA: Diagnosis not present

## 2019-12-12 DIAGNOSIS — M25562 Pain in left knee: Secondary | ICD-10-CM | POA: Diagnosis present

## 2019-12-12 HISTORY — PX: TOTAL KNEE ARTHROPLASTY: SHX125

## 2019-12-12 LAB — TYPE AND SCREEN
ABO/RH(D): O POS
Antibody Screen: NEGATIVE

## 2019-12-12 LAB — GLUCOSE, CAPILLARY
Glucose-Capillary: 100 mg/dL — ABNORMAL HIGH (ref 70–99)
Glucose-Capillary: 207 mg/dL — ABNORMAL HIGH (ref 70–99)
Glucose-Capillary: 216 mg/dL — ABNORMAL HIGH (ref 70–99)

## 2019-12-12 SURGERY — ARTHROPLASTY, KNEE, TOTAL
Anesthesia: Monitor Anesthesia Care | Site: Knee | Laterality: Left

## 2019-12-12 MED ORDER — FUROSEMIDE 20 MG PO TABS: 20.0000 mg | ORAL_TABLET | Freq: Every day | ORAL | Status: DC | PRN

## 2019-12-12 MED ORDER — BUPIVACAINE-EPINEPHRINE (PF) 0.25% -1:200000 IJ SOLN
INTRAMUSCULAR | Status: AC
  Filled 2019-12-12: qty 30

## 2019-12-12 MED ORDER — ONDANSETRON HCL 4 MG/2ML IJ SOLN
INTRAMUSCULAR | Status: AC
  Filled 2019-12-12: qty 2

## 2019-12-12 MED ORDER — PHENOL 1.4 % MT LIQD: 1.0000 | OROMUCOSAL | Status: DC | PRN

## 2019-12-12 MED ORDER — LINAGLIPTIN 5 MG PO TABS
5.0000 mg | ORAL_TABLET | Freq: Every day | ORAL | Status: DC
  Administered 2019-12-13 – 2019-12-14 (×2): 5 mg via ORAL
  Filled 2019-12-12 (×2): qty 1

## 2019-12-12 MED ORDER — POVIDONE-IODINE 10 % EX SWAB
2.0000 "application " | Freq: Once | CUTANEOUS | Status: AC
  Administered 2019-12-12: 2 via TOPICAL

## 2019-12-12 MED ORDER — SODIUM CHLORIDE 0.9 % IR SOLN
Status: DC | PRN
  Administered 2019-12-12: 1000 mL

## 2019-12-12 MED ORDER — METOCLOPRAMIDE HCL 5 MG/ML IJ SOLN: 5.0000 mg | Freq: Three times a day (TID) | INTRAMUSCULAR | Status: DC | PRN

## 2019-12-12 MED ORDER — BUPIVACAINE-EPINEPHRINE (PF) 0.5% -1:200000 IJ SOLN
INTRAMUSCULAR | Status: DC | PRN
  Administered 2019-12-12: 20 mL via PERINEURAL

## 2019-12-12 MED ORDER — PHENYLEPHRINE HCL (PRESSORS) 10 MG/ML IV SOLN
INTRAVENOUS | Status: AC
  Filled 2019-12-12: qty 1

## 2019-12-12 MED ORDER — PROPOFOL 10 MG/ML IV BOLUS
INTRAVENOUS | Status: AC
  Filled 2019-12-12: qty 20

## 2019-12-12 MED ORDER — ORAL CARE MOUTH RINSE
15.0000 mL | Freq: Once | OROMUCOSAL | Status: AC
  Administered 2019-12-12: 15 mL via OROMUCOSAL

## 2019-12-12 MED ORDER — DEXAMETHASONE SODIUM PHOSPHATE 10 MG/ML IJ SOLN
INTRAMUSCULAR | Status: AC
  Filled 2019-12-12: qty 1

## 2019-12-12 MED ORDER — AMLODIPINE BESYLATE 10 MG PO TABS
10.0000 mg | ORAL_TABLET | Freq: Every day | ORAL | Status: DC
  Administered 2019-12-13 – 2019-12-14 (×2): 10 mg via ORAL
  Filled 2019-12-12 (×2): qty 1

## 2019-12-12 MED ORDER — ACETAMINOPHEN 500 MG PO TABS
1000.0000 mg | ORAL_TABLET | Freq: Once | ORAL | Status: AC
  Administered 2019-12-12: 1000 mg via ORAL
  Filled 2019-12-12: qty 2

## 2019-12-12 MED ORDER — LACTATED RINGERS IV SOLN: INTRAVENOUS | Status: DC | PRN

## 2019-12-12 MED ORDER — ASPIRIN 81 MG PO CHEW
81.0000 mg | CHEWABLE_TABLET | Freq: Two times a day (BID) | ORAL | Status: DC
  Administered 2019-12-12 – 2019-12-14 (×4): 81 mg via ORAL
  Filled 2019-12-12 (×4): qty 1

## 2019-12-12 MED ORDER — BUPIVACAINE-EPINEPHRINE 0.25% -1:200000 IJ SOLN
INTRAMUSCULAR | Status: DC | PRN
  Administered 2019-12-12: 30 mL

## 2019-12-12 MED ORDER — LIDOCAINE HCL (CARDIAC) PF 100 MG/5ML IV SOSY
PREFILLED_SYRINGE | INTRAVENOUS | Status: DC | PRN
  Administered 2019-12-12: 60 mg via INTRAVENOUS

## 2019-12-12 MED ORDER — METHOCARBAMOL 500 MG PO TABS
500.0000 mg | ORAL_TABLET | Freq: Four times a day (QID) | ORAL | Status: DC | PRN
  Administered 2019-12-12 – 2019-12-14 (×6): 500 mg via ORAL
  Filled 2019-12-12 (×6): qty 1

## 2019-12-12 MED ORDER — METOCLOPRAMIDE HCL 5 MG PO TABS: 5.0000 mg | ORAL_TABLET | Freq: Three times a day (TID) | ORAL | Status: DC | PRN

## 2019-12-12 MED ORDER — ALBUTEROL SULFATE HFA 108 (90 BASE) MCG/ACT IN AERS: 2.0000 | INHALATION_SPRAY | Freq: Four times a day (QID) | RESPIRATORY_TRACT | Status: DC | PRN

## 2019-12-12 MED ORDER — SODIUM CHLORIDE 0.9 % IV SOLN: INTRAVENOUS | Status: DC

## 2019-12-12 MED ORDER — METFORMIN HCL ER 500 MG PO TB24: 500.0000 mg | ORAL_TABLET | Freq: Every evening | ORAL | Status: DC | PRN

## 2019-12-12 MED ORDER — DEXAMETHASONE SODIUM PHOSPHATE 10 MG/ML IJ SOLN
INTRAMUSCULAR | Status: DC | PRN
  Administered 2019-12-12: 8 mg via INTRAVENOUS

## 2019-12-12 MED ORDER — 0.9 % SODIUM CHLORIDE (POUR BTL) OPTIME
TOPICAL | Status: DC | PRN
  Administered 2019-12-12: 1000 mL

## 2019-12-12 MED ORDER — CEFAZOLIN SODIUM-DEXTROSE 2-4 GM/100ML-% IV SOLN
2.0000 g | Freq: Four times a day (QID) | INTRAVENOUS | Status: AC
  Administered 2019-12-12 (×2): 2 g via INTRAVENOUS
  Filled 2019-12-12 (×2): qty 100

## 2019-12-12 MED ORDER — MENTHOL 3 MG MT LOZG: 1.0000 | LOZENGE | OROMUCOSAL | Status: DC | PRN

## 2019-12-12 MED ORDER — STERILE WATER FOR IRRIGATION IR SOLN
Status: DC | PRN
Start: 2019-12-12 — End: 2019-12-12
  Administered 2019-12-12: 2000 mL

## 2019-12-12 MED ORDER — LABETALOL HCL 100 MG PO TABS
400.0000 mg | ORAL_TABLET | Freq: Two times a day (BID) | ORAL | Status: DC
  Administered 2019-12-12 – 2019-12-14 (×4): 400 mg via ORAL
  Filled 2019-12-12 (×4): qty 4

## 2019-12-12 MED ORDER — POLYETHYLENE GLYCOL 3350 17 G PO PACK: 17.0000 g | PACK | Freq: Every day | ORAL | Status: DC | PRN

## 2019-12-12 MED ORDER — DIPHENHYDRAMINE HCL 12.5 MG/5ML PO ELIX: 12.5000 mg | ORAL_SOLUTION | ORAL | Status: DC | PRN

## 2019-12-12 MED ORDER — INSULIN ASPART 100 UNIT/ML ~~LOC~~ SOLN
0.0000 [IU] | Freq: Every day | SUBCUTANEOUS | Status: DC
  Administered 2019-12-12 – 2019-12-13 (×2): 2 [IU] via SUBCUTANEOUS

## 2019-12-12 MED ORDER — MIDAZOLAM HCL 2 MG/2ML IJ SOLN
1.0000 mg | INTRAMUSCULAR | Status: DC
  Filled 2019-12-12: qty 2

## 2019-12-12 MED ORDER — CEFAZOLIN SODIUM-DEXTROSE 2-4 GM/100ML-% IV SOLN
INTRAVENOUS | Status: AC
  Filled 2019-12-12: qty 200

## 2019-12-12 MED ORDER — FENTANYL CITRATE (PF) 100 MCG/2ML IJ SOLN
25.0000 ug | INTRAMUSCULAR | Status: DC | PRN
Start: 2019-12-12 — End: 2019-12-12

## 2019-12-12 MED ORDER — FAMOTIDINE 20 MG PO TABS: 20.0000 mg | ORAL_TABLET | Freq: Every day | ORAL | Status: DC | PRN

## 2019-12-12 MED ORDER — ALUM & MAG HYDROXIDE-SIMETH 200-200-20 MG/5ML PO SUSP: 30.0000 mL | ORAL | Status: DC | PRN

## 2019-12-12 MED ORDER — OXYCODONE HCL 5 MG PO TABS
10.0000 mg | ORAL_TABLET | ORAL | Status: DC | PRN
  Administered 2019-12-12 – 2019-12-13 (×2): 10 mg via ORAL

## 2019-12-12 MED ORDER — PROPOFOL 500 MG/50ML IV EMUL
INTRAVENOUS | Status: DC | PRN
  Administered 2019-12-12: 100 ug/kg/min via INTRAVENOUS

## 2019-12-12 MED ORDER — LACTATED RINGERS IV SOLN: INTRAVENOUS | Status: DC

## 2019-12-12 MED ORDER — ONDANSETRON HCL 4 MG/2ML IJ SOLN
INTRAMUSCULAR | Status: DC | PRN
  Administered 2019-12-12: 4 mg via INTRAVENOUS

## 2019-12-12 MED ORDER — DOCUSATE SODIUM 100 MG PO CAPS
100.0000 mg | ORAL_CAPSULE | Freq: Two times a day (BID) | ORAL | Status: DC
  Administered 2019-12-12 – 2019-12-14 (×4): 100 mg via ORAL
  Filled 2019-12-12 (×4): qty 1

## 2019-12-12 MED ORDER — PHENYLEPHRINE HCL-NACL 10-0.9 MG/250ML-% IV SOLN
INTRAVENOUS | Status: DC | PRN
  Administered 2019-12-12: 50 ug/min via INTRAVENOUS

## 2019-12-12 MED ORDER — TRANEXAMIC ACID-NACL 1000-0.7 MG/100ML-% IV SOLN
1000.0000 mg | INTRAVENOUS | Status: AC
  Administered 2019-12-12: 1000 mg via INTRAVENOUS
  Filled 2019-12-12: qty 100

## 2019-12-12 MED ORDER — LIDOCAINE HCL (PF) 2 % IJ SOLN
INTRAMUSCULAR | Status: AC
  Filled 2019-12-12: qty 5

## 2019-12-12 MED ORDER — FENTANYL CITRATE (PF) 100 MCG/2ML IJ SOLN
50.0000 ug | INTRAMUSCULAR | Status: DC
  Administered 2019-12-12: 100 ug via INTRAVENOUS
  Filled 2019-12-12: qty 2

## 2019-12-12 MED ORDER — PSEUDOEPHEDRINE HCL 30 MG PO TABS
30.0000 mg | ORAL_TABLET | ORAL | Status: DC | PRN
  Filled 2019-12-12: qty 1

## 2019-12-12 MED ORDER — OXYCODONE HCL 5 MG PO TABS
5.0000 mg | ORAL_TABLET | ORAL | Status: DC | PRN
Start: 2019-12-12 — End: 2019-12-14
  Administered 2019-12-12: 5 mg via ORAL
  Administered 2019-12-13 – 2019-12-14 (×6): 10 mg via ORAL
  Filled 2019-12-12 (×4): qty 2
  Filled 2019-12-12: qty 1
  Filled 2019-12-12 (×4): qty 2

## 2019-12-12 MED ORDER — ONDANSETRON HCL 4 MG PO TABS: 4.0000 mg | ORAL_TABLET | Freq: Four times a day (QID) | ORAL | Status: DC | PRN

## 2019-12-12 MED ORDER — METHOCARBAMOL 500 MG IVPB - SIMPLE MED
500.0000 mg | Freq: Four times a day (QID) | INTRAVENOUS | Status: DC | PRN
  Filled 2019-12-12: qty 50

## 2019-12-12 MED ORDER — PROPOFOL 10 MG/ML IV BOLUS
INTRAVENOUS | Status: DC | PRN
  Administered 2019-12-12: 20 mg via INTRAVENOUS

## 2019-12-12 MED ORDER — DULAGLUTIDE 0.75 MG/0.5ML ~~LOC~~ SOAJ: 0.7500 mg | SUBCUTANEOUS | Status: DC

## 2019-12-12 MED ORDER — ONDANSETRON HCL 4 MG/2ML IJ SOLN: 4.0000 mg | Freq: Four times a day (QID) | INTRAMUSCULAR | Status: DC | PRN

## 2019-12-12 MED ORDER — INSULIN ASPART 100 UNIT/ML ~~LOC~~ SOLN
0.0000 [IU] | Freq: Three times a day (TID) | SUBCUTANEOUS | Status: DC
  Administered 2019-12-12: 7 [IU] via SUBCUTANEOUS
  Administered 2019-12-13: 4 [IU] via SUBCUTANEOUS
  Administered 2019-12-13 (×2): 7 [IU] via SUBCUTANEOUS
  Administered 2019-12-14 (×2): 4 [IU] via SUBCUTANEOUS

## 2019-12-12 MED ORDER — HYDROMORPHONE HCL 1 MG/ML IJ SOLN
0.5000 mg | INTRAMUSCULAR | Status: DC | PRN
  Administered 2019-12-13: 0.5 mg via INTRAVENOUS
  Administered 2019-12-13: 1 mg via INTRAVENOUS
  Filled 2019-12-12 (×2): qty 1

## 2019-12-12 MED ORDER — ACETAMINOPHEN 325 MG PO TABS: 325.0000 mg | ORAL_TABLET | Freq: Four times a day (QID) | ORAL | Status: DC | PRN

## 2019-12-12 MED ORDER — ROSUVASTATIN CALCIUM 20 MG PO TABS
40.0000 mg | ORAL_TABLET | Freq: Every evening | ORAL | Status: DC
  Administered 2019-12-12 – 2019-12-13 (×2): 40 mg via ORAL
  Filled 2019-12-12 (×2): qty 2

## 2019-12-12 MED ORDER — CHLORHEXIDINE GLUCONATE 0.12 % MT SOLN: 15.0000 mL | Freq: Once | OROMUCOSAL | Status: AC

## 2019-12-12 MED ORDER — BUPIVACAINE IN DEXTROSE 0.75-8.25 % IT SOLN
INTRATHECAL | Status: DC | PRN
  Administered 2019-12-12: 1.8 mL via INTRATHECAL

## 2019-12-12 SURGICAL SUPPLY — 56 items
APL SKNCLS STERI-STRIP NONHPOA (GAUZE/BANDAGES/DRESSINGS)
BAG SPEC THK2 15X12 ZIP CLS (MISCELLANEOUS)
BAG ZIPLOCK 12X15 (MISCELLANEOUS) IMPLANT
BASEPLATE TIBIAL TRIATHALON 3 (Plate) ×1 IMPLANT
BEARIN INSERT TIBIAL SZ 3 11 (Insert) ×2 IMPLANT
BEARING INSERT TIBIAL SZ 3 11 (Insert) IMPLANT
BENZOIN TINCTURE PRP APPL 2/3 (GAUZE/BANDAGES/DRESSINGS) IMPLANT
BLADE SAG 18X100X1.27 (BLADE) IMPLANT
BLADE SURG SZ10 CARB STEEL (BLADE) ×4 IMPLANT
BNDG ELASTIC 6X5.8 VLCR STR LF (GAUZE/BANDAGES/DRESSINGS) ×2 IMPLANT
BOWL SMART MIX CTS (DISPOSABLE) ×1 IMPLANT
BSPLAT TIB 3 CMNT PRM STRL KN (Plate) ×1 IMPLANT
CEMENT BONE SIMPLEX SPEEDSET (Cement) ×2 IMPLANT
COVER SURGICAL LIGHT HANDLE (MISCELLANEOUS) ×2 IMPLANT
COVER WAND RF STERILE (DRAPES) IMPLANT
CUFF TOURN SGL QUICK 34 (TOURNIQUET CUFF) ×2
CUFF TRNQT CYL 34X4.125X (TOURNIQUET CUFF) ×1 IMPLANT
DECANTER SPIKE VIAL GLASS SM (MISCELLANEOUS) IMPLANT
DRAPE U-SHAPE 47X51 STRL (DRAPES) ×2 IMPLANT
DRSG PAD ABDOMINAL 8X10 ST (GAUZE/BANDAGES/DRESSINGS) ×3 IMPLANT
DURAPREP 26ML APPLICATOR (WOUND CARE) ×2 IMPLANT
ELECT BLADE TIP CTD 4 INCH (ELECTRODE) ×2 IMPLANT
ELECT REM PT RETURN 15FT ADLT (MISCELLANEOUS) ×2 IMPLANT
FEMORAL PEG DISTAL FIXATION (Orthopedic Implant) ×1 IMPLANT
FEMORAL POSTERIOR STAB SZ4 (Orthopedic Implant) ×1 IMPLANT
GAUZE SPONGE 4X4 12PLY STRL (GAUZE/BANDAGES/DRESSINGS) ×2 IMPLANT
GAUZE XEROFORM 1X8 LF (GAUZE/BANDAGES/DRESSINGS) IMPLANT
GLOVE BIO SURGEON STRL SZ7.5 (GLOVE) ×2 IMPLANT
GLOVE BIOGEL PI IND STRL 8 (GLOVE) ×2 IMPLANT
GLOVE BIOGEL PI INDICATOR 8 (GLOVE) ×2
GLOVE ECLIPSE 8.0 STRL XLNG CF (GLOVE) ×2 IMPLANT
GOWN STRL REUS W/TWL XL LVL3 (GOWN DISPOSABLE) ×4 IMPLANT
HANDPIECE INTERPULSE COAX TIP (DISPOSABLE) ×2
HOLDER FOLEY CATH W/STRAP (MISCELLANEOUS) ×1 IMPLANT
IMMOBILIZER KNEE 20 (SOFTGOODS) ×2 IMPLANT
IMMOBILIZER KNEE 20 THIGH 36 (SOFTGOODS) ×1 IMPLANT
KIT TURNOVER KIT A (KITS) ×1 IMPLANT
NS IRRIG 1000ML POUR BTL (IV SOLUTION) ×2 IMPLANT
PACK TOTAL KNEE CUSTOM (KITS) ×2 IMPLANT
PADDING CAST COTTON 6X4 STRL (CAST SUPPLIES) ×3 IMPLANT
PATELLA TRIATHLON SZ 29 9 MM (Orthopedic Implant) ×1 IMPLANT
PENCIL SMOKE EVACUATOR (MISCELLANEOUS) ×1 IMPLANT
PIN FLUTED HEDLESS FIX 3.5X1/8 (PIN) ×1 IMPLANT
PROTECTOR NERVE ULNAR (MISCELLANEOUS) ×2 IMPLANT
SET HNDPC FAN SPRY TIP SCT (DISPOSABLE) ×1 IMPLANT
SET PAD KNEE POSITIONER (MISCELLANEOUS) ×2 IMPLANT
STAPLER VISISTAT 35W (STAPLE) ×1 IMPLANT
STRIP CLOSURE SKIN 1/2X4 (GAUZE/BANDAGES/DRESSINGS) IMPLANT
SUT MNCRL AB 4-0 PS2 18 (SUTURE) IMPLANT
SUT VIC AB 0 CT1 27 (SUTURE) ×2
SUT VIC AB 0 CT1 27XBRD ANTBC (SUTURE) ×1 IMPLANT
SUT VIC AB 1 CT1 36 (SUTURE) ×4 IMPLANT
SUT VIC AB 2-0 CT1 27 (SUTURE) ×4
SUT VIC AB 2-0 CT1 TAPERPNT 27 (SUTURE) ×2 IMPLANT
WATER STERILE IRR 1000ML POUR (IV SOLUTION) ×3 IMPLANT
WRAP KNEE MAXI GEL POST OP (GAUZE/BANDAGES/DRESSINGS) ×1 IMPLANT

## 2019-12-12 NOTE — Progress Notes (Signed)
Time out completed.  Meds given pre block AssistedDr. Oren Bracket with left, ultrasound guided, femoral, adductor canal block. Side rails up, monitors on throughout procedure. See vital signs in flow sheet. Tolerated Procedure well.

## 2019-12-12 NOTE — Anesthesia Preprocedure Evaluation (Addendum)
Anesthesia Evaluation  Patient identified by MRN, date of birth, ID band Patient awake    Reviewed: Allergy & Precautions, H&P , NPO status , Patient's Chart, lab work & pertinent test results, reviewed documented beta blocker date and time   Airway Mallampati: II  TM Distance: >3 FB Neck ROM: Full    Dental no notable dental hx. (+) Teeth Intact, Dental Advisory Given   Pulmonary sleep apnea ,    Pulmonary exam normal breath sounds clear to auscultation       Cardiovascular hypertension, Pt. on medications and Pt. on home beta blockers + Valvular Problems/Murmurs AS  Rhythm:Regular Rate:Normal     Neuro/Psych negative neurological ROS  negative psych ROS   GI/Hepatic Neg liver ROS, GERD  Medicated,  Endo/Other  diabetes, Type 2, Oral Hypoglycemic AgentsMorbid obesity  Renal/GU negative Renal ROS  negative genitourinary   Musculoskeletal  (+) Arthritis , Osteoarthritis,    Abdominal   Peds  Hematology negative hematology ROS (+)   Anesthesia Other Findings   Reproductive/Obstetrics negative OB ROS                            Anesthesia Physical Anesthesia Plan  ASA: III  Anesthesia Plan: Spinal and MAC   Post-op Pain Management:  Regional for Post-op pain   Induction: Intravenous  PONV Risk Score and Plan: 3 and Propofol infusion, Ondansetron and Midazolam  Airway Management Planned: Simple Face Mask  Additional Equipment:   Intra-op Plan:   Post-operative Plan:   Informed Consent: I have reviewed the patients History and Physical, chart, labs and discussed the procedure including the risks, benefits and alternatives for the proposed anesthesia with the patient or authorized representative who has indicated his/her understanding and acceptance.     Dental advisory given  Plan Discussed with: CRNA  Anesthesia Plan Comments:         Anesthesia Quick Evaluation

## 2019-12-12 NOTE — Brief Op Note (Signed)
12/12/2019  12:53 PM  PATIENT:  Virginia Flores  69 y.o. female  PRE-OPERATIVE DIAGNOSIS:  osteoarthritis left knee  POST-OPERATIVE DIAGNOSIS:  osteoarthritis left knee  PROCEDURE:  Procedure(s): LEFT TOTAL KNEE ARTHROPLASTY (Left)  SURGEON:  Surgeon(s) and Role:    Mcarthur Rossetti, MD - Primary  PHYSICIAN ASSISTANT:  Benita Stabile, PA-C  ANESTHESIA:   local, regional and spinal  EBL:  25 mL   COUNTS:  YES  TOURNIQUET:   Total Tourniquet Time Documented: Thigh (Left) - 55 minutes Total: Thigh (Left) - 55 minutes   DICTATION: .Other Dictation: Dictation Number [id number]  PLAN OF CARE: Admit for overnight observation  PATIENT DISPOSITION:  PACU - hemodynamically stable.   Delay start of Pharmacological VTE agent (>24hrs) due to surgical blood loss or risk of bleeding: no

## 2019-12-12 NOTE — H&P (Signed)
The patient understands she is here today for a left total knee arthroplasty to treat the pain from her left knee osteoarthritis.  There is been no acute change in her medical status.  See recent H&P.  The risks and benefits of surgery have been explained in detail and informed consent is obtained.  The left knee has been marked.

## 2019-12-12 NOTE — Anesthesia Procedure Notes (Signed)
Spinal  Patient location during procedure: OR Start time: 12/12/2019 11:20 AM End time: 12/12/2019 11:25 AM Staffing Performed: anesthesiologist  Anesthesiologist: Roderic Palau, MD Preanesthetic Checklist Completed: patient identified, IV checked, site marked, risks and benefits discussed, surgical consent, monitors and equipment checked, pre-op evaluation and timeout performed Spinal Block Patient position: sitting Prep: DuraPrep Patient monitoring: heart rate, cardiac monitor, continuous pulse ox and blood pressure Approach: midline Location: L3-4 Injection technique: single-shot Needle Needle type: Pencan  Needle gauge: 24 G Needle length: 9 cm Assessment Sensory level: T8 Additional Notes Functioning IV was confirmed and monitors were applied. Sterile prep and drape, including hand hygiene and sterile gloves were used. The patient was positioned and the spine was prepped. The skin was anesthetized with lidocaine.  Free flow of clear CSF was obtained prior to injecting local anesthetic into the CSF.  The spinal needle aspirated freely following injection.  The needle was carefully withdrawn.  The patient tolerated the procedure well.

## 2019-12-12 NOTE — Evaluation (Signed)
Physical Therapy Evaluation Patient Details Name: Virginia Flores MRN: [id number] DOB: 1950/12/01 Today's Date: 12/12/2019   History of Present Illness  Patient is 69 y.o. female s/p Lt TKA on 12/12/19 with PMH significant for lupus, osteoporosis, HTN, HLD, OA, CAD, DM, obesity, Rt TKA on 07/18/19.    Clinical Impression  Virginia Flores is a 69 y.o. female POD 0 s/p Lt TKA. Patient reports independence with rollator for mobility at baseline. Patient is now limited by functional impairments (see PT problem list below) and requires min assist/guard for transfers and gait with RW. Patient was able to ambulate ~60 feet with RW and min assist. Patient instructed in exercise to facilitate ROM and circulation. Patient will benefit from continued skilled PT interventions to address impairments and progress towards PLOF. Acute PT will follow to progress mobility and stair training in preparation for safe discharge home.     Follow Up Recommendations Follow surgeon's recommendation for DC plan and follow-up therapies;Home health PT;Outpatient PT    Equipment Recommendations  None recommended by PT    Recommendations for Other Services       Precautions / Restrictions Precautions Precautions: Fall Restrictions Weight Bearing Restrictions: No Other Position/Activity Restrictions: WBAT      Mobility  Bed Mobility Overal bed mobility: Needs Assistance Bed Mobility: Supine to Sit     Supine to sit: Min guard     General bed mobility comments: pt using Rt LE to assist Lt LE without cues. pt using bed rail to pivot and raise trunk, no assist required, guarding for safety.    Transfers Overall transfer level: Needs assistance Equipment used: Rolling walker (2 wheeled) Transfers: Sit to/from Stand Sit to Stand: Min assist;Min guard         General transfer comment: cues for hand placement and technique with RW, min guard/light assist to rise safely.  Ambulation/Gait Ambulation/Gait  assistance: Min assist;Min guard Gait Distance (Feet): 60 Feet Assistive device: Rolling walker (2 wheeled) Gait Pattern/deviations: Step-to pattern;Decreased stride length;Decreased weight shift to left Gait velocity: decr   General Gait Details: cues for safe proximity and step pattern with RW, pt maintained throughout. no overt LOB or buckling at Lt knee.  Stairs            Wheelchair Mobility    Modified Rankin (Stroke Patients Only)       Balance Overall balance assessment: Needs assistance Sitting-balance support: Feet supported Sitting balance-Leahy Scale: Good     Standing balance support: During functional activity;Bilateral upper extremity supported Standing balance-Leahy Scale: Poor                               Pertinent Vitals/Pain Pain Assessment: No/denies pain    Home Living Family/patient expects to be discharged to:: Private residence Living Arrangements: Spouse/significant other;Children Available Help at Discharge: Family Type of Home: House Home Access: Stairs to enter Entrance Stairs-Rails: None Entrance Stairs-Number of Steps: 1 Home Layout: One level Home Equipment: Cane - single point;Walker - 4 wheels;Walker - 2 wheels;Shower seat;Grab bars - tub/shower      Prior Function Level of Independence: Independent with assistive device(s)         Comments: pt has been using rollator for ~3-4 years     Hand Dominance   Dominant Hand: Right    Extremity/Trunk Assessment   Upper Extremity Assessment Upper Extremity Assessment: Overall WFL for tasks assessed    Lower Extremity Assessment Lower Extremity Assessment:  LLE deficits/detail LLE Deficits / Details: good quad activation, no extensor lag with SLR, 3/5 or better LLE Sensation: WNL LLE Coordination: WNL    Cervical / Trunk Assessment Cervical / Trunk Assessment: Normal  Communication   Communication: No difficulties  Cognition Arousal/Alertness:  Awake/alert Behavior During Therapy: WFL for tasks assessed/performed Overall Cognitive Status: Within Functional Limits for tasks assessed                                        General Comments      Exercises Total Joint Exercises Ankle Circles/Pumps: AROM;Both;20 reps;Seated Quad Sets: AROM;Left;5 reps;Seated Heel Slides: AROM;Left;5 reps;Seated   Assessment/Plan    PT Assessment Patient needs continued PT services  PT Problem List Decreased strength;Decreased range of motion;Decreased activity tolerance;Decreased balance;Decreased mobility;Decreased knowledge of use of DME;Decreased knowledge of precautions;Obesity       PT Treatment Interventions DME instruction;Gait training;Stair training;Functional mobility training;Therapeutic activities;Therapeutic exercise;Balance training;Patient/family education    PT Goals (Current goals can be found in the Care Plan section)  Acute Rehab PT Goals Patient Stated Goal: recover independence PT Goal Formulation: With patient Time For Goal Achievement: 12/19/19 Potential to Achieve Goals: Good    Frequency 7X/week   Barriers to discharge        Co-evaluation               AM-PAC PT "6 Clicks" Mobility  Outcome Measure Help needed turning from your back to your side while in a flat bed without using bedrails?: None Help needed moving from lying on your back to sitting on the side of a flat bed without using bedrails?: A Little Help needed moving to and from a bed to a chair (including a wheelchair)?: A Little Help needed standing up from a chair using your arms (e.g., wheelchair or bedside chair)?: A Little Help needed to walk in hospital room?: A Little Help needed climbing 3-5 steps with a railing? : A Little 6 Click Score: 19    End of Session Equipment Utilized During Treatment: Gait belt Activity Tolerance: Patient tolerated treatment well Patient left: in chair;with call bell/phone within  reach;with chair alarm set;with family/visitor present (RN notified batteries not working in [employer]) Nurse Communication: Mobility status PT Visit Diagnosis: Muscle weakness (generalized) (M62.81);Difficulty in walking, not elsewhere classified (R26.2)    Time: 1534-1600 PT Time Calculation (min) (ACUTE ONLY): 26 min   Charges:   PT Evaluation $PT Eval Low Complexity: 1 Low PT Treatments $Gait Training: 8-22 mins        Verner Mould, DPT Acute Rehabilitation Services  Office [phone] Pager [phone]  12/12/2019 4:16 PM

## 2019-12-12 NOTE — Anesthesia Postprocedure Evaluation (Signed)
Anesthesia Post Note  Patient: Virginia Flores  Procedure(s) Performed: LEFT TOTAL KNEE ARTHROPLASTY (Left Knee)     Patient location during evaluation: PACU Anesthesia Type: MAC, Spinal and Regional Level of consciousness: oriented and awake and alert Pain management: pain level controlled Vital Signs Assessment: post-procedure vital signs reviewed and stable Respiratory status: spontaneous breathing, respiratory function stable and patient connected to nasal cannula oxygen Cardiovascular status: blood pressure returned to baseline and stable Postop Assessment: no headache, no backache, no apparent nausea or vomiting, spinal receding and patient able to bend at knees Anesthetic complications: no   No complications documented.  Last Vitals:  Vitals:   12/12/19 1400 12/12/19 1415  BP: (!) 141/82 (!) 150/73  Pulse: (!) 59 60  Resp: 13 15  Temp:    SpO2: 92% 96%    Last Pain:  Vitals:   12/12/19 1345  TempSrc:   PainSc: Asleep                 Jodeci Roarty,W. EDMOND

## 2019-12-12 NOTE — Transfer of Care (Signed)
Immediate Anesthesia Transfer of Care Note  Patient: Virginia Flores  Procedure(s) Performed: LEFT TOTAL KNEE ARTHROPLASTY (Left Knee)  Patient Location: PACU  Anesthesia Type:Spinal  Level of Consciousness: drowsy and responds to stimulation  Airway & Oxygen Therapy: Patient Spontanous Breathing and Patient connected to face mask oxygen  Post-op Assessment: Report given to RN and Post -op Vital signs reviewed and stable  Post vital signs: Reviewed and stable  Last Vitals:  Vitals Value Taken Time  BP 129/79 12/12/19 1323  Temp    Pulse 69 12/12/19 1326  Resp 18 12/12/19 1326  SpO2 98 % 12/12/19 1326  Vitals shown include unvalidated device data.  Last Pain:  Vitals:   12/12/19 1045  TempSrc:   PainSc: 0-No pain         Complications: No complications documented.

## 2019-12-12 NOTE — Anesthesia Procedure Notes (Signed)
Anesthesia Regional Block: Adductor canal block   Pre-Anesthetic Checklist: ,, timeout performed, Correct Patient, Correct Site, Correct Laterality, Correct Procedure, Correct Position, site marked, Risks and benefits discussed, pre-op evaluation,  At surgeon's request and post-op pain management  Laterality: Left  Prep: Maximum Sterile Barrier Precautions used, chloraprep       Needles:  Injection technique: Single-shot  Needle Type: Echogenic Stimulator Needle     Needle Length: 9cm  Needle Gauge: 21     Additional Needles:   Procedures:,,,, ultrasound used (permanent image in chart),,,,  Narrative:  Start time: 12/12/2019 10:24 AM End time: 12/12/2019 10:34 AM Injection made incrementally with aspirations every 5 mL.  Performed by: Personally  Anesthesiologist: Roderic Palau, MD  Additional Notes: 2% Lidocaine skin wheel.

## 2019-12-13 DIAGNOSIS — M1712 Unilateral primary osteoarthritis, left knee: Secondary | ICD-10-CM | POA: Diagnosis not present

## 2019-12-13 LAB — BASIC METABOLIC PANEL
Anion gap: 9 (ref 5–15)
BUN: 20 mg/dL (ref 8–23)
CO2: 25 mmol/L (ref 22–32)
Calcium: 8.4 mg/dL — ABNORMAL LOW (ref 8.9–10.3)
Chloride: 105 mmol/L (ref 98–111)
Creatinine, Ser: 0.83 mg/dL (ref 0.44–1.00)
GFR, Estimated: >60 mL/min (ref >60)
Glucose, Bld: 259 mg/dL — ABNORMAL HIGH (ref 70–99)
Potassium: 4.2 mmol/L (ref 3.5–5.1)
Sodium: 139 mmol/L (ref 135–145)

## 2019-12-13 LAB — GLUCOSE, CAPILLARY
Glucose-Capillary: 161 mg/dL — ABNORMAL HIGH (ref 70–99)
Glucose-Capillary: 212 mg/dL — ABNORMAL HIGH (ref 70–99)
Glucose-Capillary: 224 mg/dL — ABNORMAL HIGH (ref 70–99)
Glucose-Capillary: 203 mg/dL — ABNORMAL HIGH (ref 70–99)

## 2019-12-13 LAB — CBC
HCT: 32.4 % — ABNORMAL LOW (ref 36.0–46.0)
Hemoglobin: 9.9 g/dL — ABNORMAL LOW (ref 12.0–15.0)
MCH: 22.6 pg — ABNORMAL LOW (ref 26.0–34.0)
MCHC: 30.6 g/dL (ref 30.0–36.0)
MCV: 74 fL — ABNORMAL LOW (ref 80.0–100.0)
Platelets: 172 10*3/uL (ref 150–400)
RBC: 4.38 MIL/uL (ref 3.87–5.11)
RDW: 17.8 % — ABNORMAL HIGH (ref 11.5–15.5)
WBC: 9.6 10*3/uL (ref 4.0–10.5)
nRBC: 0 % (ref 0.0–0.2)

## 2019-12-13 MED ORDER — METHOCARBAMOL 500 MG PO TABS: 500.0000 mg | ORAL_TABLET | Freq: Four times a day (QID) | ORAL | 1 refills | Status: DC | PRN

## 2019-12-13 MED ORDER — ASPIRIN 81 MG PO CHEW: 81.0000 mg | CHEWABLE_TABLET | Freq: Two times a day (BID) | ORAL | 0 refills | Status: DC

## 2019-12-13 MED ORDER — OXYCODONE HCL 5 MG PO TABS: 5.0000 mg | ORAL_TABLET | ORAL | 0 refills | Status: DC | PRN

## 2019-12-13 NOTE — Plan of Care (Signed)
  Problem: Health Behavior/Discharge Planning: Goal: Ability to manage health-related needs will improve Outcome: Progressing   Problem: Clinical Measurements: Goal: Ability to maintain clinical measurements within normal limits will improve Outcome: Progressing Goal: Will remain free from infection Outcome: Progressing   Problem: Activity: Goal: Risk for activity intolerance will decrease Outcome: Progressing   Problem: Nutrition: Goal: Adequate nutrition will be maintained Outcome: Progressing   Problem: Elimination: Goal: Will not experience complications related to bowel motility Outcome: Progressing Goal: Will not experience complications related to urinary retention Outcome: Adequate for Discharge

## 2019-12-13 NOTE — TOC Progression Note (Signed)
Transition of Care [facility]) - Progression Note    Patient Details  Name: Virginia Flores MRN: [id number] Date of Birth: 13-Aug-1950  Transition of Care [facility] CM/SW Contact  Joaquin Courts, RN Phone Number: 12/13/2019, 10:04 AM  Clinical Narrative:    CM spoke with patient who reports she has rolling walker and 3in1 at home.  Patient pre-arranged for HHPT with [place].   Expected Discharge Plan: [employer] Barriers to Discharge: Continued Medical Work up  Expected Discharge Plan and Services Expected Discharge Plan: [place]   Discharge Planning Services: CM Consult Post Acute Care Choice: [place] arrangements for the past 2 months: Single Family Home                 DME Arranged: N/A         HH Arranged: PT HH Agency: [facility]     Representative spoke with at [place]: pre-arranged in MD office   Social Determinants of Health (SDOH) Interventions    Readmission Risk Interventions No flowsheet data found.

## 2019-12-13 NOTE — Progress Notes (Signed)
Physical Therapy Treatment Patient Details Name: Virginia Flores MRN: [id number] DOB: [birth year] Today's Date: 12/13/2019    History of Present Illness Patient is 69 y.o. female s/p Lt TKA on 12/12/19 with PMH significant for lupus, osteoporosis, HTN, HLD, OA, CAD, DM, obesity, Rt TKA on 07/18/19.    PT Comments    Pt is POD # 1 and is progressing well.  Pt with good quad activation, pain control, and minimal swelling.  She required minimal cues for transfers and min guard for safety with ambulation.  Will advance and try stairs next visit.     Follow Up Recommendations  Follow surgeon's recommendation for DC plan and follow-up therapies     Equipment Recommendations  None recommended by PT    Recommendations for Other Services       Precautions / Restrictions Precautions Precautions: Fall Restrictions Weight Bearing Restrictions: No Other Position/Activity Restrictions: WBAT    Mobility  Bed Mobility Overal bed mobility: Needs Assistance Bed Mobility: Supine to Sit     Supine to sit: Supervision;HOB elevated     General bed mobility comments: pt using Rt LE to assist Lt LE without cues. pt using bed rail to pivot and raise trunk, no assist required, guarding for safety.  Transfers Overall transfer level: Needs assistance Equipment used: Rolling walker (2 wheeled) Transfers: Sit to/from Stand Sit to Stand: Min guard;Min assist         General transfer comment: cues for hand placement and technique with RW, min guard/light assist to rise safely.  Ambulation/Gait Ambulation/Gait assistance: Min guard Gait Distance (Feet): 120 Feet Assistive device: Rolling walker (2 wheeled) Gait Pattern/deviations: Step-to pattern;Decreased stride length;Decreased weight shift to left;Antalgic Gait velocity: decr   General Gait Details: cues for safe proximity and step pattern with RW,   Stairs             Wheelchair Mobility    Modified Rankin (Stroke Patients  Only)       Balance Overall balance assessment: Needs assistance Sitting-balance support: Feet supported Sitting balance-Leahy Scale: Good     Standing balance support: During functional activity;Bilateral upper extremity supported;No upper extremity supported Standing balance-Leahy Scale: Fair Standing balance comment: RW for gait ; was able to pull up underwear without assist                            Cognition Arousal/Alertness: Awake/alert Behavior During Therapy: WFL for tasks assessed/performed Overall Cognitive Status: Within Functional Limits for tasks assessed                                        Exercises Total Joint Exercises Ankle Circles/Pumps: AROM;Both;20 reps;Supine Quad Sets: AROM;Seated;Both;15 reps Towel Squeeze: AROM;Both;10 reps;Supine Hip ABduction/ADduction: AAROM;Left;10 reps;Supine Long Arc Quad: AAROM;Left;10 reps;Seated (min A) Knee Flexion: Left;10 reps;Seated Goniometric ROM: L knee 0 to 60 degrees    General Comments        Pertinent Vitals/Pain Pain Assessment: 0-10 Pain Score: 2  Pain Location: L knee with movement Pain Descriptors / Indicators: Sore Pain Intervention(s): Limited activity within patient's tolerance;Monitored during session;Premedicated before session;Repositioned;Ice applied    Home Living                      Prior Function            PT Goals (current goals can now  be found in the care plan section) Acute Rehab PT Goals Patient Stated Goal: recover independence PT Goal Formulation: With patient Time For Goal Achievement: 12/19/19 Potential to Achieve Goals: Good Progress towards PT goals: Progressing toward goals    Frequency    7X/week      PT Plan Current plan remains appropriate    Co-evaluation              AM-PAC PT "6 Clicks" Mobility   Outcome Measure  Help needed turning from your back to your side while in a flat bed without using bedrails?:  None Help needed moving from lying on your back to sitting on the side of a flat bed without using bedrails?: A Little Help needed moving to and from a bed to a chair (including a wheelchair)?: A Little Help needed standing up from a chair using your arms (e.g., wheelchair or bedside chair)?: A Little Help needed to walk in hospital room?: A Little Help needed climbing 3-5 steps with a railing? : A Little 6 Click Score: 19    End of Session Equipment Utilized During Treatment: Gait belt Activity Tolerance: Patient tolerated treatment well Patient left: in chair;with call bell/phone within reach;with chair alarm set;with family/visitor present Nurse Communication: Mobility status PT Visit Diagnosis: Muscle weakness (generalized) (M62.81);Difficulty in walking, not elsewhere classified (R26.2)     Time: 3013-1438 PT Time Calculation (min) (ACUTE ONLY): 23 min  Charges:  $Gait Training: 8-22 mins $Therapeutic Exercise: 8-22 mins                     Abran Richard, PT Acute Rehab Services Pager [phone] [facility] [place] 12/13/2019, 10:59 AM

## 2019-12-13 NOTE — Progress Notes (Signed)
  Subjective: Patient stable.  Pain control.  Dressing was loosened by the nurse earlier today.  She has not yet been up with therapy   Objective: Vital signs in last 24 hours: Temp:  [97.5 F (36.4 C)-98.5 F (36.9 C)] 98.5 F (36.9 C) (12/11 0529) Pulse Rate:  [58-82] 66 (12/11 0529) Resp:  [11-29] 14 (12/11 0529) BP: (129-194)/(70-158) 145/70 (12/11 0529) SpO2:  [92 %-100 %] 95 % (12/11 0529) Weight:  [124.7 kg] 124.7 kg (12/10 1013)  Intake/Output from previous day: 12/10 0701 - 12/11 0700 In: 3759.3 [P.O.:600; I.V.:2809.3; IV Piggyback:350] Out: 2162 [Urine:1400; Blood:25] Intake/Output this shift: No intake/output data recorded.  Exam:  Sensation intact distally Intact pulses distally Dorsiflexion/Plantar flexion intact No cellulitis present Compartment soft  Labs: Recent Labs    12/10/19 1151 12/13/19 0315  HGB 12.0 9.9*   Recent Labs    12/10/19 1151 12/13/19 0315  WBC 6.0 9.6  RBC 5.31* 4.38  HCT 38.5 32.4*  PLT 207 172   Recent Labs    12/10/19 1151 12/13/19 0315  NA 143 139  K 3.6 4.2  CL 107 105  CO2 26 25  BUN 25* 20  CREATININE 0.82 0.83  GLUCOSE 99 259*  CALCIUM 8.9 8.4*   No results for input(s): LABPT, INR in the last 72 hours.  Assessment/Plan: Plan at this time is to mobilize with therapy.  Continue with CPM machine.  Pain control.  Anticipate possible discharge tomorrow.   Landry Dyke Shafer Swamy 12/13/2019, 9:17 AM

## 2019-12-13 NOTE — Progress Notes (Signed)
Physical Therapy Treatment Patient Details Name: KAMMIE SCIOLI MRN: [id number] DOB: 10-31-1950 Today's Date: 12/13/2019    History of Present Illness Patient is 69 y.o. female s/p Lt TKA on 12/12/19 with PMH significant for lupus, osteoporosis, HTN, HLD, OA, CAD, DM, obesity, Rt TKA on 07/18/19.    PT Comments    Pt was lethargic but able to participate with therapy.  Min cues for transfer and exercise techniques. Did not perform stairs or go over HEP handout due to lethargy and no family present.  Continue plan of care.   Follow Up Recommendations  Follow surgeon's recommendation for DC plan and follow-up therapies     Equipment Recommendations  None recommended by PT    Recommendations for Other Services       Precautions / Restrictions Precautions Precautions: Fall Restrictions Other Position/Activity Restrictions: WBAT    Mobility  Bed Mobility Overal bed mobility: Needs Assistance Bed Mobility: Supine to Sit;Sit to Supine     Supine to sit: Supervision Sit to supine: Min assist   General bed mobility comments: Pt used gait belt to assist L leg out of bed, PT assisted with L leg back to bed.  Transfers Overall transfer level: Needs assistance Equipment used: Rolling walker (2 wheeled) Transfers: Sit to/from Stand Sit to Stand: Min assist         General transfer comment: cues for hand placement and technique with RW, min guard/light assist to rise safely.  Ambulation/Gait Ambulation/Gait assistance: Min guard Gait Distance (Feet): 100 Feet Assistive device: Rolling walker (2 wheeled) Gait Pattern/deviations: Step-to pattern;Decreased stride length;Decreased weight shift to left;Antalgic Gait velocity: decr   General Gait Details: cues for safe proximity and step pattern with RW,   Stairs             Wheelchair Mobility    Modified Rankin (Stroke Patients Only)       Balance Overall balance assessment: Needs assistance Sitting-balance  support: Feet supported Sitting balance-Leahy Scale: Good     Standing balance support: During functional activity;Bilateral upper extremity supported;No upper extremity supported Standing balance-Leahy Scale: Fair Standing balance comment: Static stand no AD; RW for gait                            Cognition Arousal/Alertness: Lethargic Behavior During Therapy: WFL for tasks assessed/performed Overall Cognitive Status: Within Functional Limits for tasks assessed                                        Exercises      General Comments        Pertinent Vitals/Pain Pain Assessment: 0-10 Pain Score: 2  Pain Location: L knee with movement Pain Descriptors / Indicators: Sore Pain Intervention(s): Limited activity within patient's tolerance;Monitored during session;Repositioned;Ice applied    Home Living                      Prior Function            PT Goals (current goals can now be found in the care plan section) Acute Rehab PT Goals Patient Stated Goal: recover independence PT Goal Formulation: With patient Time For Goal Achievement: 12/19/19 Potential to Achieve Goals: Good Progress towards PT goals: Progressing toward goals    Frequency    7X/week      PT Plan Current plan remains  appropriate    Co-evaluation              AM-PAC PT "6 Clicks" Mobility   Outcome Measure  Help needed turning from your back to your side while in a flat bed without using bedrails?: None Help needed moving from lying on your back to sitting on the side of a flat bed without using bedrails?: A Little Help needed moving to and from a bed to a chair (including a wheelchair)?: A Little Help needed standing up from a chair using your arms (e.g., wheelchair or bedside chair)?: A Little Help needed to walk in hospital room?: A Little Help needed climbing 3-5 steps with a railing? : A Little 6 Click Score: 19    End of Session Equipment  Utilized During Treatment: Gait belt Activity Tolerance: Patient tolerated treatment well Patient left: with call bell/phone within reach;with family/visitor present;in bed;with bed alarm set (Pt keeps sliding down in bed and unable to bend knees of bed due to pt with TKA and needs to rest with leg straight.  Placed bed in trendlenberg position with head elevated to prevent pt sliding.  To accomplish this,bed unable to be left in low position.) Nurse Communication: Mobility status;Other (comment) (notified tech of bed position) PT Visit Diagnosis: Muscle weakness (generalized) (M62.81);Difficulty in walking, not elsewhere classified (R26.2)     Time: 1275-1700 PT Time Calculation (min) (ACUTE ONLY): 20 min  Charges:  $Therapeutic Exercise: 8-22 mins                     Abran Richard, PT Acute Rehab Services Pager [phone] [facility] [place] 12/13/2019, 3:48 PM

## 2019-12-13 NOTE — Discharge Instructions (Signed)

## 2019-12-13 NOTE — Op Note (Signed)
NAME: Virginia Flores, Virginia Flores MEDICAL RECORD [id number] ACCOUNT [id number] DATE OF BIRTH:[birth year] FACILITY: [facility] LOCATION: [id number] PHYSICIAN:Rayvn Rickerson Kerry Fort, MD  OPERATIVE REPORT  DATE OF PROCEDURE:  12/12/2019  PREOPERATIVE DIAGNOSES:  Primary osteoarthritis and degenerative joint disease, left knee.  POSTOPERATIVE DIAGNOSES:  Primary osteoarthritis and degenerative joint disease, left knee.  PROCEDURE:  Left total knee arthroplasty.  IMPLANTS:  Stryker Triathlon cemented knee system, with size 4 femur, size 3 tibial tray, 11 mm thickness fixed bearing polyethylene insert, size 29 patellar button.  SURGEON:  Lind Guest. Ninfa Linden, MD  ASSISTANT:  Erskine Emery, PA-C  ANESTHESIA: 1.  Left lower extremity adductor canal block. 2.  Spinal.  ANTIBIOTICS:  3 g IV Ancef.  BLOOD LOSS:  100 mL.  TOURNIQUET TIME:  Less than 1 hour.  COMPLICATIONS:  None.  INDICATIONS:  The patient is a very pleasant 69 year old female with a debilitating arthritis involving both her knees.  Earlier this year, I did successfully replace her right knee.  She has a BMI of 47 but has been on a significant weight loss journey.   Her left knee is in need of replacement.  Both me and her are confident that if we replace this knee, she will be able to mobilize better and continue on her weight loss journey.  Having had knee replacement surgery before, she is fully aware of the  risk of acute blood loss anemia, nerve and vessel injury, fracture, infection, DVT, and implant failure.  She understands our goals are to decrease pain, improve mobility and overall improve quality of life.  DESCRIPTION OF PROCEDURE:  After informed consent was obtained and appropriate left knee was marked, anesthesia obtained an adductor canal block in the holding room of the left lower extremity.  She was then brought to the operating room and sat up on  the operating table where spinal anesthesia was then obtained.  She was  laid in the supine position on the operating table.  Foley catheter was placed and a nonsterile tourniquet was placed around her upper left thigh.  Her left thigh, knee, leg, ankle  and foot were prepped and draped with DuraPrep and sterile drapes including a sterile stockinette.  Timeout was called and she was identified as correct patient, correct left knee.  We then used an Esmarch to wrap that leg and tourniquet was inflated to  300 mm of pressure.  I then made a direct midline incision over the patella and carried this proximally and distally.  We dissected down the knee joint and carried out a medial parapatellar arthrotomy, finding moderate joint effusion and significant  osteoarthritis throughout her left knee.  There was complete denuding of the cartilage of the lateral compartment and the patellofemoral joint.  We removed periarticular osteophytes from all 3 compartments and then with the knee in a flexed position, we  removed remnants of ACL, PCL, medial and lateral meniscus.  We then set our extramedullary cutting guide, taking 9 mm off the high side.  We made this cut without difficulty.  We corrected for varus and valgus and a neutral slope.  We then used the  intramedullary guide for distal femoral cut, setting this for a 10 mm distal femoral cut for a left knee at 5 degrees externally rotated.  We made that cut without difficulty and brought the knee back down to full extension and achieved full extension  with a 9 mm extension block and actually slightly hyperextension.  We then went back to the femur and put  our femoral sizing guide based off the epicondylar axis.  Based off this, we chose a size 4 femur and this correlated with her other knee as well.   We put a 4-in-1 cutting block for a size 4 femur, made our anterior and posterior cuts, followed by our chamfer cuts.  We then made our femoral box cut.  Attention was then turned back to the tibia.  We chose a size 3 tibial tray for  coverage, setting  the rotation of the tibial tubercle and the femur.  We did our keel punch off of this.  With a size 3 trial tibia and a size 4 left trial femur, we were able to place an 11 mm fixed bearing polyethylene insert trial.  We brought the knee back down to  full extension and put her through several cycles of motion.  We were pleased with the motion with the 11 mm insert.  We then made our patellar cut and drilled 3 holes for a size 29 patellar button.  We then removed all instrumentation from the knee and  irrigated the knee with normal saline solution using pulsatile lavage.  We dried the knee real well and then placed our mixture of Marcaine with epinephrine around the knee arthrotomy.  We then mixed our cement and then with the knee in a flexed  position, we cemented our Stryker Triathlon tibial tray size 3 followed by our size 4 left femur.  We cleaned the cement debris from the knee and then placed our 11 mm fixed bearing polyethylene insert and cemented our patellar button.  We held the knee  in a fully extended position, compressing the cement as well.  Once the cement had hardened, we let the tourniquet down.  Hemostasis obtained with electrocautery.  I put the knee through several more cycles of motion.  I was pleased with the motion and  stability.  We then closed our arthrotomy with interrupted #1 Vicryl suture as we let the tourniquet down.  We closed the deep tissue with 0 Vicryl followed by 2-0 Vicryl in subcutaneous tissue.  The skin was closed with staples.  Xeroform and a  well-padded sterile dressing was applied.  She was taken off the operating table and taken to recovery room in stable condition with all final counts being correct.  There were no complications noted.  Of note, Benita Stabile, PA-C, assisted during the entire  case.  His assistance was crucial for facilitating every aspect of this case.  HN/NUANCE  D:12/12/2019 T:12/13/2019 JOB:[id number]

## 2019-12-14 DIAGNOSIS — M1712 Unilateral primary osteoarthritis, left knee: Secondary | ICD-10-CM | POA: Diagnosis not present

## 2019-12-14 LAB — CBC
HCT: 31.3 % — ABNORMAL LOW (ref 36.0–46.0)
Hemoglobin: 9.7 g/dL — ABNORMAL LOW (ref 12.0–15.0)
MCH: 22.8 pg — ABNORMAL LOW (ref 26.0–34.0)
MCHC: 31 g/dL (ref 30.0–36.0)
MCV: 73.5 fL — ABNORMAL LOW (ref 80.0–100.0)
Platelets: 180 10*3/uL (ref 150–400)
RBC: 4.26 MIL/uL (ref 3.87–5.11)
RDW: 17.8 % — ABNORMAL HIGH (ref 11.5–15.5)
WBC: 13.2 10*3/uL — ABNORMAL HIGH (ref 4.0–10.5)
nRBC: 0 % (ref 0.0–0.2)

## 2019-12-14 LAB — GLUCOSE, CAPILLARY
Glucose-Capillary: 196 mg/dL — ABNORMAL HIGH (ref 70–99)
Glucose-Capillary: 154 mg/dL — ABNORMAL HIGH (ref 70–99)

## 2019-12-14 NOTE — Progress Notes (Signed)
Physical Therapy Treatment Patient Details Name: Virginia Flores MRN: [id number] DOB: 06/26/1950 Today's Date: 12/14/2019    History of Present Illness Patient is 69 y.o. female s/p Lt TKA on 12/12/19 with PMH significant for lupus, osteoporosis, HTN, HLD, OA, CAD, DM, obesity, Rt TKA on 07/18/19.    PT Comments    Pt progressing with gait. She is sleepy but arouses for mobility. dtr hopeful to take pt home today. Pt will have 24hour assist. Will review single step with pt and dtr later this afternoon   Follow Up Recommendations  Follow surgeon's recommendation for DC plan and follow-up therapies     Equipment Recommendations  None recommended by PT    Recommendations for Other Services       Precautions / Restrictions Precautions Precautions: Fall;Knee Required Braces or Orthoses: Knee Immobilizer - Left Knee Immobilizer - Left: Discontinue once straight leg raise with < 10 degree lag Restrictions Weight Bearing Restrictions: No Other Position/Activity Restrictions: WBAT    Mobility  Bed Mobility Overal bed mobility: Needs Assistance Bed Mobility: Supine to Sit;Sit to Supine     Supine to sit: Supervision Sit to supine: Min assist   General bed mobility comments: pt using gait belt to lift LLE into bed and needing min assist to complete. superivison and incr time to come to sit  Transfers Overall transfer level: Needs assistance Equipment used: Rolling walker (2 wheeled) Transfers: Sit to/from Stand Sit to Stand: Min guard         General transfer comment: cues for hand placement and technique with RW, min guard  to rise safely as well as control descent  Ambulation/Gait Ambulation/Gait assistance: Min guard Gait Distance (Feet): 110 Feet Assistive device: Rolling walker (2 wheeled) Gait Pattern/deviations: Step-to pattern;Decreased stride length;Decreased weight shift to left;Antalgic Gait velocity: decr   General Gait Details: cues for safe proximity and  step pattern with RW, requires 4 brief standing rests d/t UE fatigue   Stairs             Wheelchair Mobility    Modified Rankin (Stroke Patients Only)       Balance             Standing balance-Leahy Scale: Fair Standing balance comment: Static stand no AD; RW for gait                            Cognition Arousal/Alertness: Awake/alert;Suspect due to medications (very sleepy but arouses for mobility) Behavior During Therapy: WFL for tasks assessed/performed Overall Cognitive Status: Within Functional Limits for tasks assessed                                        Exercises      General Comments        Pertinent Vitals/Pain Pain Assessment: 0-10 Pain Score: 4  Pain Location: L knee with movement Pain Descriptors / Indicators: Sore Pain Intervention(s): Limited activity within patient's tolerance;Monitored during session;Premedicated before session;Repositioned    Home Living                      Prior Function            PT Goals (current goals can now be found in the care plan section) Acute Rehab PT Goals Patient Stated Goal: recover independence PT Goal Formulation: With patient Time For Goal  Achievement: 12/19/19 Potential to Achieve Goals: Good Progress towards PT goals: Progressing toward goals    Frequency    7X/week      PT Plan Current plan remains appropriate    Co-evaluation              AM-PAC PT "6 Clicks" Mobility   Outcome Measure  Help needed turning from your back to your side while in a flat bed without using bedrails?: None Help needed moving from lying on your back to sitting on the side of a flat bed without using bedrails?: A Little Help needed moving to and from a bed to a chair (including a wheelchair)?: A Little Help needed standing up from a chair using your arms (e.g., wheelchair or bedside chair)?: A Little Help needed to walk in hospital room?: A Little Help  needed climbing 3-5 steps with a railing? : A Little 6 Click Score: 19    End of Session Equipment Utilized During Treatment: Gait belt Activity Tolerance: Patient tolerated treatment well Patient left: in bed;with call bell/phone within reach;with bed alarm set;with family/visitor present Nurse Communication: Mobility status PT Visit Diagnosis: Muscle weakness (generalized) (M62.81);Difficulty in walking, not elsewhere classified (R26.2)     Time: 1121-6244 PT Time Calculation (min) (ACUTE ONLY): 24 min  Charges:  $Gait Training: 23-37 mins                     Baxter Flattery, PT  Acute Rehab Dept ([place]) [phone] Pager [phone]  12/14/2019    [facility] 12/14/2019, 12:13 PM

## 2019-12-14 NOTE — Progress Notes (Signed)
All discharge instructions were given to the Pt. Discussed pain management. All questions were answered. 

## 2019-12-14 NOTE — Progress Notes (Signed)
Subjective: 2 Days Post-Op Procedure(s) (LRB): LEFT TOTAL KNEE ARTHROPLASTY (Left) Patient reports pain as moderate.    Objective: Vital signs in last 24 hours: Temp:  [98.3 F (36.8 C)-100.2 F (37.9 C)] 100.2 F (37.9 C) (12/12 0551) Pulse Rate:  [78-95] 89 (12/12 0551) Resp:  [14-18] 16 (12/12 0551) BP: (149-167)/(65-91) 167/65 (12/12 0551) SpO2:  [89 %-100 %] 91 % (12/12 0551)  Intake/Output from previous day: 12/11 0701 - 12/12 0700 In: 1520 [P.O.:1520] Out: 600 [Urine:600] Intake/Output this shift: Total I/O In: 240 [P.O.:240] Out: -   Recent Labs    12/13/19 0315 12/14/19 0349  HGB 9.9* 9.7*   Recent Labs    12/13/19 0315 12/14/19 0349  WBC 9.6 13.2*  RBC 4.38 4.26  HCT 32.4* 31.3*  PLT 172 180   Recent Labs    12/13/19 0315  NA 139  K 4.2  CL 105  CO2 25  BUN 20  CREATININE 0.83  GLUCOSE 259*  CALCIUM 8.4*   No results for input(s): LABPT, INR in the last 72 hours.  Sensation intact distally Intact pulses distally Dorsiflexion/Plantar flexion intact Incision: scant drainage No cellulitis present Compartment soft   Assessment/Plan: 2 Days Post-Op Procedure(s) (LRB): LEFT TOTAL KNEE ARTHROPLASTY (Left) Up with therapy Discharge home with home health      Virginia Flores 12/14/2019, 12:00 PM

## 2019-12-14 NOTE — Progress Notes (Signed)
12/14/19 1400  PT Visit Information  Last PT Received On 12/14/19  Pt amb short hallway distance, went up/down one step with min assist. Dtr present, very supportive. Both are motivated to go home today. Deferred TKA HEP since pt's lunch arrived.  Assistance Needed +1  History of Present Illness Patient is 69 y.o. female s/p Lt TKA on 12/12/19 with PMH significant for lupus, osteoporosis, HTN, HLD, OA, CAD, DM, obesity, Rt TKA on 07/18/19.  Subjective Data  Patient Stated Goal recover independence  Precautions  Precautions Fall;Knee  Required Braces or Orthoses Knee Immobilizer - Left  Knee Immobilizer - Left Discontinue once straight leg raise with < 10 degree lag  Restrictions  Other Position/Activity Restrictions WBAT  Pain Assessment  Pain Assessment 0-10  Pain Score 3  Pain Location L knee with movement  Pain Descriptors / Indicators Sore  Pain Intervention(s) Limited activity within patient's tolerance;Monitored during session;Repositioned  Cognition  Arousal/Alertness Awake/alert;Suspect due to medications (very sleepy but arouses for mobility)  Behavior During Therapy [facility] for tasks assessed/performed  Overall Cognitive Status Within Functional Limits for tasks assessed  Bed Mobility  Overal bed mobility Needs Assistance  Bed Mobility Supine to Sit;Sit to Supine  Supine to sit Supervision;Min guard  Sit to supine Min assist  General bed mobility comments using gait belt as leg lifter to self assist LLE, incr time, no physical assist to come to sitting  Transfers  Overall transfer level Needs assistance  Equipment used Rolling walker (2 wheeled)  Transfers Sit to/from Stand  Sit to Stand Min guard;Min assist  General transfer comment cues for hand placement and technique with RW, min guard/ligh tmin assist  to rise safely as well as control descent. assist to hold walker in place as pt pushes on high-low walker to come to stand  Ambulation/Gait  Ambulation/Gait  assistance Min guard  Gait Distance (Feet) 40 Feet  Assistive device Rolling walker (2 wheeled)  Gait Pattern/deviations Step-to pattern;Decreased stride length;Decreased weight shift to left;Antalgic;Wide base of support  General Gait Details cues for safe proximity and step pattern with RW  Gait velocity decr  Stairs Yes  Stairs assistance Min assist  Stair Management No rails;Step to pattern;Forwards;With walker  Number of Stairs 1  General stair comments cues for sequence, overall safety. dtr present  Balance  Standing balance-Leahy Scale Fair  Standing balance comment Static stand no AD; RW for gait  PT - End of Session  Equipment Utilized During Treatment Gait belt  Activity Tolerance Patient tolerated treatment well  Patient left in bed;with call bell/phone within reach;with bed alarm set;with family/visitor present  Nurse Communication Mobility status   PT - Assessment/Plan  PT Plan Current plan remains appropriate  PT Visit Diagnosis Muscle weakness (generalized) (M62.81);Difficulty in walking, not elsewhere classified (R26.2)  PT Frequency (ACUTE ONLY) 7X/week  Follow Up Recommendations Follow surgeon's recommendation for DC plan and follow-up therapies  PT equipment None recommended by PT  AM-PAC PT "6 Clicks" Mobility Outcome Measure (Version 2)  Help needed turning from your back to your side while in a flat bed without using bedrails? 4  Help needed moving from lying on your back to sitting on the side of a flat bed without using bedrails? 3  Help needed moving to and from a bed to a chair (including a wheelchair)? 3  Help needed standing up from a chair using your arms (e.g., wheelchair or bedside chair)? 3  Help needed to walk in hospital room? 3  Help needed  climbing 3-5 steps with a railing?  3  6 Click Score 19  Consider Recommendation of Discharge To: Home with [facility]  Acute Rehab PT Goals  PT Goal Formulation With patient  Time For Goal Achievement 12/19/19   Potential to Achieve Goals Good  PT Time Calculation  PT Start Time (ACUTE ONLY) 1404  PT Stop Time (ACUTE ONLY) 1421  PT Time Calculation (min) (ACUTE ONLY) 17 min  PT General Charges  $$ ACUTE PT VISIT 1 Visit  PT Treatments  $Gait Training 8-22 mins
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
## 2019-12-15 ENCOUNTER — Encounter (HOSPITAL_COMMUNITY): Payer: Self-pay | Admitting: Orthopaedic Surgery

## 2019-12-15 ENCOUNTER — Telehealth: Payer: Self-pay | Admitting: Radiology

## 2019-12-15 NOTE — Telephone Encounter (Signed)
Anderson Malta called states that she has tried to reach the patient to get her scheduled and she states that the patient has not returned any calls or messages that have been left for her. They will continue to try to reach her tomorrow.

## 2019-12-20 ENCOUNTER — Other Ambulatory Visit: Payer: Self-pay | Admitting: Physician Assistant

## 2019-12-22 ENCOUNTER — Other Ambulatory Visit: Payer: Self-pay | Admitting: Orthopaedic Surgery

## 2019-12-22 NOTE — Telephone Encounter (Signed)
She just had total replacement surgery on 12/12/19

## 2019-12-22 NOTE — Telephone Encounter (Signed)
Why?  I tried to call the patient there was a voicemail [place] that it was the [place] [facility] and I left a message for her to call back.  If you can try to contact her see why she feels she needs an antibiotic at this point being postop not a good 2 weeks.

## 2019-12-23 NOTE — Telephone Encounter (Signed)
Can you cal her again ?

## 2019-12-24 NOTE — Telephone Encounter (Signed)
Have called and left voicemail's several times

## 2019-12-29 ENCOUNTER — Other Ambulatory Visit: Payer: Self-pay

## 2019-12-29 ENCOUNTER — Encounter: Payer: Self-pay | Admitting: Orthopaedic Surgery

## 2019-12-29 ENCOUNTER — Ambulatory Visit (INDEPENDENT_AMBULATORY_CARE_PROVIDER_SITE_OTHER): Payer: 59 | Admitting: Orthopaedic Surgery

## 2019-12-29 DIAGNOSIS — Z96652 Presence of left artificial knee joint: Secondary | ICD-10-CM

## 2019-12-29 MED ORDER — OXYCODONE HCL 5 MG PO TABS: 5.0000 mg | ORAL_TABLET | Freq: Four times a day (QID) | ORAL | 0 refills | Status: DC | PRN

## 2019-12-29 NOTE — Addendum Note (Signed)
Addended by: Barbette Or on: 12/29/2019 03:48 PM   Modules accepted: Orders

## 2019-12-29 NOTE — Progress Notes (Signed)
The patient is a 69 year old female who is 2 weeks status post a left total knee arthroplasty.  She reports that she is doing well.  We actually replaced her right knee earlier this year.  On exam her staple line looks good so the staples have been removed and Steri-Strips applied.  Her calf is soft.  She has been on aspirin twice a day.  Her extension is full with her left operative knee and her flexion is to 90 degrees.  We will work on transitioning her to outpatient physical therapy soon.  She would like this done at [facility] which I agree with.  She will stop her aspirin after another week.  I will refill her oxycodone.  All questions and concerns were answered and addressed.  I will see her back in 4 weeks to see how she is doing overall.

## 2020-01-01 NOTE — Telephone Encounter (Signed)
Ok thanks 

## 2020-01-16 ENCOUNTER — Telehealth: Payer: Self-pay

## 2020-01-16 NOTE — Telephone Encounter (Signed)
[facility] from [facility] called she is releasing pt from pt today and will start outpatient rehab 1/19 CB:[phone]

## 2020-01-21 ENCOUNTER — Ambulatory Visit: Payer: 59 | Admitting: Physical Therapy

## 2020-01-26 ENCOUNTER — Ambulatory Visit: Payer: 59 | Admitting: Orthopaedic Surgery

## 2020-01-29 ENCOUNTER — Other Ambulatory Visit: Payer: Self-pay

## 2020-01-29 ENCOUNTER — Encounter: Payer: Self-pay | Admitting: Physical Therapy

## 2020-01-29 ENCOUNTER — Ambulatory Visit: Payer: Medicare Other | Attending: Family Medicine | Admitting: Physical Therapy

## 2020-01-29 DIAGNOSIS — M5442 Lumbago with sciatica, left side: Secondary | ICD-10-CM | POA: Diagnosis present

## 2020-01-29 DIAGNOSIS — M25662 Stiffness of left knee, not elsewhere classified: Secondary | ICD-10-CM | POA: Diagnosis present

## 2020-01-29 DIAGNOSIS — R262 Difficulty in walking, not elsewhere classified: Secondary | ICD-10-CM | POA: Insufficient documentation

## 2020-01-29 DIAGNOSIS — M25562 Pain in left knee: Secondary | ICD-10-CM | POA: Insufficient documentation

## 2020-01-29 DIAGNOSIS — R600 Localized edema: Secondary | ICD-10-CM | POA: Insufficient documentation

## 2020-01-29 DIAGNOSIS — M5441 Lumbago with sciatica, right side: Secondary | ICD-10-CM | POA: Diagnosis present

## 2020-01-29 DIAGNOSIS — M6281 Muscle weakness (generalized): Secondary | ICD-10-CM | POA: Insufficient documentation
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
## 2020-01-29 NOTE — Patient Instructions (Signed)
  Access Code: [email or web address] URL: [email or web address] Date: 01/29/2020 Prepared by: Annie Paras  Exercises Seated Hamstring Stretch - 3 x daily - 7 x weekly - 2-3 reps - 30 sec hold Standing Gastroc Stretch at Counter - 3 x daily - 7 x weekly - 2-3 reps - 30 sec hold

## 2020-02-02 ENCOUNTER — Ambulatory Visit (INDEPENDENT_AMBULATORY_CARE_PROVIDER_SITE_OTHER): Payer: 59 | Admitting: Orthopaedic Surgery

## 2020-02-02 ENCOUNTER — Encounter: Payer: Self-pay | Admitting: Orthopaedic Surgery

## 2020-02-02 DIAGNOSIS — Z96652 Presence of left artificial knee joint: Secondary | ICD-10-CM

## 2020-02-02 NOTE — Progress Notes (Signed)
The patient is now just over 6 weeks status post a left total knee arthroplasty.  She says that she is doing great and is better than some of her friends that have had this before.  She reports increased range of motion and strength and minimal pain.  She is ambulate with a cane.  On examination of her left operative knee her extension is almost full and her flexion is to past 90 degrees.  There is some swelling of her knee to be expected and some warmth to the knee.  Overall she does look great.  The knee feels stable ligamentously.  She will continue to push herself through outpatient physical therapy.  I will see her back in 4 weeks to see how she is doing overall but no x-rays are needed.

## 2020-02-06 ENCOUNTER — Ambulatory Visit: Payer: 59 | Attending: Internal Medicine

## 2020-02-06 DIAGNOSIS — Z23 Encounter for immunization: Secondary | ICD-10-CM

## 2020-02-06 NOTE — Progress Notes (Signed)
   Covid-19 Vaccination Clinic  Name:  Virginia Flores    MRN: [id number] DOB: 05-Jul-1950  02/06/2020  [name] was observed post Covid-19 immunization for 15 minutes without incident. She was provided with Vaccine Information Sheet and instruction to access the V-Safe system.   [name] was instructed to call 911 with any severe reactions post vaccine: [name] Difficulty breathing  . Swelling of face and throat  . A fast heartbeat  . A bad rash all over body  . Dizziness and weakness   Immunizations Administered    Name Date Dose VIS Date Route   Moderna Covid-19 Booster Vaccine 02/06/2020  2:38 PM 0.25 mL 10/22/2019 Intramuscular   Manufacturer: Moderna   Lot: [id number]   [place]: [id number]

## 2020-02-10 ENCOUNTER — Other Ambulatory Visit: Payer: Self-pay

## 2020-02-10 ENCOUNTER — Ambulatory Visit: Payer: 59 | Attending: Family Medicine

## 2020-02-10 DIAGNOSIS — M6281 Muscle weakness (generalized): Secondary | ICD-10-CM | POA: Diagnosis present

## 2020-02-10 DIAGNOSIS — R600 Localized edema: Secondary | ICD-10-CM | POA: Insufficient documentation

## 2020-02-10 DIAGNOSIS — R262 Difficulty in walking, not elsewhere classified: Secondary | ICD-10-CM | POA: Insufficient documentation

## 2020-02-10 DIAGNOSIS — M5441 Lumbago with sciatica, right side: Secondary | ICD-10-CM | POA: Diagnosis present

## 2020-02-10 DIAGNOSIS — M25661 Stiffness of right knee, not elsewhere classified: Secondary | ICD-10-CM | POA: Insufficient documentation

## 2020-02-10 DIAGNOSIS — M5442 Lumbago with sciatica, left side: Secondary | ICD-10-CM | POA: Diagnosis present

## 2020-02-10 DIAGNOSIS — M25662 Stiffness of left knee, not elsewhere classified: Secondary | ICD-10-CM | POA: Insufficient documentation

## 2020-02-10 DIAGNOSIS — M25561 Pain in right knee: Secondary | ICD-10-CM

## 2020-02-10 DIAGNOSIS — M25562 Pain in left knee: Secondary | ICD-10-CM | POA: Insufficient documentation
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
## 2020-02-11 ENCOUNTER — Ambulatory Visit: Payer: 59

## 2020-02-12 ENCOUNTER — Ambulatory Visit: Payer: 59 | Admitting: Physical Therapy

## 2020-02-17 ENCOUNTER — Ambulatory Visit: Payer: 59

## 2020-02-17 ENCOUNTER — Other Ambulatory Visit: Payer: Self-pay

## 2020-02-17 DIAGNOSIS — M25661 Stiffness of right knee, not elsewhere classified: Secondary | ICD-10-CM

## 2020-02-17 DIAGNOSIS — M25562 Pain in left knee: Secondary | ICD-10-CM

## 2020-02-17 DIAGNOSIS — R600 Localized edema: Secondary | ICD-10-CM

## 2020-02-17 DIAGNOSIS — R262 Difficulty in walking, not elsewhere classified: Secondary | ICD-10-CM

## 2020-02-17 DIAGNOSIS — M6281 Muscle weakness (generalized): Secondary | ICD-10-CM

## 2020-02-17 DIAGNOSIS — M25662 Stiffness of left knee, not elsewhere classified: Secondary | ICD-10-CM | POA: Diagnosis not present

## 2020-02-17 DIAGNOSIS — M25561 Pain in right knee: Secondary | ICD-10-CM

## 2020-02-17 DIAGNOSIS — M5441 Lumbago with sciatica, right side: Secondary | ICD-10-CM
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
## 2020-02-19 ENCOUNTER — Other Ambulatory Visit: Payer: Self-pay

## 2020-02-19 ENCOUNTER — Ambulatory Visit: Payer: 59

## 2020-02-19 DIAGNOSIS — M6281 Muscle weakness (generalized): Secondary | ICD-10-CM

## 2020-02-19 DIAGNOSIS — M25561 Pain in right knee: Secondary | ICD-10-CM

## 2020-02-19 DIAGNOSIS — R262 Difficulty in walking, not elsewhere classified: Secondary | ICD-10-CM

## 2020-02-19 DIAGNOSIS — M25661 Stiffness of right knee, not elsewhere classified: Secondary | ICD-10-CM

## 2020-02-19 DIAGNOSIS — M25562 Pain in left knee: Secondary | ICD-10-CM

## 2020-02-19 DIAGNOSIS — M25662 Stiffness of left knee, not elsewhere classified: Secondary | ICD-10-CM

## 2020-02-19 DIAGNOSIS — R600 Localized edema: Secondary | ICD-10-CM

## 2020-02-19 DIAGNOSIS — M5441 Lumbago with sciatica, right side: Secondary | ICD-10-CM
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
## 2020-02-23 ENCOUNTER — Ambulatory Visit: Payer: 59 | Admitting: Orthopaedic Surgery

## 2020-02-24 ENCOUNTER — Ambulatory Visit: Payer: 59

## 2020-02-24 ENCOUNTER — Other Ambulatory Visit: Payer: Self-pay

## 2020-02-24 DIAGNOSIS — M25661 Stiffness of right knee, not elsewhere classified: Secondary | ICD-10-CM

## 2020-02-24 DIAGNOSIS — M25662 Stiffness of left knee, not elsewhere classified: Secondary | ICD-10-CM

## 2020-02-24 DIAGNOSIS — M25561 Pain in right knee: Secondary | ICD-10-CM

## 2020-02-24 DIAGNOSIS — R600 Localized edema: Secondary | ICD-10-CM

## 2020-02-24 DIAGNOSIS — M6281 Muscle weakness (generalized): Secondary | ICD-10-CM

## 2020-02-24 DIAGNOSIS — M25562 Pain in left knee: Secondary | ICD-10-CM

## 2020-02-24 DIAGNOSIS — R262 Difficulty in walking, not elsewhere classified: Secondary | ICD-10-CM

## 2020-02-24 DIAGNOSIS — M5441 Lumbago with sciatica, right side: Secondary | ICD-10-CM
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
## 2020-02-26 ENCOUNTER — Ambulatory Visit: Payer: 59 | Admitting: Physical Therapy

## 2020-03-02 ENCOUNTER — Ambulatory Visit: Payer: 59

## 2020-03-04 ENCOUNTER — Encounter: Payer: 59 | Admitting: Physical Therapy

## 2020-04-14 ENCOUNTER — Encounter: Payer: Self-pay | Admitting: Orthopaedic Surgery

## 2020-04-14 ENCOUNTER — Ambulatory Visit: Payer: Medicare Other | Admitting: Orthopaedic Surgery

## 2020-04-14 DIAGNOSIS — Z96652 Presence of left artificial knee joint: Secondary | ICD-10-CM

## 2020-04-14 NOTE — Progress Notes (Signed)
The patient is now 4 months status post a left total knee arthroplasty.  She embolus with a cane and states she is doing excellent.  She gets some stiffness and had to do with a Covid infection back in February.  She said she had gotten significantly short of breath.  She says she is doing very well.  She says the knee has good range of motion no issues and feels stable.  Examination of her left operative knee shows full range of motion.  And is stable.  He has minimal swelling.  At this point I will need to see her back for 6 months unless there is issues.  At that visit would like an AP and lateral of her left knee.  All questions and concerns were answered and addressed.

## 2020-08-03 ENCOUNTER — Telehealth: Payer: Self-pay | Admitting: *Deleted

## 2020-08-03 NOTE — Telephone Encounter (Signed)
Dr Silverio Decamp  This pt has been scheduled as a direct colon 8-31 for a + FIT- her last colon was with Dr Olevia Perches in 2007  She has a BMI of 50.7- she has hx of GERD, DM, OSA, HTN, Aortic Valve d/d- mild aortic stenosis- Echo 04-02-2019 EF 60-65 and mild stenosis, carotid artery disease, [palpations and RA  She is 70 years old  - Do you want her to have an OV or direct at [facility]  Please advise- Thanks Lelan Pons PV

## 2020-08-03 NOTE — Telephone Encounter (Signed)
Tried pt at [phone]- no answer- Mail box full unable to LM Tried cell n umber- went ot VM, no VM set up  Pt needs next available OV with Dr Silverio Decamp per this TE-

## 2020-08-03 NOTE — Telephone Encounter (Signed)
Please schedule office visit next available. Thanks

## 2020-08-04 NOTE — Telephone Encounter (Signed)
Attempted to call pt to make an [place] on machine, no answer - tried mobile number   OV 9-22 at 230 pm with Virginia Flores- pt will be OOT the whole month of October

## 2020-09-01 ENCOUNTER — Encounter: Payer: Medicare Other | Admitting: Gastroenterology

## 2020-09-23 ENCOUNTER — Ambulatory Visit: Payer: Medicare Other | Admitting: Physician Assistant

## 2020-09-23 ENCOUNTER — Encounter: Payer: Self-pay | Admitting: Physician Assistant

## 2020-09-23 VITALS — BP 144/80 | HR 76 | Ht 62.0 in | Wt 298.0 lb

## 2020-09-23 DIAGNOSIS — R195 Other fecal abnormalities: Secondary | ICD-10-CM | POA: Diagnosis not present

## 2020-09-23 MED ORDER — NA SULFATE-K SULFATE-MG SULF 17.5-3.13-1.6 GM/177ML PO SOLN: 1.0000 | Freq: Once | ORAL | 0 refills | Status: DC

## 2020-09-23 NOTE — Patient Instructions (Signed)
You have been scheduled for a colonoscopy. Please follow written instructions given to you at your visit today.  Please pick up your prep supplies at the pharmacy within the next 1-3 days. If you use inhalers (even only as needed), please bring them with you on the day of your procedure.  If you are age 72 or older, your body mass index should be between 23-30. Your Body mass index is 54.5 kg/m. If this is out of the aforementioned range listed, please consider follow up with your Primary Care Provider.  If you are age 106 or younger, your body mass index should be between 19-25. Your Body mass index is 54.5 kg/m. If this is out of the aformentioned range listed, please consider follow up with your Primary Care Provider.   __________________________________________________________  The [facility] providers would like to encourage you to use [facility] to communicate with providers for non-urgent requests or questions.  Due to long hold times on the telephone, sending your provider a message by [facility] may be a faster and more efficient way to get a response.  Please allow 48 business hours for a response.  Please remember that this is for non-urgent requests.
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
## 2020-09-26 NOTE — Progress Notes (Signed)
Agree with assessment and plan as outlined.  

## 2020-10-14 ENCOUNTER — Ambulatory Visit: Payer: Medicare Other | Admitting: Orthopaedic Surgery

## 2020-11-08 ENCOUNTER — Encounter (HOSPITAL_COMMUNITY): Payer: Self-pay | Admitting: Gastroenterology

## 2020-11-09 NOTE — Progress Notes (Signed)
Attempted to obtain medical history via telephone, unable to reach at this time. I left a voicemail to return pre surgical testing department's phone call.  

## 2020-11-10 ENCOUNTER — Telehealth: Payer: Self-pay

## 2020-11-10 MED ORDER — NA SULFATE-K SULFATE-MG SULF 17.5-3.13-1.6 GM/177ML PO SOLN: 1.0000 | Freq: Once | ORAL | 0 refills | Status: AC

## 2020-11-10 NOTE — Telephone Encounter (Signed)
Called and spoke to patient to remind her of her procedure next week at [facility] on Thursday, 11-17. Patient never picked up her prep and [facility]  [facility].  Patient reminded to arrive at 8am

## 2020-11-11 ENCOUNTER — Telehealth: Payer: Self-pay

## 2020-11-11 NOTE — Telephone Encounter (Signed)
Error

## 2020-11-11 NOTE — Telephone Encounter (Signed)
Patient called said she is not able to pay for her prep requested a call back.

## 2020-11-11 NOTE — Telephone Encounter (Signed)
Called and spoke to patient. Suprep was over $50 and she doesn't get paid until next week and she needs the prep by Wednesday.  I instructed her to pick up new instructions and a sample Plenvu tomorrow.   Patient agreed and expressed understanding

## 2020-11-18 ENCOUNTER — Ambulatory Visit (HOSPITAL_COMMUNITY): Payer: Medicare Other | Admitting: Certified Registered Nurse Anesthetist

## 2020-11-18 ENCOUNTER — Ambulatory Visit (HOSPITAL_COMMUNITY)
Admission: RE | Admit: 2020-11-18 | Discharge: 2020-11-18 | Disposition: A | Discharge status: Home / Self Care | Payer: Medicare Other | Attending: Gastroenterology | Admitting: Gastroenterology

## 2020-11-18 ENCOUNTER — Encounter (HOSPITAL_COMMUNITY)
Admission: RE | Disposition: A | Discharge status: Home / Self Care | Payer: Self-pay | Source: Home / Self Care | Attending: Gastroenterology

## 2020-11-18 ENCOUNTER — Encounter (HOSPITAL_COMMUNITY): Payer: Self-pay | Admitting: Gastroenterology

## 2020-11-18 ENCOUNTER — Other Ambulatory Visit: Payer: Self-pay

## 2020-11-18 DIAGNOSIS — R195 Other fecal abnormalities: Secondary | ICD-10-CM | POA: Diagnosis not present

## 2020-11-18 DIAGNOSIS — D126 Benign neoplasm of colon, unspecified: Secondary | ICD-10-CM | POA: Diagnosis not present

## 2020-11-18 DIAGNOSIS — M329 Systemic lupus erythematosus, unspecified: Secondary | ICD-10-CM | POA: Insufficient documentation

## 2020-11-18 DIAGNOSIS — Z6841 Body Mass Index (BMI) 40.0 and over, adult: Secondary | ICD-10-CM | POA: Diagnosis not present

## 2020-11-18 DIAGNOSIS — I350 Nonrheumatic aortic (valve) stenosis: Secondary | ICD-10-CM | POA: Diagnosis not present

## 2020-11-18 DIAGNOSIS — I10 Essential (primary) hypertension: Secondary | ICD-10-CM | POA: Diagnosis not present

## 2020-11-18 DIAGNOSIS — D125 Benign neoplasm of sigmoid colon: Secondary | ICD-10-CM | POA: Insufficient documentation

## 2020-11-18 DIAGNOSIS — K648 Other hemorrhoids: Secondary | ICD-10-CM | POA: Insufficient documentation

## 2020-11-18 DIAGNOSIS — K219 Gastro-esophageal reflux disease without esophagitis: Secondary | ICD-10-CM | POA: Diagnosis not present

## 2020-11-18 DIAGNOSIS — D122 Benign neoplasm of ascending colon: Secondary | ICD-10-CM | POA: Diagnosis not present

## 2020-11-18 DIAGNOSIS — Z7984 Long term (current) use of oral hypoglycemic drugs: Secondary | ICD-10-CM | POA: Diagnosis not present

## 2020-11-18 DIAGNOSIS — E119 Type 2 diabetes mellitus without complications: Secondary | ICD-10-CM | POA: Diagnosis not present

## 2020-11-18 HISTORY — PX: COLONOSCOPY WITH PROPOFOL: SHX5780

## 2020-11-18 HISTORY — PX: POLYPECTOMY: SHX5525

## 2020-11-18 LAB — GLUCOSE, CAPILLARY: Glucose-Capillary: 85 mg/dL (ref 70–99)

## 2020-11-18 SURGERY — COLONOSCOPY WITH PROPOFOL
Anesthesia: Monitor Anesthesia Care

## 2020-11-18 MED ORDER — LABETALOL HCL 5 MG/ML IV SOLN
INTRAVENOUS | Status: DC | PRN
  Administered 2020-11-18: 10 mg via INTRAVENOUS
  Administered 2020-11-18: 5 mg via INTRAVENOUS

## 2020-11-18 MED ORDER — LIDOCAINE HCL (CARDIAC) PF 100 MG/5ML IV SOSY
PREFILLED_SYRINGE | INTRAVENOUS | Status: DC | PRN
  Administered 2020-11-18: 100 mg via INTRAVENOUS

## 2020-11-18 MED ORDER — SODIUM CHLORIDE 0.9 % IV SOLN: INTRAVENOUS | Status: DC

## 2020-11-18 MED ORDER — PROPOFOL 10 MG/ML IV BOLUS
INTRAVENOUS | Status: DC | PRN
  Administered 2020-11-18: 20 mg via INTRAVENOUS
  Administered 2020-11-18: 30 mg via INTRAVENOUS
  Administered 2020-11-18: 10 mg via INTRAVENOUS

## 2020-11-18 MED ORDER — PROPOFOL 500 MG/50ML IV EMUL
INTRAVENOUS | Status: DC | PRN
  Administered 2020-11-18: 125 ug/kg/min via INTRAVENOUS

## 2020-11-18 MED ORDER — LACTATED RINGERS IV SOLN: INTRAVENOUS | Status: DC

## 2020-11-18 SURGICAL SUPPLY — 22 items
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
## 2020-11-18 NOTE — Discharge Instructions (Signed)
YOU HAD AN ENDOSCOPIC PROCEDURE TODAY: Refer to the procedure report and other information in the discharge instructions given to you for any specific questions about what was found during the examination. If this information does not answer your questions, please call [place] office at [phone] to clarify.   YOU SHOULD EXPECT: Some feelings of bloating in the abdomen. Passage of more gas than usual. Walking can help get rid of the air that was put into your GI tract during the procedure and reduce the bloating. If you had a lower endoscopy (such as a colonoscopy or flexible sigmoidoscopy) you may notice spotting of blood in your stool or on the toilet paper. Some abdominal soreness may be present for a day or two, also.  DIET: Your first meal following the procedure should be a light meal and then it is ok to progress to your normal diet. A half-sandwich or bowl of soup is an example of a good first meal. Heavy or fried foods are harder to digest and may make you feel nauseous or bloated. Drink plenty of fluids but you should avoid alcoholic beverages for 24 hours. If you had a esophageal dilation, please see attached instructions for diet.    ACTIVITY: Your care partner should take you home directly after the procedure. You should plan to take it easy, moving slowly for the rest of the day. You can resume normal activity the day after the procedure however YOU SHOULD NOT DRIVE, use power tools, machinery or perform tasks that involve climbing or major physical exertion for 24 hours (because of the sedation medicines used during the test).   SYMPTOMS TO REPORT IMMEDIATELY: A gastroenterologist can be reached at any hour. Please call [phone]  for any of the following symptoms:  Following lower endoscopy (colonoscopy, flexible sigmoidoscopy) Excessive amounts of blood in the stool  Significant tenderness, worsening of abdominal pains  Swelling of the abdomen that is new, acute  Fever of 100 or  higher   Vomiting of blood or coffee ground material  New, significant abdominal pain  New, significant chest pain or pain under the shoulder blades  Painful or persistently difficult swallowing  New shortness of breath  Black, tarry-looking or red, bloody stools  FOLLOW UP:  If any biopsies were taken you will be contacted by phone or by letter within the next 1-3 weeks. Call [phone]  if you have not heard about the biopsies in 3 weeks.  Please also call with any specific questions about appointments or follow up tests.

## 2020-11-18 NOTE — Anesthesia Postprocedure Evaluation (Signed)
Anesthesia Post Note  Patient: Virginia Flores  Procedure(s) Performed: COLONOSCOPY WITH PROPOFOL POLYPECTOMY     Patient location during evaluation: PACU Anesthesia Type: MAC Level of consciousness: awake and alert Pain management: pain level controlled Vital Signs Assessment: post-procedure vital signs reviewed and stable Respiratory status: spontaneous breathing and respiratory function stable Cardiovascular status: stable Postop Assessment: no apparent nausea or vomiting Anesthetic complications: no   No notable events documented.  Last Vitals:  Vitals:   11/18/20 1015 11/18/20 1023  BP: 136/78 (!) 170/78  Pulse: 84 83  Resp: 16 20  Temp:    SpO2: 95% 94%    Last Pain:  Vitals:   11/18/20 1015  TempSrc:   PainSc: 0-No pain                 Merlinda Frederick

## 2020-11-18 NOTE — Anesthesia Preprocedure Evaluation (Addendum)
Anesthesia Evaluation  Patient identified by MRN, date of birth, ID band Patient awake    Reviewed: Allergy & Precautions, H&P , NPO status , Patient's Chart, lab work & pertinent test results, reviewed documented beta blocker date and time   Airway Mallampati: III  TM Distance: >3 FB Neck ROM: Full    Dental no notable dental hx. (+) Teeth Intact, Dental Advisory Given   Pulmonary sleep apnea ,    Pulmonary exam normal breath sounds clear to auscultation       Cardiovascular hypertension, Pt. on medications and Pt. on home beta blockers + Valvular Problems/Murmurs (mild AS and mild AI) AS  Rhythm:Regular Rate:Normal  Left ventricular ejection fraction, by estimation, is 60 to 65%. The  left ventricle has normal function. The left ventricle has no regional  wall motion abnormalities. There is moderate left ventricular hypertrophy.  Left ventricular diastolic  parameters are consistent with Grade II diastolic dysfunction  (pseudonormalization). Elevated left atrial pressure.  2. Right ventricular systolic function is normal. The right ventricular  size is mildly enlarged. Tricuspid regurgitation signal is inadequate for  assessing PA pressure.  3. The mitral valve is abnormal. Moderate mitral annular calcification.  Trivial mitral valve regurgitation.  4. The aortic valve was not well visualized. Aortic valve regurgitation  is mild. Mild aortic valve stenosis. Vmax 2.2 m/s, MG 10 mmHg, AVA 1.8  cm^2, DI 0.59  5. The inferior vena cava is normal in size with greater than 50%  respiratory variability, suggesting right atrial pressure of 3 mmHg.    Neuro/Psych  Neuromuscular disease (sciatica) negative psych ROS   GI/Hepatic Neg liver ROS, GERD  Medicated,  Endo/Other  diabetes, Type 2, Oral Hypoglycemic AgentsMorbid obesity (super morbid obesity BMI 53)lupus  Renal/GU negative Renal ROS  negative genitourinary    Musculoskeletal  (+) Arthritis , Osteoarthritis and Rheumatoid disorders,    Abdominal (+) + obese,   Peds  Hematology negative hematology ROS (+)   Anesthesia Other Findings   Reproductive/Obstetrics negative OB ROS                            Anesthesia Physical  Anesthesia Plan  ASA: 4  Anesthesia Plan: MAC   Post-op Pain Management:  Regional for Post-op pain   Induction: Intravenous  PONV Risk Score and Plan: 3 and Propofol infusion, TIVA and Treatment may vary due to age or medical condition  Airway Management Planned: Simple Face Mask and Natural Airway  Additional Equipment: None  Intra-op Plan:   Post-operative Plan:   Informed Consent: I have reviewed the patients History and Physical, chart, labs and discussed the procedure including the risks, benefits and alternatives for the proposed anesthesia with the patient or authorized representative who has indicated his/her understanding and acceptance.     Dental advisory given  Plan Discussed with: CRNA, Surgeon and Anesthesiologist  Anesthesia Plan Comments: (BP severely elevated. Patient asymptomatic. Patient states she has not taken her meds in a couple of days because she was awaiting a phone call on what to take. She has her meds with her. We will give her home po dose of amlodipine, and give labetalol IV. She is to restart her regular regimen as soon as she gets home. She expressed understanding. Norton Blizzard, MD  )      Anesthesia Quick Evaluation

## 2020-11-18 NOTE — Transfer of Care (Signed)
Immediate Anesthesia Transfer of Care Note  Patient: Virginia Flores  Procedure(s) Performed: COLONOSCOPY WITH PROPOFOL POLYPECTOMY  Patient Location: Endoscopy Unit  Anesthesia Type:MAC  Level of Consciousness: awake and drowsy  Airway & Oxygen Therapy: Patient Spontanous Breathing  Post-op Assessment: Report given to RN and Post -op Vital signs reviewed and stable  Post vital signs: Reviewed and stable  Last Vitals:  Vitals Value Taken Time  BP 100/67 1003  Temp    Pulse 85   Resp    SpO2 95%     Last Pain:  Vitals:   11/18/20 0843  TempSrc: Oral  PainSc: 7          Complications: No notable events documented.

## 2020-11-18 NOTE — Op Note (Signed)
[facility] Patient Name: Virginia Flores Procedure Date: 11/18/2020 MRN: [id number] Attending MD: Carlota Raspberry. Havery Moros , MD Date of Birth: 1950/01/30 CSN: [id number] Age: 70 Admit Type: Outpatient Procedure:                Colonoscopy Indications:              Positive fecal immunochemical test, BMI > 50 - case                            done at the hospital for anesthesia support Providers:                Remo Lipps P. Havery Moros, MD, Carmie End, RN,                            Frazier Richards, Technician Referring MD:              Medicines:                Monitored Anesthesia Care Complications:            No immediate complications. Estimated blood loss:                            Minimal. Estimated Blood Loss:     Estimated blood loss was minimal. Procedure:                Pre-Anesthesia Assessment:                           - Prior to the procedure, a History and Physical                            was performed, and patient medications and                            allergies were reviewed. The patient's tolerance of                            previous anesthesia was also reviewed. The risks                            and benefits of the procedure and the sedation                            options and risks were discussed with the patient.                            All questions were answered, and informed consent                            was obtained. Prior Anticoagulants: The patient has                            taken no previous anticoagulant or antiplatelet  agents. ASA Grade Assessment: III - A patient with                            severe systemic disease. After reviewing the risks                            and benefits, the patient was deemed in                            satisfactory condition to undergo the procedure.                           After obtaining informed consent, the colonoscope                             was passed under direct vision. Throughout the                            procedure, the patient's blood pressure, pulse, and                            oxygen saturations were monitored continuously. The                            CF-HQ190L ([id number]) Olympus colonoscope was                            introduced through the anus and advanced to the the                            cecum, identified by appendiceal orifice and                            ileocecal valve. The colonoscopy was performed                            without difficulty. The patient tolerated the                            procedure well. The quality of the bowel                            preparation was good. The ileocecal valve,                            appendiceal orifice, and rectum were photographed. Scope In: 9:34:17 AM Scope Out: 9:58:37 AM Scope Withdrawal Time: 0 hours 19 minutes 26 seconds  Total Procedure Duration: 0 hours 24 minutes 20 seconds  Findings:      The perianal and digital rectal examinations were normal.      Two sessile polyps were found in the cecum. The polyps were 3 to 4 mm in       size. These polyps were removed with a cold snare. Resection and       retrieval were complete.  A 5 mm polyp was found in the ascending colon. The polyp was sessile.       The polyp was removed with a cold snare. Resection and retrieval were       complete.      Three sessile polyps were found in the transverse colon. The polyps were       3 mm in size. These polyps were removed with a cold snare. Resection and       retrieval were complete.      Two sessile polyps were found in the sigmoid colon. The polyps were 4 to       5 mm in size. These polyps were removed with a cold snare. Resection and       retrieval were complete.      Internal hemorrhoids were found during retroflexion.      The exam was otherwise without abnormality. Impression:               - Two 3 to 4 mm polyps in the cecum, removed  with a                            cold snare. Resected and retrieved.                           - One 5 mm polyp in the ascending colon, removed                            with a cold snare. Resected and retrieved.                           - Three 3 mm polyps in the transverse colon,                            removed with a cold snare. Resected and retrieved.                           - Two 4 to 5 mm polyps in the sigmoid colon,                            removed with a cold snare. Resected and retrieved.                           - Internal hemorrhoids.                           - The examination was otherwise normal. Moderate Sedation:      No moderate sedation, case performed with MAC Recommendation:           - Patient has a contact number available for                            emergencies. The signs and symptoms of potential                            delayed complications were discussed with the  patient. Return to normal activities tomorrow.                            Written discharge instructions were provided to the                            patient.                           - Resume previous diet.                           - Continue present medications.                           - Await pathology results. Procedure Code(s):        --- Professional ---                           [phone], Colonoscopy, flexible; with removal of                            tumor(s), polyp(s), or other lesion(s) by snare                            technique Diagnosis Code(s):        --- Professional ---                           K63.5, Polyp of colon                           K64.8, Other hemorrhoids                           R19.5, Other fecal abnormalities CPT copyright 2019 American Medical Association. All rights reserved. The codes documented in this report are preliminary and upon coder review may  be revised to meet current compliance requirements. Remo Lipps P.  Zarayah Lanting, MD 11/18/2020 10:06:37 AM This report has been signed electronically. Number of Addenda: 0

## 2020-11-19 LAB — SURGICAL PATHOLOGY

## 2020-11-21 ENCOUNTER — Encounter (HOSPITAL_COMMUNITY): Payer: Self-pay | Admitting: Gastroenterology

## 2022-03-24 ENCOUNTER — Other Ambulatory Visit (HOSPITAL_COMMUNITY): Payer: Self-pay | Admitting: Family Medicine

## 2022-03-24 DIAGNOSIS — R7989 Other specified abnormal findings of blood chemistry: Secondary | ICD-10-CM

## 2022-04-13 ENCOUNTER — Ambulatory Visit (HOSPITAL_BASED_OUTPATIENT_CLINIC_OR_DEPARTMENT_OTHER)
Admission: RE | Admit: 2022-04-13 | Discharge: 2022-04-13 | Disposition: A | Discharge status: Home / Self Care | Payer: Medicare (Managed Care) | Source: Ambulatory Visit | Attending: Family Medicine | Admitting: Family Medicine

## 2022-04-13 DIAGNOSIS — R0602 Shortness of breath: Secondary | ICD-10-CM | POA: Diagnosis not present

## 2022-04-13 DIAGNOSIS — E785 Hyperlipidemia, unspecified: Secondary | ICD-10-CM | POA: Insufficient documentation

## 2022-04-13 DIAGNOSIS — R7989 Other specified abnormal findings of blood chemistry: Secondary | ICD-10-CM | POA: Insufficient documentation

## 2022-04-13 DIAGNOSIS — E119 Type 2 diabetes mellitus without complications: Secondary | ICD-10-CM | POA: Insufficient documentation

## 2022-04-13 DIAGNOSIS — R011 Cardiac murmur, unspecified: Secondary | ICD-10-CM | POA: Diagnosis not present

## 2022-04-13 DIAGNOSIS — I10 Essential (primary) hypertension: Secondary | ICD-10-CM

## 2022-04-13 LAB — ECHOCARDIOGRAM COMPLETE
AR max vel: 1.83 cm2
AV Area VTI: 1.96 cm2
AV Area mean vel: 1.89 cm2
AV Mean grad: 11 mmHg
AV Peak grad: 18.4 mmHg
Ao pk vel: 2.15 m/s
Area-P 1/2: 2.62 cm2
P 1/2 time: 327 msec
S' Lateral: 2.6 cm
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
## 2022-04-17 ENCOUNTER — Encounter: Payer: Self-pay | Admitting: *Deleted

## 2022-04-25 ENCOUNTER — Ambulatory Visit: Payer: Medicare (Managed Care) | Attending: Cardiology | Admitting: Cardiology

## 2022-04-25 ENCOUNTER — Encounter: Payer: Self-pay | Admitting: Cardiology

## 2022-04-25 VITALS — BP 136/88 | HR 67 | Ht 64.0 in | Wt 290.0 lb

## 2022-04-25 DIAGNOSIS — R0609 Other forms of dyspnea: Secondary | ICD-10-CM

## 2022-04-25 DIAGNOSIS — I10 Essential (primary) hypertension: Secondary | ICD-10-CM | POA: Diagnosis not present

## 2022-04-25 DIAGNOSIS — I359 Nonrheumatic aortic valve disorder, unspecified: Secondary | ICD-10-CM

## 2022-04-25 DIAGNOSIS — E7800 Pure hypercholesterolemia, unspecified: Secondary | ICD-10-CM

## 2022-04-25 MED ORDER — METOPROLOL TARTRATE 50 MG PO TABS: ORAL_TABLET | ORAL | 0 refills | Status: DC

## 2022-04-25 NOTE — Patient Instructions (Signed)
  Testing/Procedures:    Your cardiac CT will be scheduled at   [facility] [address] [place], [place] [address] [phone]    If scheduled at [facility], please arrive at the [facility]'s Entrance (Entrance C2) of [facility] 30 minutes prior to test start time. You can use the FREE valet parking offered at entrance C (encouraged to control the heart rate for the test)  Proceed to the [facility] Radiology Department (first floor) to check-in and test prep.  All radiology patients and guests should use entrance C2 at [facility], accessed from [facility], even though the hospital's physical address listed is [address]      Please follow these instructions carefully (unless otherwise directed):    On the Night Before the Test: Be sure to Drink plenty of water. Do not consume any caffeinated/decaffeinated beverages or chocolate 12 hours prior to your test. Do not take any antihistamines 12 hours prior to your test.   On the Day of the Test: Drink plenty of water until 1 hour prior to the test. Do not eat any food 1 hour prior to test. You may take your regular medications prior to the test.  Take metoprolol (Lopressor) 50 mg two hours prior to test. DO NOT TAKE FUROSEMIDE THE MORNING OF THE SCAN FEMALES- please wear underwire-free bra if available, avoid dresses & tight clothing       After the Test: Drink plenty of water. After receiving IV contrast, you may experience a mild flushed feeling. This is normal. On occasion, you may experience a mild rash up to 24 hours after the test. This is not dangerous. If this occurs, you can take Benadryl 25 mg and increase your fluid intake. If you experience trouble breathing, this can be serious. If it is severe call 911 IMMEDIATELY. If it is mild, please call our office. DO NOT TAKE METFORMIN THE DAY OF THE PROCEDURE AND 2 DAYS AFTER THE PROCEDURE  -RESTART THE 3RD DAY  We will call to schedule your test 2-4 weeks out understanding that some insurance companies will need an authorization prior to the service being performed.   For non-scheduling related questions, please contact the cardiac imaging nurse navigator should you have any questions/concerns: Rockwell Alexandria, Cardiac Imaging Nurse Navigator Larey Brick, Cardiac Imaging Nurse Navigator [facility] Direct Office Dial: [phone]   For scheduling needs, including cancellations and rescheduling, please call [place], [phone].    Follow-Up: At [facility], you and your health needs are our priority.  As part of our continuing mission to provide you with exceptional heart care, we have created designated Provider Care Teams.  These Care Teams include your primary Cardiologist (physician) and Advanced Practice Providers (APPs -  Physician Assistants and Nurse Practitioners) who all work together to provide you with the care you need, when you need it.  We recommend signing up for the patient portal called "MyChart".  Sign up information is provided on this After Visit Summary.  MyChart is used to connect with patients for Virtual Visits (Telemedicine).  Patients are able to view lab/test results, encounter notes, upcoming appointments, etc.  Non-urgent messages can be sent to your provider as well.   To learn more about what you can do with MyChart, go to [email or web address].    Your next appointment:   12 month(s)  Provider:   Olga Millers MD

## 2022-05-01 ENCOUNTER — Telehealth (HOSPITAL_COMMUNITY): Payer: Self-pay | Admitting: Emergency Medicine

## 2022-05-01 NOTE — Telephone Encounter (Signed)
Reaching out to patient to offer assistance regarding upcoming cardiac imaging study; pt verbalizes understanding of appt date/time, parking situation and where to check in, pre-test NPO status and medications ordered, and verified current allergies; name and call back number provided for further questions should they arise Virginia Alexandria RN Navigator Cardiac Imaging [name] [phone] office [phone] cell  Arrival 1230 50mg  metoprolol + 200mg  labetolol Denies iv issues Aware contrast/nitro No metal to chest Needs istat creat since theres no BMP on file.

## 2022-05-02 ENCOUNTER — Other Ambulatory Visit: Payer: Self-pay | Admitting: Cardiology

## 2022-05-02 ENCOUNTER — Ambulatory Visit (HOSPITAL_COMMUNITY)
Admission: RE | Admit: 2022-05-02 | Discharge: 2022-05-02 | Disposition: A | Discharge status: Home / Self Care | Payer: Medicare (Managed Care) | Source: Ambulatory Visit | Attending: Cardiology | Admitting: Cardiology

## 2022-05-02 DIAGNOSIS — R931 Abnormal findings on diagnostic imaging of heart and coronary circulation: Secondary | ICD-10-CM | POA: Diagnosis not present

## 2022-05-02 DIAGNOSIS — R0609 Other forms of dyspnea: Secondary | ICD-10-CM | POA: Diagnosis present

## 2022-05-02 DIAGNOSIS — I358 Other nonrheumatic aortic valve disorders: Secondary | ICD-10-CM | POA: Insufficient documentation

## 2022-05-02 DIAGNOSIS — I700 Atherosclerosis of aorta: Secondary | ICD-10-CM | POA: Diagnosis not present

## 2022-05-02 DIAGNOSIS — I2510 Atherosclerotic heart disease of native coronary artery without angina pectoris: Secondary | ICD-10-CM | POA: Diagnosis not present

## 2022-05-02 LAB — POCT I-STAT CREATININE: Creatinine, Ser: 0.9 mg/dL (ref 0.44–1.00)

## 2022-05-02 MED ORDER — NITROGLYCERIN 0.4 MG SL SUBL
0.8000 mg | SUBLINGUAL_TABLET | Freq: Once | SUBLINGUAL | Status: AC
  Administered 2022-05-02: 0.8 mg via SUBLINGUAL

## 2022-05-02 MED ORDER — IOHEXOL 350 MG/ML SOLN
115.0000 mL | Freq: Once | INTRAVENOUS | Status: AC | PRN
  Administered 2022-05-02: 115 mL via INTRAVENOUS

## 2022-05-02 MED ORDER — NITROGLYCERIN 0.4 MG SL SUBL
SUBLINGUAL_TABLET | SUBLINGUAL | Status: AC
  Filled 2022-05-02: qty 2

## 2022-05-02 NOTE — Progress Notes (Signed)
Pt. States, "I feel fine." No symptoms present.

## 2022-05-03 ENCOUNTER — Telehealth: Payer: Self-pay | Admitting: *Deleted

## 2022-05-03 MED ORDER — CLOPIDOGREL BISULFATE 75 MG PO TABS: 75.0000 mg | ORAL_TABLET | Freq: Every day | ORAL | 3 refills | Status: DC

## 2022-05-03 NOTE — Telephone Encounter (Signed)
-----   Message from Lewayne Bunting, MD sent at 05/03/2022  7:34 AM EDT ----- Add plavix 75 mg daily (pt allergic to ASA); schedule APPov; will need to arrange cath Olga Millers

## 2022-05-03 NOTE — Telephone Encounter (Signed)
Spoke with pt, aware of results.  New script sent to the pharmacy  Follow up scheduled  
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
## 2022-05-09 ENCOUNTER — Other Ambulatory Visit: Payer: Self-pay

## 2022-05-09 MED ORDER — CLOPIDOGREL BISULFATE 75 MG PO TABS: 75.0000 mg | ORAL_TABLET | Freq: Every day | ORAL | 3 refills | Status: DC

## 2022-05-11 ENCOUNTER — Ambulatory Visit: Payer: Medicare (Managed Care) | Attending: Student | Admitting: Student

## 2022-05-11 ENCOUNTER — Encounter: Payer: Self-pay | Admitting: Student

## 2022-05-11 VITALS — BP 138/78 | HR 66 | Ht 64.0 in | Wt 294.0 lb

## 2022-05-11 DIAGNOSIS — R0789 Other chest pain: Secondary | ICD-10-CM | POA: Diagnosis not present

## 2022-05-11 DIAGNOSIS — I350 Nonrheumatic aortic (valve) stenosis: Secondary | ICD-10-CM | POA: Diagnosis not present

## 2022-05-11 DIAGNOSIS — Z7985 Long-term (current) use of injectable non-insulin antidiabetic drugs: Secondary | ICD-10-CM

## 2022-05-11 DIAGNOSIS — R0609 Other forms of dyspnea: Secondary | ICD-10-CM

## 2022-05-11 DIAGNOSIS — I351 Nonrheumatic aortic (valve) insufficiency: Secondary | ICD-10-CM

## 2022-05-11 DIAGNOSIS — E785 Hyperlipidemia, unspecified: Secondary | ICD-10-CM

## 2022-05-11 DIAGNOSIS — I10 Essential (primary) hypertension: Secondary | ICD-10-CM

## 2022-05-11 DIAGNOSIS — E118 Type 2 diabetes mellitus with unspecified complications: Secondary | ICD-10-CM

## 2022-05-11 DIAGNOSIS — I25118 Atherosclerotic heart disease of native coronary artery with other forms of angina pectoris: Secondary | ICD-10-CM | POA: Diagnosis not present

## 2022-05-11 DIAGNOSIS — Z01812 Encounter for preprocedural laboratory examination: Secondary | ICD-10-CM

## 2022-05-11 LAB — BASIC METABOLIC PANEL

## 2022-05-11 LAB — CBC
Hemoglobin: 12.8 g/dL (ref 11.1–15.9)
MCHC: 30.6 g/dL — ABNORMAL LOW (ref 31.5–35.7)
WBC: 6.8 10*3/uL (ref 3.4–10.8)

## 2022-05-11 NOTE — Patient Instructions (Addendum)
Medication Instructions:   HOLD Glimerpiride (Amaryl) on the day of the procedure and Restart after procedure.   *If you need a refill on your cardiac medications before your next appointment, please call your pharmacy*  Lab Work: Virginia Skiff, PA-C recommends that you have lab work TODAY:  BMP CBC  If you have labs (blood work) drawn today and your tests are completely normal, you will receive your results only by: MyChart Message (if you have MyChart) OR A paper copy in the mail If you have any lab test that is abnormal or we need to change your treatment, we will call you to review the results.  Testing/Procedures: Virginia Skiff, PA-C has requested that you have a cardiac catheterization. Cardiac catheterization is used to diagnose and/or treat various heart conditions. Doctors may recommend this procedure for a number of different reasons. The most common reason is to evaluate chest pain. Chest pain can be a symptom of coronary artery disease (CAD), and cardiac catheterization can show whether plaque is narrowing or blocking your heart's arteries. This procedure is also used to evaluate the valves, as well as measure the blood flow and oxygen levels in different parts of your heart. For further information please visit [email or web address] Please follow instruction sheet, as given.  Follow-Up: At [facility], you and your health needs are our priority.  As part of our continuing mission to provide you with exceptional heart care, we have created designated Provider Care Teams.  These Care Teams include your primary Cardiologist (physician) and Advanced Practice Providers (APPs -  Physician Assistants and Nurse Practitioners) who all work together to provide you with the care you need, when you need it.  Your next appointment:   2-3 week(s) post heart cath   Provider:   Marjie Skiff, PA-C or APP        Other Instructions        Cardiac/Peripheral  Catheterization   You are scheduled for a Cardiac Catheterization on Tuesday, May 14 with Dr. Nicki Guadalajara.  1. Please arrive at the [facility] (Main Entrance A) at [facility]: [address] [place], [place] [address] at 7:00 AM (This time is 2 hour(s) before your procedure to ensure your preparation). Free valet parking service is available. You will check in at ADMITTING. The support person will be asked to wait in the waiting room.  It is OK to have someone drop you off and come back when you are ready to be discharged.        Special note: Every effort is made to have your procedure done on time. Please understand that emergencies sometimes delay scheduled procedures.  2. Diet: Do not eat solid foods after midnight.  You may have clear liquids until 5 AM the day of the procedure.  3. Labs: You will need to have blood drawn on Thursday, May 9 at [facility] [address], [place]  Open: 8am - 5pm (Lunch 12:30 - 1:30)   Phone: [phone]. You do not need to be fasting.  4. Medication instructions in preparation for your procedure:   Contrast Allergy: No   Stop taking, Voltaren or Cataflam or Arthrotec (Diclofenac) Tuesday, May 14,  Do not take Glimerpiride (Amaryl) on the day of the procedure and Restart after procedure.   On the morning of your procedure, take Plavix/Clopidogrel and any morning medicines NOT listed above.  You may use sips of water.  5. Plan to go home the same day, you will only  stay overnight if medically necessary. 6. You MUST have a responsible adult to drive you home. 7. An adult MUST be with you the first 24 hours after you arrive home. 8. Bring a current list of your medications, and the last time and date medication taken. 9. Bring ID and current insurance cards. 10.Please wear clothes that are easy to get on and off and wear slip-on shoes.  Thank you for allowing [place] to care for you!   -- [place] [facility]

## 2022-05-12 ENCOUNTER — Encounter: Payer: Self-pay | Admitting: Student

## 2022-05-12 LAB — BASIC METABOLIC PANEL
BUN/Creatinine Ratio: 17 (ref 12–28)
Chloride: 105 mmol/L (ref 96–106)
Creatinine, Ser: 1.09 mg/dL — ABNORMAL HIGH (ref 0.57–1.00)
Glucose: 74 mg/dL (ref 70–99)
Sodium: 144 mmol/L (ref 134–144)
eGFR: 53 mL/min/{1.73_m2} — ABNORMAL LOW (ref >59)

## 2022-05-12 LAB — CBC
Hematocrit: 41.8 % (ref 34.0–46.6)
MCH: 22.2 pg — ABNORMAL LOW (ref 26.6–33.0)
MCV: 73 fL — ABNORMAL LOW (ref 79–97)
Platelets: 250 10*3/uL (ref 150–450)
RBC: 5.76 x10E6/uL — ABNORMAL HIGH (ref 3.77–5.28)
RDW: 18.8 % — ABNORMAL HIGH (ref 11.7–15.4)

## 2022-05-12 MED ORDER — NITROGLYCERIN 0.4 MG SL SUBL: 0.4000 mg | SUBLINGUAL_TABLET | SUBLINGUAL | 2 refills | Status: AC | PRN

## 2022-05-15 ENCOUNTER — Telehealth: Payer: Self-pay | Admitting: *Deleted

## 2022-05-15 NOTE — Telephone Encounter (Addendum)
Cardiac Catheterization scheduled at [facility] for: Tuesday May 16, 2022 9 AM Arrival time [facility] Main Entrance A at: 7 AM  Nothing to eat after midnight prior to procedure, clear liquids until 5 AM day of procedure.  Medication instructions: -Hold:  Lasix-day before and day of procedure-per protocol GFR 53  Glipizide -AM of procedure -Other usual morning medications can be taken with sips of water including Plavix 75 mg.  Patient reports she cannot take aspirin.  Patient confirmed Ozempic weekly on Wednesdays.  Confirmed patient has responsible adult to drive home post procedure and be with patient first 24 hours after arriving home.  Plan to go home the same day, you will only stay overnight if medically necessary.  Reviewed procedure instructions with patient.

## 2022-05-16 ENCOUNTER — Encounter (HOSPITAL_COMMUNITY)
Admission: RE | Disposition: A | Discharge status: Home / Self Care | Payer: Self-pay | Source: Home / Self Care | Attending: Cardiovascular Disease

## 2022-05-16 ENCOUNTER — Encounter (HOSPITAL_COMMUNITY): Payer: Self-pay | Admitting: Cardiovascular Disease

## 2022-05-16 ENCOUNTER — Ambulatory Visit (HOSPITAL_COMMUNITY)
Admission: RE | Admit: 2022-05-16 | Discharge: 2022-05-18 | Disposition: A | Discharge status: Home / Self Care | Payer: Medicare (Managed Care) | Attending: Cardiology | Admitting: Cardiology

## 2022-05-16 DIAGNOSIS — Z833 Family history of diabetes mellitus: Secondary | ICD-10-CM | POA: Diagnosis not present

## 2022-05-16 DIAGNOSIS — I352 Nonrheumatic aortic (valve) stenosis with insufficiency: Secondary | ICD-10-CM | POA: Insufficient documentation

## 2022-05-16 DIAGNOSIS — I2510 Atherosclerotic heart disease of native coronary artery without angina pectoris: Secondary | ICD-10-CM | POA: Diagnosis present

## 2022-05-16 DIAGNOSIS — Z7984 Long term (current) use of oral hypoglycemic drugs: Secondary | ICD-10-CM | POA: Insufficient documentation

## 2022-05-16 DIAGNOSIS — E785 Hyperlipidemia, unspecified: Secondary | ICD-10-CM | POA: Insufficient documentation

## 2022-05-16 DIAGNOSIS — Z6841 Body Mass Index (BMI) 40.0 and over, adult: Secondary | ICD-10-CM | POA: Diagnosis not present

## 2022-05-16 DIAGNOSIS — Z79899 Other long term (current) drug therapy: Secondary | ICD-10-CM | POA: Diagnosis not present

## 2022-05-16 DIAGNOSIS — Z8249 Family history of ischemic heart disease and other diseases of the circulatory system: Secondary | ICD-10-CM | POA: Insufficient documentation

## 2022-05-16 DIAGNOSIS — I25118 Atherosclerotic heart disease of native coronary artery with other forms of angina pectoris: Secondary | ICD-10-CM | POA: Insufficient documentation

## 2022-05-16 DIAGNOSIS — E118 Type 2 diabetes mellitus with unspecified complications: Secondary | ICD-10-CM

## 2022-05-16 DIAGNOSIS — E119 Type 2 diabetes mellitus without complications: Secondary | ICD-10-CM | POA: Diagnosis not present

## 2022-05-16 DIAGNOSIS — Z955 Presence of coronary angioplasty implant and graft: Secondary | ICD-10-CM

## 2022-05-16 DIAGNOSIS — Z7902 Long term (current) use of antithrombotics/antiplatelets: Secondary | ICD-10-CM | POA: Insufficient documentation

## 2022-05-16 DIAGNOSIS — I119 Hypertensive heart disease without heart failure: Secondary | ICD-10-CM | POA: Insufficient documentation

## 2022-05-16 DIAGNOSIS — Z9884 Bariatric surgery status: Secondary | ICD-10-CM | POA: Insufficient documentation

## 2022-05-16 DIAGNOSIS — R0602 Shortness of breath: Secondary | ICD-10-CM

## 2022-05-16 DIAGNOSIS — I2584 Coronary atherosclerosis due to calcified coronary lesion: Secondary | ICD-10-CM | POA: Insufficient documentation

## 2022-05-16 DIAGNOSIS — R931 Abnormal findings on diagnostic imaging of heart and coronary circulation: Secondary | ICD-10-CM | POA: Diagnosis not present

## 2022-05-16 HISTORY — PX: LEFT HEART CATH AND CORONARY ANGIOGRAPHY: CATH118249

## 2022-05-16 LAB — GLUCOSE, CAPILLARY
Glucose-Capillary: 110 mg/dL — ABNORMAL HIGH (ref 70–99)
Glucose-Capillary: 192 mg/dL — ABNORMAL HIGH (ref 70–99)
Glucose-Capillary: 225 mg/dL — ABNORMAL HIGH (ref 70–99)
Glucose-Capillary: 96 mg/dL (ref 70–99)

## 2022-05-16 SURGERY — LEFT HEART CATH AND CORONARY ANGIOGRAPHY
Anesthesia: LOCAL

## 2022-05-16 MED ORDER — FENTANYL CITRATE (PF) 100 MCG/2ML IJ SOLN
INTRAMUSCULAR | Status: AC
  Filled 2022-05-16: qty 2

## 2022-05-16 MED ORDER — HEPARIN SODIUM (PORCINE) 1000 UNIT/ML IJ SOLN
INTRAMUSCULAR | Status: DC | PRN
  Administered 2022-05-16: 6500 [IU] via INTRAVENOUS

## 2022-05-16 MED ORDER — SODIUM CHLORIDE 0.9% FLUSH
3.0000 mL | Freq: Two times a day (BID) | INTRAVENOUS | Status: DC
  Administered 2022-05-17: 3 mL via INTRAVENOUS

## 2022-05-16 MED ORDER — ASPIRIN 81 MG PO CHEW
CHEWABLE_TABLET | ORAL | Status: AC
  Administered 2022-05-16: 81 mg
  Filled 2022-05-16: qty 1

## 2022-05-16 MED ORDER — ATORVASTATIN CALCIUM 80 MG PO TABS
80.0000 mg | ORAL_TABLET | Freq: Every day | ORAL | Status: DC
  Administered 2022-05-16: 80 mg via ORAL
  Filled 2022-05-16: qty 1

## 2022-05-16 MED ORDER — LIDOCAINE HCL (PF) 1 % IJ SOLN
INTRAMUSCULAR | Status: DC | PRN
  Administered 2022-05-16: 5 mL

## 2022-05-16 MED ORDER — MIDAZOLAM HCL 2 MG/2ML IJ SOLN
INTRAMUSCULAR | Status: AC
  Filled 2022-05-16: qty 2

## 2022-05-16 MED ORDER — MIDAZOLAM HCL 2 MG/2ML IJ SOLN
INTRAMUSCULAR | Status: DC | PRN
  Administered 2022-05-16: 2 mg via INTRAVENOUS

## 2022-05-16 MED ORDER — ONDANSETRON HCL 4 MG/2ML IJ SOLN: 4.0000 mg | Freq: Four times a day (QID) | INTRAMUSCULAR | Status: DC | PRN

## 2022-05-16 MED ORDER — HYDRALAZINE HCL 20 MG/ML IJ SOLN: 10.0000 mg | INTRAMUSCULAR | Status: AC | PRN

## 2022-05-16 MED ORDER — LIDOCAINE HCL (PF) 1 % IJ SOLN
INTRAMUSCULAR | Status: AC
  Filled 2022-05-16: qty 30

## 2022-05-16 MED ORDER — LABETALOL HCL 5 MG/ML IV SOLN: 10.0000 mg | INTRAVENOUS | Status: AC | PRN

## 2022-05-16 MED ORDER — ALUM & MAG HYDROXIDE-SIMETH 200-200-20 MG/5ML PO SUSP
30.0000 mL | ORAL | Status: DC | PRN
  Administered 2022-05-16: 30 mL via ORAL
  Filled 2022-05-16: qty 30

## 2022-05-16 MED ORDER — ROSUVASTATIN CALCIUM 20 MG PO TABS: 40.0000 mg | ORAL_TABLET | Freq: Every evening | ORAL | Status: DC

## 2022-05-16 MED ORDER — INSULIN ASPART 100 UNIT/ML IJ SOLN
0.0000 [IU] | Freq: Three times a day (TID) | INTRAMUSCULAR | Status: DC
  Administered 2022-05-18: 2 [IU] via SUBCUTANEOUS

## 2022-05-16 MED ORDER — VERAPAMIL HCL 2.5 MG/ML IV SOLN
INTRAVENOUS | Status: AC
  Filled 2022-05-16: qty 2

## 2022-05-16 MED ORDER — SODIUM CHLORIDE 0.9% FLUSH: 3.0000 mL | INTRAVENOUS | Status: DC | PRN

## 2022-05-16 MED ORDER — SODIUM CHLORIDE 0.9 % IV SOLN: 250.0000 mL | INTRAVENOUS | Status: DC | PRN

## 2022-05-16 MED ORDER — LABETALOL HCL 200 MG PO TABS
400.0000 mg | ORAL_TABLET | Freq: Two times a day (BID) | ORAL | Status: DC
  Administered 2022-05-16 – 2022-05-18 (×4): 400 mg via ORAL
  Filled 2022-05-16 (×4): qty 2

## 2022-05-16 MED ORDER — SODIUM CHLORIDE 0.9 % WEIGHT BASED INFUSION: 1.0000 mL/kg/h | INTRAVENOUS | Status: DC

## 2022-05-16 MED ORDER — ACETAMINOPHEN 325 MG PO TABS
650.0000 mg | ORAL_TABLET | ORAL | Status: DC | PRN
  Filled 2022-05-16: qty 2

## 2022-05-16 MED ORDER — CLOPIDOGREL BISULFATE 75 MG PO TABS
75.0000 mg | ORAL_TABLET | Freq: Every day | ORAL | Status: DC
  Administered 2022-05-17 – 2022-05-18 (×2): 75 mg via ORAL
  Filled 2022-05-16 (×2): qty 1

## 2022-05-16 MED ORDER — HEPARIN (PORCINE) 25000 UT/250ML-% IV SOLN
1500.0000 [IU]/h | INTRAVENOUS | Status: DC
  Administered 2022-05-16: 1200 [IU]/h via INTRAVENOUS
  Filled 2022-05-16: qty 250

## 2022-05-16 MED ORDER — FENTANYL CITRATE (PF) 100 MCG/2ML IJ SOLN
INTRAMUSCULAR | Status: DC | PRN
  Administered 2022-05-16: 25 ug via INTRAVENOUS

## 2022-05-16 MED ORDER — VERAPAMIL HCL 2.5 MG/ML IV SOLN
INTRAVENOUS | Status: DC | PRN
  Administered 2022-05-16 (×2): 10 mL via INTRA_ARTERIAL

## 2022-05-16 MED ORDER — HEPARIN SODIUM (PORCINE) 1000 UNIT/ML IJ SOLN
INTRAMUSCULAR | Status: AC
  Filled 2022-05-16: qty 10

## 2022-05-16 MED ORDER — AMLODIPINE BESYLATE 10 MG PO TABS
10.0000 mg | ORAL_TABLET | Freq: Every morning | ORAL | Status: DC
  Administered 2022-05-17 – 2022-05-18 (×2): 10 mg via ORAL
  Filled 2022-05-16 (×2): qty 1

## 2022-05-16 MED ORDER — SODIUM CHLORIDE 0.9 % WEIGHT BASED INFUSION
3.0000 mL/kg/h | INTRAVENOUS | Status: DC
  Administered 2022-05-16: 3 mL/kg/h via INTRAVENOUS

## 2022-05-16 MED ORDER — HEPARIN (PORCINE) IN NACL 1000-0.9 UT/500ML-% IV SOLN
INTRAVENOUS | Status: DC | PRN
  Administered 2022-05-16: 1000 mL

## 2022-05-16 MED ORDER — IOHEXOL 350 MG/ML SOLN
INTRAVENOUS | Status: DC | PRN
  Administered 2022-05-16: 175 mL

## 2022-05-16 MED ORDER — ASPIRIN 81 MG PO CHEW
81.0000 mg | CHEWABLE_TABLET | Freq: Every day | ORAL | Status: DC
  Administered 2022-05-17 – 2022-05-18 (×2): 81 mg via ORAL
  Filled 2022-05-16 (×4): qty 1

## 2022-05-16 MED ORDER — SODIUM CHLORIDE 0.9 % IV SOLN
INTRAVENOUS | Status: DC
  Administered 2022-05-16: 130 mL/h via INTRAVENOUS

## 2022-05-16 SURGICAL SUPPLY — 15 items
CATH INFINITI 5 FR 3DRC (CATHETERS) IMPLANT
CATH INFINITI JR4 5F (CATHETERS) IMPLANT
CATH LAUNCHER 5F AR1 (CATHETERS) IMPLANT
CATH LAUNCHER 5F JR4 (CATHETERS) IMPLANT
CATH LAUNCHER 5F RADR (CATHETERS) IMPLANT
CATHETER LAUNCHER 5F RADR (CATHETERS) ×1
DEVICE RAD COMP TR BAND LRG (VASCULAR PRODUCTS) IMPLANT
GLIDESHEATH SLEND SS 6F .021 (SHEATH) IMPLANT
GUIDEWIRE INQWIRE 1.5J.035X260 (WIRE) IMPLANT
INQWIRE 1.5J .035X260CM (WIRE) ×1
KIT HEART LEFT (KITS) ×2 IMPLANT
PACK CARDIAC CATHETERIZATION (CUSTOM PROCEDURE TRAY) ×2 IMPLANT
SHEATH PROBE COVER 6X72 (BAG) IMPLANT
TRANSDUCER W/STOPCOCK (MISCELLANEOUS) ×2 IMPLANT
TUBING CIL FLEX 10 FLL-RA (TUBING) ×2 IMPLANT
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
## 2022-05-16 NOTE — Interval H&P Note (Signed)
Cath Lab Visit (complete for each Cath Lab visit)  Clinical Evaluation Leading to the Procedure:   ACS: No.  Non-ACS:    Anginal Classification: CCS II  Anti-ischemic medical therapy: Minimal Therapy (1 class of medications)  Non-Invasive Test Results: Intermediate-risk stress test findings: cardiac mortality 1-3%/year  Prior CABG: No previous CABG      History and Physical Interval Note:  05/16/2022 10:39 AM  Virginia Flores  has presented today for surgery, with the diagnosis of DOE , abnormal CTA.  The various methods of treatment have been discussed with the patient and family. After consideration of risks, benefits and other options for treatment, the patient has consented to  Procedure(s): LEFT HEART CATH AND CORONARY ANGIOGRAPHY (N/A) as a surgical intervention.  The patient's history has been reviewed, patient examined, no change in status, stable for surgery.  I have reviewed the patient's chart and labs.  Questions were answered to the patient's satisfaction.     Nicki Guadalajara

## 2022-05-17 ENCOUNTER — Ambulatory Visit (HOSPITAL_COMMUNITY)
Admission: RE | Disposition: A | Discharge status: Home / Self Care | Payer: Self-pay | Source: Home / Self Care | Attending: Cardiovascular Disease

## 2022-05-17 ENCOUNTER — Other Ambulatory Visit: Payer: Self-pay

## 2022-05-17 ENCOUNTER — Encounter (HOSPITAL_COMMUNITY): Payer: Self-pay | Admitting: Cardiovascular Disease

## 2022-05-17 ENCOUNTER — Telehealth (HOSPITAL_COMMUNITY): Payer: Self-pay | Admitting: Pharmacy Technician

## 2022-05-17 ENCOUNTER — Other Ambulatory Visit (HOSPITAL_COMMUNITY): Payer: Self-pay

## 2022-05-17 DIAGNOSIS — R931 Abnormal findings on diagnostic imaging of heart and coronary circulation: Secondary | ICD-10-CM | POA: Diagnosis not present

## 2022-05-17 DIAGNOSIS — I119 Hypertensive heart disease without heart failure: Secondary | ICD-10-CM | POA: Diagnosis not present

## 2022-05-17 DIAGNOSIS — I25118 Atherosclerotic heart disease of native coronary artery with other forms of angina pectoris: Secondary | ICD-10-CM

## 2022-05-17 DIAGNOSIS — I2510 Atherosclerotic heart disease of native coronary artery without angina pectoris: Secondary | ICD-10-CM | POA: Diagnosis not present

## 2022-05-17 DIAGNOSIS — I2584 Coronary atherosclerosis due to calcified coronary lesion: Secondary | ICD-10-CM | POA: Diagnosis not present

## 2022-05-17 DIAGNOSIS — E785 Hyperlipidemia, unspecified: Secondary | ICD-10-CM | POA: Diagnosis not present

## 2022-05-17 HISTORY — PX: CORONARY ULTRASOUND/IVUS: CATH118244

## 2022-05-17 HISTORY — PX: CORONARY STENT INTERVENTION: CATH118234

## 2022-05-17 HISTORY — PX: CORONARY ATHERECTOMY: CATH118238

## 2022-05-17 LAB — HEPARIN LEVEL (UNFRACTIONATED)
Heparin Unfractionated: 0.14 IU/mL — ABNORMAL LOW (ref 0.30–0.70)
Heparin Unfractionated: 0.38 IU/mL (ref 0.30–0.70)

## 2022-05-17 LAB — GLUCOSE, CAPILLARY
Glucose-Capillary: 104 mg/dL — ABNORMAL HIGH (ref 70–99)
Glucose-Capillary: 110 mg/dL — ABNORMAL HIGH (ref 70–99)
Glucose-Capillary: 130 mg/dL — ABNORMAL HIGH (ref 70–99)
Glucose-Capillary: 161 mg/dL — ABNORMAL HIGH (ref 70–99)

## 2022-05-17 LAB — POCT ACTIVATED CLOTTING TIME
Activated Clotting Time: 244 seconds
Activated Clotting Time: 336 seconds
Activated Clotting Time: 287 seconds
Activated Clotting Time: 196 seconds

## 2022-05-17 LAB — CBC
HCT: 38.3 % (ref 36.0–46.0)
Hemoglobin: 11.7 g/dL — ABNORMAL LOW (ref 12.0–15.0)
MCH: 21.8 pg — ABNORMAL LOW (ref 26.0–34.0)
MCHC: 30.5 g/dL (ref 30.0–36.0)
MCV: 71.5 fL — ABNORMAL LOW (ref 80.0–100.0)
Platelets: 229 10*3/uL (ref 150–400)
RBC: 5.36 MIL/uL — ABNORMAL HIGH (ref 3.87–5.11)
RDW: 18.8 % — ABNORMAL HIGH (ref 11.5–15.5)
WBC: 5.8 10*3/uL (ref 4.0–10.5)
nRBC: 0 % (ref 0.0–0.2)

## 2022-05-17 LAB — BASIC METABOLIC PANEL
Anion gap: 5 (ref 5–15)
BUN: 10 mg/dL (ref 8–23)
CO2: 25 mmol/L (ref 22–32)
Calcium: 8.1 mg/dL — ABNORMAL LOW (ref 8.9–10.3)
Chloride: 109 mmol/L (ref 98–111)
Creatinine, Ser: 0.85 mg/dL (ref 0.44–1.00)
GFR, Estimated: >60 mL/min (ref >60)
Glucose, Bld: 115 mg/dL — ABNORMAL HIGH (ref 70–99)
Potassium: 3.6 mmol/L (ref 3.5–5.1)
Sodium: 139 mmol/L (ref 135–145)

## 2022-05-17 SURGERY — CORONARY STENT INTERVENTION
Anesthesia: LOCAL

## 2022-05-17 MED ORDER — INSULIN ASPART 100 UNIT/ML IJ SOLN
0.0000 [IU] | Freq: Every day | INTRAMUSCULAR | Status: DC
Start: 2022-05-17 — End: 2022-05-17

## 2022-05-17 MED ORDER — ATROPINE SULFATE 1 MG/10ML IJ SOSY
PREFILLED_SYRINGE | INTRAMUSCULAR | Status: AC
  Filled 2022-05-17: qty 10

## 2022-05-17 MED ORDER — SODIUM CHLORIDE 0.9 % IV SOLN: 250.0000 mL | INTRAVENOUS | Status: DC | PRN

## 2022-05-17 MED ORDER — INSULIN ASPART 100 UNIT/ML IJ SOLN
0.0000 [IU] | Freq: Once | INTRAMUSCULAR | Status: AC
  Administered 2022-05-17: 2 [IU] via SUBCUTANEOUS

## 2022-05-17 MED ORDER — SODIUM CHLORIDE 0.9 % WEIGHT BASED INFUSION
3.0000 mL/kg/h | INTRAVENOUS | Status: DC
  Administered 2022-05-17: 3 mL/kg/h via INTRAVENOUS

## 2022-05-17 MED ORDER — LIDOCAINE HCL (PF) 1 % IJ SOLN
INTRAMUSCULAR | Status: DC | PRN
  Administered 2022-05-17: 15 mL

## 2022-05-17 MED ORDER — MIDAZOLAM HCL 2 MG/2ML IJ SOLN
INTRAMUSCULAR | Status: AC
  Filled 2022-05-17: qty 2

## 2022-05-17 MED ORDER — SODIUM CHLORIDE 0.9% FLUSH: 3.0000 mL | INTRAVENOUS | Status: DC | PRN

## 2022-05-17 MED ORDER — SODIUM CHLORIDE 0.9 % IV SOLN: INTRAVENOUS | Status: AC

## 2022-05-17 MED ORDER — HEPARIN SODIUM (PORCINE) 1000 UNIT/ML IJ SOLN
INTRAMUSCULAR | Status: DC | PRN
  Administered 2022-05-17: 10000 [IU] via INTRAVENOUS
  Administered 2022-05-17: 4000 [IU] via INTRAVENOUS

## 2022-05-17 MED ORDER — SODIUM CHLORIDE 0.9 % IV SOLN
5.0000 mg/kg | Freq: Once | INTRAVENOUS | Status: DC
  Filled 2022-05-17: qty 26.3

## 2022-05-17 MED ORDER — LABETALOL HCL 5 MG/ML IV SOLN
10.0000 mg | INTRAVENOUS | Status: AC | PRN
  Administered 2022-05-17: 10 mg via INTRAVENOUS
  Filled 2022-05-17: qty 4

## 2022-05-17 MED ORDER — HYDRALAZINE HCL 20 MG/ML IJ SOLN: 10.0000 mg | INTRAMUSCULAR | Status: AC | PRN

## 2022-05-17 MED ORDER — IOHEXOL 350 MG/ML SOLN
INTRAVENOUS | Status: DC | PRN
  Administered 2022-05-17: 130 mL

## 2022-05-17 MED ORDER — VIPERSLIDE LUBRICANT OPTIME
TOPICAL | Status: DC | PRN
  Administered 2022-05-17: 20 mL via SURGICAL_CAVITY

## 2022-05-17 MED ORDER — SODIUM CHLORIDE 0.9 % WEIGHT BASED INFUSION: 1.0000 mL/kg/h | INTRAVENOUS | Status: DC

## 2022-05-17 MED ORDER — MORPHINE SULFATE (PF) 2 MG/ML IV SOLN
2.0000 mg | INTRAVENOUS | Status: DC | PRN
  Administered 2022-05-17: 2 mg via INTRAVENOUS
  Filled 2022-05-17 (×2): qty 1

## 2022-05-17 MED ORDER — NITROGLYCERIN 1 MG/10 ML FOR IR/CATH LAB
INTRA_ARTERIAL | Status: DC | PRN
  Administered 2022-05-17 (×2): 200 ug via INTRACORONARY

## 2022-05-17 MED ORDER — HEPARIN SODIUM (PORCINE) 1000 UNIT/ML IJ SOLN
INTRAMUSCULAR | Status: AC
  Filled 2022-05-17: qty 10

## 2022-05-17 MED ORDER — LIDOCAINE HCL (PF) 1 % IJ SOLN
INTRAMUSCULAR | Status: AC
  Filled 2022-05-17: qty 30

## 2022-05-17 MED ORDER — HEPARIN (PORCINE) IN NACL 1000-0.9 UT/500ML-% IV SOLN
INTRAVENOUS | Status: DC | PRN
  Administered 2022-05-17 (×2): 500 mL

## 2022-05-17 MED ORDER — SODIUM CHLORIDE 0.9% FLUSH: 3.0000 mL | Freq: Two times a day (BID) | INTRAVENOUS | Status: DC

## 2022-05-17 MED ORDER — FENTANYL CITRATE (PF) 100 MCG/2ML IJ SOLN
INTRAMUSCULAR | Status: DC | PRN
  Administered 2022-05-17 (×2): 50 ug via INTRAVENOUS

## 2022-05-17 MED ORDER — SODIUM CHLORIDE 0.9% FLUSH
3.0000 mL | Freq: Two times a day (BID) | INTRAVENOUS | Status: DC
  Administered 2022-05-17: 3 mL via INTRAVENOUS

## 2022-05-17 MED ORDER — NITROGLYCERIN 1 MG/10 ML FOR IR/CATH LAB
INTRA_ARTERIAL | Status: AC
  Filled 2022-05-17: qty 10

## 2022-05-17 MED ORDER — MIDAZOLAM HCL 2 MG/2ML IJ SOLN
INTRAMUSCULAR | Status: DC | PRN
  Administered 2022-05-17: 2 mg via INTRAVENOUS
  Administered 2022-05-17: 1 mg via INTRAVENOUS

## 2022-05-17 MED ORDER — FENTANYL CITRATE (PF) 100 MCG/2ML IJ SOLN
INTRAMUSCULAR | Status: AC
  Filled 2022-05-17: qty 2

## 2022-05-17 SURGICAL SUPPLY — 24 items
BALL SAPPHIRE NC24 4.5X10 (BALLOONS) ×1
BALLN SAPPHIRE 4.0X15 (BALLOONS) ×1
BALLOON SAPPHIRE 4.0X15 (BALLOONS) IMPLANT
BALLOON SAPPHIRE NC24 4.5X10 (BALLOONS) IMPLANT
CATH LAUNCHER 6FR AL.75 (CATHETERS) IMPLANT
CATH OPTICROSS HD (CATHETERS) IMPLANT
CATH TELEPORT (CATHETERS) IMPLANT
CROWN DIAMONDBACK CLASSIC 1.25 (BURR) IMPLANT
ELECT DEFIB PAD ADLT CADENCE (PAD) IMPLANT
KIT ENCORE 26 ADVANTAGE (KITS) IMPLANT
KIT HEART LEFT (KITS) ×2 IMPLANT
LUBRICANT VIPERSLIDE CORONARY (MISCELLANEOUS) IMPLANT
MAT PREVALON FULL STRYKER (MISCELLANEOUS) IMPLANT
PACK CARDIAC CATHETERIZATION (CUSTOM PROCEDURE TRAY) ×2 IMPLANT
SHEATH PINNACLE 6F 10CM (SHEATH) IMPLANT
SHEATH PROBE COVER 6X72 (BAG) IMPLANT
SLED PULL BACK IVUS (MISCELLANEOUS) IMPLANT
STENT MEGATRON 4.0X20 (Permanent Stent) IMPLANT
TRANSDUCER W/STOPCOCK (MISCELLANEOUS) ×2 IMPLANT
TUBING CIL FLEX 10 FLL-RA (TUBING) ×2 IMPLANT
WIRE ASAHI PROWATER 180CM (WIRE) IMPLANT
WIRE ASAHI PROWATER 300CM (WIRE) IMPLANT
WIRE EMERALD 3MM-J .035X150CM (WIRE) IMPLANT
WIRE VIPERWIRE COR FLEX .012 (WIRE) IMPLANT

## 2022-05-17 NOTE — H&P (View-Only) (Signed)
 Rounding Note    Patient Name: Virginia Flores Date of Encounter: 05/17/2022  [facility] Cardiologist: Brian Crenshaw, MD   Subjective   Feeling ok, no significant discomfort  Inpatient Medications    Scheduled Meds:  amLODipine  10 mg Oral q AM   aspirin  81 mg Oral Daily   clopidogrel  75 mg Oral Q breakfast   insulin aspart  0-15 Units Subcutaneous TID WC   labetalol  400 mg Oral BID   rosuvastatin  40 mg Oral QPM   sodium chloride flush  3 mL Intravenous Q12H   sodium chloride flush  3 mL Intravenous Q12H   sodium chloride flush  3 mL Intravenous Q12H   Continuous Infusions:  sodium chloride 100 mL/hr at 05/17/22 0154   sodium chloride     sodium chloride     sodium chloride 1 mL/kg/hr (05/17/22 0637)   heparin 1,500 Units/hr (05/17/22 0538)   PRN Meds: sodium chloride, sodium chloride, acetaminophen, alum & mag hydroxide-simeth, ondansetron (ZOFRAN) IV, sodium chloride flush, sodium chloride flush   Vital Signs    Vitals:   05/16/22 2106 05/16/22 2346 05/17/22 0519 05/17/22 0751  BP: (!) 149/73 125/64 127/81 (!) 142/79  Pulse: 72 73 66 78  Resp:  18    Temp:  98.8 F (37.1 C)  98.6 F (37 C)  TempSrc:  Oral  Oral  SpO2:  92% 93% 97%  Weight:      Height:        Intake/Output Summary (Last 24 hours) at 05/17/2022 0919 Last data filed at 05/17/2022 0154 Gross per 24 hour  Intake 1636.86 ml  Output 600 ml  Net 1036.86 ml      05/16/2022    7:14 AM 05/11/2022    2:16 PM 04/25/2022    2:19 PM  Last 3 Weights  Weight (lbs) 290 lb 294 lb 290 lb  Weight (kg) 131.543 kg 133.358 kg 131.543 kg      Telemetry    NSR without significant ventricular ectopy - Personally Reviewed  ECG    NSR without significant ST-T wave changes - Personally Reviewed  Physical Exam   GEN: No acute distress.   Neck: No JVD Cardiac: RRR, no murmurs, rubs, or gallops.  Respiratory: Clear to auscultation bilaterally. GI: Soft, nontender, non-distended  MS:  No edema; No deformity. Neuro:  Nonfocal  Psych: Normal affect   Labs    High Sensitivity Troponin:  No results for input(s): "TROPONINIHS" in the last 720 hours.   Chemistry Recent Labs  Lab 05/11/22 1532 05/17/22 0340  NA 144 139  K 4.7 3.6  CL 105 109  CO2 25 25  GLUCOSE 74 115*  BUN 18 10  CREATININE 1.09* 0.85  CALCIUM 9.4 8.1*  GFRNONAA  --  >60  ANIONGAP  --  5    Lipids No results for input(s): "CHOL", "TRIG", "HDL", "LABVLDL", "LDLCALC", "CHOLHDL" in the last 168 hours.  Hematology Recent Labs  Lab 05/11/22 1532 05/17/22 0340  WBC 6.8 5.8  RBC 5.76* 5.36*  HGB 12.8 11.7*  HCT 41.8 38.3  MCV 73* 71.5*  MCH 22.2* 21.8*  MCHC 30.6* 30.5  RDW 18.8* 18.8*  PLT 250 229   Thyroid No results for input(s): "TSH", "FREET4" in the last 168 hours.  BNPNo results for input(s): "BNP", "PROBNP" in the last 168 hours.  DDimer No results for input(s): "DDIMER" in the last 168 hours.   Radiology    CARDIAC CATHETERIZATION  Result Date:   05/16/2022   Prox LAD to Mid LAD lesion is 20% stenosed.   Mid LAD lesion is 20% stenosed.   1st Mrg lesion is 30% stenosed.   Ost RCA to Prox RCA lesion is 95% stenosed.   Ost LAD to Prox LAD lesion is 20% stenosed. 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 Eccentric 95% ostial stenosis of the right coronary artery. Mild 20% stenoses in the proximal and mid LAD and 30% in the left circumflex vessel. LVEDP 13 mmHg. RECOMMENDATION: Due to the significant difficulty in the radial approach to selectively engage the RCA, recommend PCI via the femoral approach.  Patient had been on clopidogrel therapy prior to the procedure.  Will need aggressive lipid-lowering therapy with target LDL less than 55.  Due to the high-grade ostial RCA stenosis will initiate heparinization 8 hours following TR band removal.    Cardiac Studies   Cath 05/16/2022   Prox LAD to Mid LAD lesion is 20% stenosed.   Mid LAD lesion is 20% stenosed.    1st Mrg lesion is 30% stenosed.   Ost RCA to Prox RCA lesion is 95% stenosed.   Ost LAD to Prox LAD lesion is 20% stenosed.   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   Eccentric 95% ostial stenosis of the right coronary artery.   Mild 20% stenoses in the proximal and mid LAD and 30% in the left circumflex vessel.   LVEDP 13 mmHg.   RECOMMENDATION: Due to the significant difficulty in the radial approach to selectively engage the RCA, recommend PCI via the femoral approach.  Patient had been on clopidogrel therapy prior to the procedure.  Will need aggressive lipid-lowering therapy with target LDL less than 55.  Due to the high-grade ostial RCA stenosis will initiate heparinization 8 hours following TR band removal.    Patient Profile     72 y.o. female with PMH of CAD, HTN, HLD, DM II, chronic back pain and morbid obesity who was recently diagnosed with severe RCA disease on coronary CT>   Assessment & Plan    CAD  - mild CAD on previous cath in 2016 - recent coronary CT showed 99% percentile for coronary calcium, 70-99% stenosis in ost RCA, 50-69% stenosis in mid LCx and OM1 - cath 05/16/2022 showed 20% prox LAD, 20% mid LAD, 30% OM1, 95% ost to prox RCA, 20% prox LAD. Difficult to access through radial artery, plan on staged PCI of RCA via femoral approach today - Risk and benefit of procedure explained to the patient who display clear understanding and agree to proceed. Discussed with patient possible procedural risk include bleeding, vascular injury, renal injury, arrythmia, MI, stroke and loss of limb or life.  - ASA and plavix  HTN: continue labetalol and amlodipine  HLD: on crestor  DM II  Aortic insufficiency: recent echo in Apr 2024 showed EF 60-65%, trivial AI  For questions or updates, please contact [facility] Please consult [email or web address] for contact info under        Signed, Gerry Heaphy, PA  05/17/2022, 9:19 AM    

## 2022-05-17 NOTE — Telephone Encounter (Signed)
Pharmacy Patient Advocate Encounter  Insurance verification completed.    The patient is insured through [employer] Medicare Part D   The patient is currently admitted and ran test claims for the following: Farxiga, Jardiance.  Copays and coinsurance results were relayed to Inpatient clinical team.  

## 2022-05-17 NOTE — Progress Notes (Signed)
Rounding Note    Patient Name: Virginia Flores Date of Encounter: 05/17/2022  [facility] Cardiologist: Olga Millers, MD   Subjective   Feeling ok, no significant discomfort  Inpatient Medications    Scheduled Meds:  amLODipine  10 mg Oral q AM   aspirin  81 mg Oral Daily   clopidogrel  75 mg Oral Q breakfast   insulin aspart  0-15 Units Subcutaneous TID WC   labetalol  400 mg Oral BID   rosuvastatin  40 mg Oral QPM   sodium chloride flush  3 mL Intravenous Q12H   sodium chloride flush  3 mL Intravenous Q12H   sodium chloride flush  3 mL Intravenous Q12H   Continuous Infusions:  sodium chloride 100 mL/hr at 05/17/22 0154   sodium chloride     sodium chloride     sodium chloride 1 mL/kg/hr (05/17/22 0637)   heparin 1,500 Units/hr (05/17/22 0538)   PRN Meds: sodium chloride, sodium chloride, acetaminophen, alum & mag hydroxide-simeth, ondansetron (ZOFRAN) IV, sodium chloride flush, sodium chloride flush   Vital Signs    Vitals:   05/16/22 2106 05/16/22 2346 05/17/22 0519 05/17/22 0751  BP: (!) 149/73 125/64 127/81 (!) 142/79  Pulse: 72 73 66 78  Resp:  18    Temp:  98.8 F (37.1 C)  98.6 F (37 C)  TempSrc:  Oral  Oral  SpO2:  92% 93% 97%  Weight:      Height:        Intake/Output Summary (Last 24 hours) at 05/17/2022 0919 Last data filed at 05/17/2022 0154 Gross per 24 hour  Intake 1636.86 ml  Output 600 ml  Net 1036.86 ml      05/16/2022    7:14 AM 05/11/2022    2:16 PM 04/25/2022    2:19 PM  Last 3 Weights  Weight (lbs) 290 lb 294 lb 290 lb  Weight (kg) 131.543 kg 133.358 kg 131.543 kg      Telemetry    NSR without significant ventricular ectopy - Personally Reviewed  ECG    NSR without significant ST-T wave changes - Personally Reviewed  Physical Exam   GEN: No acute distress.   Neck: No JVD Cardiac: RRR, no murmurs, rubs, or gallops.  Respiratory: Clear to auscultation bilaterally. GI: Soft, nontender, non-distended  MS:  No edema; No deformity. Neuro:  Nonfocal  Psych: Normal affect   Labs    High Sensitivity Troponin:  No results for input(s): "TROPONINIHS" in the last 720 hours.   Chemistry Recent Labs  Lab 05/11/22 1532 05/17/22 0340  NA 144 139  K 4.7 3.6  CL 105 109  CO2 25 25  GLUCOSE 74 115*  BUN 18 10  CREATININE 1.09* 0.85  CALCIUM 9.4 8.1*  GFRNONAA  --  >60  ANIONGAP  --  5    Lipids No results for input(s): "CHOL", "TRIG", "HDL", "LABVLDL", "LDLCALC", "CHOLHDL" in the last 168 hours.  Hematology Recent Labs  Lab 05/11/22 1532 05/17/22 0340  WBC 6.8 5.8  RBC 5.76* 5.36*  HGB 12.8 11.7*  HCT 41.8 38.3  MCV 73* 71.5*  MCH 22.2* 21.8*  MCHC 30.6* 30.5  RDW 18.8* 18.8*  PLT 250 229   Thyroid No results for input(s): "TSH", "FREET4" in the last 168 hours.  BNPNo results for input(s): "BNP", "PROBNP" in the last 168 hours.  DDimer No results for input(s): "DDIMER" in the last 168 hours.   Radiology    CARDIAC CATHETERIZATION  Result Date:  05/16/2022   Prox LAD to Mid LAD lesion is 20% stenosed.   Mid LAD lesion is 20% stenosed.   1st Mrg lesion is 30% stenosed.   Ost RCA to Prox RCA lesion is 95% stenosed.   Ost LAD to Prox LAD lesion is 20% stenosed. 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 Eccentric 95% ostial stenosis of the right coronary artery. Mild 20% stenoses in the proximal and mid LAD and 30% in the left circumflex vessel. LVEDP 13 mmHg. RECOMMENDATION: Due to the significant difficulty in the radial approach to selectively engage the RCA, recommend PCI via the femoral approach.  Patient had been on clopidogrel therapy prior to the procedure.  Will need aggressive lipid-lowering therapy with target LDL less than 55.  Due to the high-grade ostial RCA stenosis will initiate heparinization 8 hours following TR band removal.    Cardiac Studies   Cath 05/16/2022   Prox LAD to Mid LAD lesion is 20% stenosed.   Mid LAD lesion is 20% stenosed.    1st Mrg lesion is 30% stenosed.   Ost RCA to Prox RCA lesion is 95% stenosed.   Ost LAD to Prox LAD lesion is 20% stenosed.   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   Eccentric 95% ostial stenosis of the right coronary artery.   Mild 20% stenoses in the proximal and mid LAD and 30% in the left circumflex vessel.   LVEDP 13 mmHg.   RECOMMENDATION: Due to the significant difficulty in the radial approach to selectively engage the RCA, recommend PCI via the femoral approach.  Patient had been on clopidogrel therapy prior to the procedure.  Will need aggressive lipid-lowering therapy with target LDL less than 55.  Due to the high-grade ostial RCA stenosis will initiate heparinization 8 hours following TR band removal.    Patient Profile     72 y.o. female with PMH of CAD, HTN, HLD, DM II, chronic back pain and morbid obesity who was recently diagnosed with severe RCA disease on coronary CT>   Assessment & Plan    CAD  - mild CAD on previous cath in 2016 - recent coronary CT showed 99% percentile for coronary calcium, 70-99% stenosis in ost RCA, 50-69% stenosis in mid LCx and OM1 - cath 05/16/2022 showed 20% prox LAD, 20% mid LAD, 30% OM1, 95% ost to prox RCA, 20% prox LAD. Difficult to access through radial artery, plan on staged PCI of RCA via femoral approach today - Risk and benefit of procedure explained to the patient who display clear understanding and agree to proceed. Discussed with patient possible procedural risk include bleeding, vascular injury, renal injury, arrythmia, MI, stroke and loss of limb or life.  - ASA and plavix  HTN: continue labetalol and amlodipine  HLD: on crestor  DM II  Aortic insufficiency: recent echo in Apr 2024 showed EF 60-65%, trivial AI  For questions or updates, please contact [facility] Please consult [email or web address] for contact info under        Signed, Azalee Course, PA  05/17/2022, 9:19 AM

## 2022-05-17 NOTE — Progress Notes (Signed)
ANTICOAGULATION CONSULT NOTE - Follow Up Consult  Pharmacy Consult for heparin Indication:  CAD awaiting PCI  Labs: Recent Labs    05/17/22 0340  HGB 11.7*  HCT 38.3  PLT 229  HEPARINUNFRC 0.14*  CREATININE 0.85    Assessment: 72yo female subtherapeutic on heparin with initial dosing post-cath; no infusion issues or signs of bleeding per RN.  Goal of Therapy:  Heparin level 0.3-0.7 units/ml   Plan:  Increase heparin infusion by 3 units/kgABW/hr to 1500 units/hr. Check level in 6 hours.   Vernard Gambles, PharmD, BCPS 05/17/2022 5:12 AM

## 2022-05-17 NOTE — Progress Notes (Signed)
ACT resulted as 158.

## 2022-05-17 NOTE — Interval H&P Note (Signed)
History and Physical Interval Note:  05/17/2022 4:11 PM  Virginia Flores  has presented today for surgery, with the diagnosis of cad.  The various methods of treatment have been discussed with the patient and family. After consideration of risks, benefits and other options for treatment, the patient has consented to  Procedure(s): CORONARY STENT INTERVENTION (N/A) as a surgical intervention.  The patient's history has been reviewed, patient examined, no change in status, stable for surgery.  I have reviewed the patient's chart and labs.  Questions were answered to the patient's satisfaction.     Bryan Lemma

## 2022-05-17 NOTE — TOC Benefit Eligibility Note (Addendum)
Patient Product/process development scientist completed.    The patient is currently admitted and upon discharge could be taking Farxiga 10 mg.  The current 30 day co-pay is $45.00.   The patient is currently admitted and upon discharge could be taking Jardiance 10 mg.  The current 30 day co-pay is $45.00.   The patient is insured through [employer] Part D   This test claim was processed through [name]- copay amounts may vary at other pharmacies due to pharmacy/plan contracts, or as the patient moves through the different stages of their insurance plan.  Virginia Flores, CPHT Pharmacy Patient Advocate Specialist [facility] Patient Advocate Team Direct Number: [phone]  Fax: [phone]

## 2022-05-17 NOTE — Progress Notes (Signed)
ACT resulted as 196. Removed heparin bag from patient's right femoral sheath pressure bag and replaced with normal saline at 2100.

## 2022-05-18 ENCOUNTER — Encounter (HOSPITAL_COMMUNITY): Payer: Self-pay | Admitting: Cardiology

## 2022-05-18 DIAGNOSIS — I119 Hypertensive heart disease without heart failure: Secondary | ICD-10-CM | POA: Diagnosis not present

## 2022-05-18 DIAGNOSIS — Z955 Presence of coronary angioplasty implant and graft: Secondary | ICD-10-CM

## 2022-05-18 DIAGNOSIS — E785 Hyperlipidemia, unspecified: Secondary | ICD-10-CM | POA: Diagnosis not present

## 2022-05-18 DIAGNOSIS — I2584 Coronary atherosclerosis due to calcified coronary lesion: Secondary | ICD-10-CM | POA: Diagnosis not present

## 2022-05-18 DIAGNOSIS — I25118 Atherosclerotic heart disease of native coronary artery with other forms of angina pectoris: Secondary | ICD-10-CM | POA: Diagnosis not present

## 2022-05-18 LAB — BASIC METABOLIC PANEL
Anion gap: 7 (ref 5–15)
BUN: 6 mg/dL — ABNORMAL LOW (ref 8–23)
CO2: 26 mmol/L (ref 22–32)
Calcium: 8.5 mg/dL — ABNORMAL LOW (ref 8.9–10.3)
Chloride: 104 mmol/L (ref 98–111)
Creatinine, Ser: 0.9 mg/dL (ref 0.44–1.00)
GFR, Estimated: >60 mL/min (ref >60)
Glucose, Bld: 170 mg/dL — ABNORMAL HIGH (ref 70–99)
Potassium: 3.7 mmol/L (ref 3.5–5.1)
Sodium: 137 mmol/L (ref 135–145)

## 2022-05-18 LAB — CBC
HCT: 38.7 % (ref 36.0–46.0)
Hemoglobin: 11.7 g/dL — ABNORMAL LOW (ref 12.0–15.0)
MCH: 21.6 pg — ABNORMAL LOW (ref 26.0–34.0)
MCHC: 30.2 g/dL (ref 30.0–36.0)
MCV: 71.5 fL — ABNORMAL LOW (ref 80.0–100.0)
Platelets: 212 10*3/uL (ref 150–400)
RBC: 5.41 MIL/uL — ABNORMAL HIGH (ref 3.87–5.11)
RDW: 19.1 % — ABNORMAL HIGH (ref 11.5–15.5)
WBC: 6.2 10*3/uL (ref 4.0–10.5)
nRBC: 0 % (ref 0.0–0.2)

## 2022-05-18 LAB — GLUCOSE, CAPILLARY: Glucose-Capillary: 131 mg/dL — ABNORMAL HIGH (ref 70–99)

## 2022-05-18 LAB — POCT ACTIVATED CLOTTING TIME: Activated Clotting Time: 158 seconds

## 2022-05-18 LAB — LIPOPROTEIN A (LPA): Lipoprotein (a): 164.1 nmol/L — ABNORMAL HIGH (ref <75.0)

## 2022-05-18 MED ORDER — ASPIRIN 81 MG PO TBEC: 81.0000 mg | DELAYED_RELEASE_TABLET | Freq: Every day | ORAL | 3 refills | Status: AC

## 2022-05-18 MED FILL — Atropine Sulfate Soln Prefill Syr 1 MG/10ML (0.1 MG/ML): INTRAMUSCULAR | Qty: 10 | Status: AC

## 2022-05-18 NOTE — Discharge Summary (Signed)
Discharge Summary    Patient ID: Virginia Flores MRN: [id number]; DOB: 27-Feb-1950  Admit date: 05/16/2022 Discharge date: 05/18/2022  PCP:  Melvenia Beam, MD   [facility] Providers Cardiologist:  Olga Millers, MD   {     Discharge Diagnoses    Principal Problem:   CAD (coronary artery disease), native coronary artery    Diagnostic Studies/Procedures    Stage PCI 05/17/22:   Fluoro time: 21.5 (min) DAP: 57.3 (Gycm2) Cumulative Air Kerma: 1293.9 (mGy)  Diagnostic Dominance: Right  Intervention      Ost RCA to Prox RCA lesion is 95% stenosed.  Heavily calcified eccentric lesion via IVUS   Prox RCA lesion is 25% stenosed.   A drug-eluting stent was successfully placed using a STENT MEGATRON 4.0X20.   Post intervention, there is a 0% residual stenosis.     POST-CATH DIAGNOSES Successful IVUS-guided, CSI Orbital Atherectomy based the yes PCI ostial RCA with MEGATRON 4.0 mm x 20 mm postdilated to 4.6 mm. -> Lesion reduced from 95% to 0%, TIMI-3 flow pre and post.      RECOMMENDATION Return to nursing floor for ongoing care TR band removal once ACT is below therapeutic range.   Anticipate discharge in the morning.   Left heart cath on 05/16/22:   Fluoro time: 18.8 (min) DAP: 115 (Gycm2) Cumulative Air Kerma: 1653.4 (mGy)    Prox LAD to Mid LAD lesion is 20% stenosed.   Mid LAD lesion is 20% stenosed.   1st Mrg lesion is 30% stenosed.   Ost RCA to Prox RCA lesion is 95% stenosed.   Ost LAD to Prox LAD lesion is 20% stenosed.   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   Eccentric 95% ostial stenosis of the right coronary artery.   Mild 20% stenoses in the proximal and mid LAD and 30% in the left circumflex vessel.   LVEDP 13 mmHg.   RECOMMENDATION: Due to the significant difficulty in the radial approach to selectively engage the RCA, recommend PCI via the femoral approach.  Patient had been on clopidogrel  therapy prior to the procedure.  Will need aggressive lipid-lowering therapy with target LDL less than 55.  Due to the high-grade ostial RCA stenosis will initiate heparinization 8 hours following TR band removal.     Echo from 04/13/22:  1. Left ventricular ejection fraction, by estimation, is 60 to 65%. The  left ventricle has normal function. The left ventricle has no regional  wall motion abnormalities. There is moderate left ventricular hypertrophy.  Left ventricular diastolic  parameters are consistent with Grade II diastolic dysfunction  (pseudonormalization). GLS -11/8%   2. Right ventricular systolic function is normal. The right ventricular  size is normal. There is mildly elevated pulmonary artery systolic  pressure.   3. Left atrial size was mild to moderately dilated.   4. The mitral valve is normal in structure. No evidence of mitral valve  regurgitation. No evidence of mitral stenosis.   5. The aortic valve is normal in structure. Aortic valve regurgitation is  trivial. Aortic valve sclerosis is present, with no evidence of aortic  valve stenosis.   6. The inferior vena cava is normal in size with greater than 50%  respiratory variability, suggesting right atrial pressure of 3 mmHg.     History of Present Illness     Per office note/H&P on 05/11/22:  Virginia Flores is a 72 y.o. female with a history of CAD with severe stenosis of  RCA noted on recent coronary CTA on 05/02/2022, mild aortic stenosis/ mild aortic insufficiency, hypertension, hyperlipidemia, type 2 diabetes mellitus, chronic back pain, and morbid obesity who is followed by Dr. Jens Som and presented to the [facility] 05/11/22 for follow-up of abnormal coronary CTA.   Patient was initially seen by Dr. Jens Som in 10/2014 at which time she reported dyspnea on exertion as well as a burning cessation in her left upper chest and shoulder when she was stressed. Echo showed LVEF of 60-65% with normal wall motion and  grade 1 DD as well as mild to moderate aortic insufficiency. Myoview was intermediate risk and showed some reversible defects concerning for ischemia. Therefore, cardiac catheterization was arranged and showed only mild non-obstructive disease. Monitor was also ordered in 10/2014 for further evaluation of occasional palpitations which showed PACs/ PVCs but no significant arrhythmias. She was not seen again until 03/2019 at which time she reported continued dyspnea on exertion and occasional palpitations as well as a 28lb weight gain over the last year which she attributed to COVID. Echo showed LVEF of 60-65% with normal wall motion and grade 2 DD , normal RV, mild aortic stenosis, and mild aortic regurgitation. Myoview was low risk and showed no evidence of ischemia or prior infarction. Patient was lost to follow-up again after this until recently.    She was referred back to Cardiology and seen by Dr. Jens Som on 04/25/2022 at which time she reported worsening dyspnea on exertion over the last 7-12 months. Recent Echo earlier that month showed LVEF of 60-65% with normal wall motion, moderate LVH, and grade II diastolic dysfunction as well as normal RV. No aortic stenosis was seen and only trivial aortic regurgitation was noted. Dr. Jens Som ordered a coronary CTA which showed a coronary calcium score of 2,831 (99th percentile for age and sex) and severe stenosis (70-99%) in the ostial RCA, moderate stenosis (50-69%) in the mid LCx and OM1, mild stenosis (25-49%) in the mid/ distal LAD and mid/ distal RCA, and minimal stenosis (0-24%) of the left main as well as the proximal LAD and proximal LCx. Patient was started on Plavix 75mg  daily given Aspirin allergy and follow-up visit was arranged to discuss cardiac catheterization.    Patient presented 05/11/22 for office follow up and had reviewed coronary CTA results. Patient reported continued dyspnea with minimal activity but stated this has not gotten any worse since  last visit. She also reported intermittent chest tightness as well as some left arm/ elbow pain which she stated she has had for a while several months. She was not sure if her arm pain is musculoskeletal in nature or if that was an anginal equivalent.  Described left arm/ elbow most of the time that she has had for a while. She denied any new or worsening orthopnea and reported only rare PND. She had chronic lower extremity edema and stated right leg is usually larger than the left but this has been stable. She reported occasional "flutter" but denies any prolonged episodes of palpitations.  She reported one isolated episode of dizziness right after Christmas but none since. No syncope.   She was arranged for outpatient cardiac cath that occurred on 05/17/22.    Hospital Course     Consultants: N/A   Dyspnea on exertion Multivessel CAD  - presented for outpatient cath due to DOE and abnormal CT coronary study  - LHC on 05/17/22 showed 20% prox LAD, 20% mid LAD, 30% OM1, 95% ost to prox RCA, 20% prox LAD.  LVEDP 13 mmHg. Difficulty with radial approach, staged PCI with femoral approach was recommended.  - s/p successful CSI Orbital Atherectomy to ostial RCA with placement of DES on 05/18/22  - Echo from 04/13/22: LVEF 60-65%, mod LVH, grade II DD, normal RV, mild to mod LAE, trivial AI.  - Medical therapy: continue PTA Plavix, ASA 81mg  added post cath (hx of GI intolerance/not true allergy, may take PRN OTC famotidine); continue PTA crestor 40mg ; labetalol 400mg  BID; and PRN Nitro - Post cath discussed in detail at bedside  - Follow up arranged on 05/30/22   HTN - BP is controlled, had elevated trend yesterday, continue PTA amlodipine and labetalol   Aortic Stenosis/ Insufficiency - Prior Echos have shown mild AS and mild to moderate AI. Most recent Echo in 04/2022 showed only trivial AI and no AS. Continue routine Echo monitor.   HLD - add lipid panel today, follow up outpatient - continue PTA  crestor 40mg  daily   Type 2 Diabetes Mellitus - resume PTA glipizide and ozempic, follow up with PCP    Morbid Obesity - recommend weight loss     Did the patient have an acute coronary syndrome (MI, NSTEMI, STEMI, etc) this admission?:  No                               Did the patient have a percutaneous coronary intervention (stent / angioplasty)?:  Yes.     Cath/PCI Registry Performance & Quality Measures: Aspirin prescribed? - Yes ADP Receptor Inhibitor (Plavix/Clopidogrel, Brilinta/Ticagrelor or Effient/Prasugrel) prescribed (includes medically managed patients)? - Yes High Intensity Statin (Lipitor 40-80mg  or Crestor 20-40mg ) prescribed? - Yes For EF <40%, was ACEI/ARB prescribed? - Not Applicable (EF >/= 40%) For EF <40%, Aldosterone Antagonist (Spironolactone or Eplerenone) prescribed? - Not Applicable (EF >/= 40%) Cardiac Rehab Phase II ordered? - Yes       The patient will be scheduled for a TOC follow up appointment in 12 days.  A message has been sent to the [facility] and Scheduling Pool at the office where the patient should be seen for follow up.  _____________  Discharge Vitals Blood pressure 105/65, pulse 70, temperature 98.8 F (37.1 C), temperature source Oral, resp. rate 15, height 5\' 4"  (1.626 m), weight 131.5 kg, SpO2 90 %.  Filed Weights   05/16/22 0714  Weight: 131.5 kg  Vitals:  Vitals:   05/17/22 2136 05/18/22 0252  BP: (!) 150/71 105/65  Pulse: 62 70  Resp:  15  Temp:  98.8 F (37.1 C)  SpO2: 95% 90%   Physical exam:   General Appearance: In no apparent distress, sitting in chair HEENT: Normocephalic, atraumatic.  Neck: Supple, trachea midline, no JVDs Cardiovascular: Regular rate and rhythm, normal S1-S2,  no murmur Respiratory: Resting breathing unlabored, lungs sounds clear to auscultation bilaterally, no use of accessory muscles. On room air.  No wheezes, rales or rhonchi.   Gastrointestinal: Bowel sounds positive, abdomen soft,  obese Extremities: Able to move all extremities in bed without difficulty, no leg edema Musculoskeletal: Normal muscle bulk and tone Skin: Intact, warm, dry. Neurologic: Alert, oriented to person, place and time. Fluent speech, no cognitive deficit Psychiatric: Normal affect. Mood is appropriate.  Right wrist with dressing, pulse 2+, no neurovascular deficit, no hematoma or bleeding  Right groin with dressing, distal pedal pulse 1+, no neurovascular deficit, no hematoma or bleeding       Labs & Radiologic Studies  CBC Recent Labs    05/17/22 0340 05/18/22 0241  WBC 5.8 6.2  HGB 11.7* 11.7*  HCT 38.3 38.7  MCV 71.5* 71.5*  PLT 229 212   Basic Metabolic Panel Recent Labs    16/10/96 0340 05/18/22 0241  NA 139 137  K 3.6 3.7  CL 109 104  CO2 25 26  GLUCOSE 115* 170*  BUN 10 6*  CREATININE 0.85 0.90  CALCIUM 8.1* 8.5*   Liver Function Tests No results for input(s): "AST", "ALT", "ALKPHOS", "BILITOT", "PROT", "ALBUMIN" in the last 72 hours. No results for input(s): "LIPASE", "AMYLASE" in the last 72 hours. High Sensitivity Troponin:   No results for input(s): "TROPONINIHS" in the last 720 hours.  BNP Invalid input(s): "POCBNP" D-Dimer No results for input(s): "DDIMER" in the last 72 hours. Hemoglobin A1C No results for input(s): "HGBA1C" in the last 72 hours. Fasting Lipid Panel No results for input(s): "CHOL", "HDL", "LDLCALC", "TRIG", "CHOLHDL", "LDLDIRECT" in the last 72 hours. Thyroid Function Tests No results for input(s): "TSH", "T4TOTAL", "T3FREE", "THYROIDAB" in the last 72 hours.  Invalid input(s): "FREET3" _____________  CARDIAC CATHETERIZATION  Result Date: 05/17/2022   Ost RCA to Prox RCA lesion is 95% stenosed.  Heavily calcified eccentric lesion via IVUS   Prox RCA lesion is 25% stenosed.   A drug-eluting stent was successfully placed using a STENT MEGATRON 4.0X20.   Post intervention, there is a 0% residual stenosis. POST-CATH DIAGNOSES  Successful IVUS-guided, CSI Orbital Atherectomy based the yes PCI ostial RCA with MEGATRON 4.0 mm x 20 mm postdilated to 4.6 mm. -> Lesion reduced from 95% to 0%, TIMI-3 flow pre and post. RECOMMENDATION Return to nursing floor for ongoing care TR band removal once ACT is below therapeutic range.  Anticipate discharge in the morning. Bryan Lemma, MD  CARDIAC CATHETERIZATION  Result Date: 05/16/2022   Prox LAD to Mid LAD lesion is 20% stenosed.   Mid LAD lesion is 20% stenosed.   1st Mrg lesion is 30% stenosed.   Ost RCA to Prox RCA lesion is 95% stenosed.   Ost LAD to Prox LAD lesion is 20% stenosed. 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 Eccentric 95% ostial stenosis of the right coronary artery. Mild 20% stenoses in the proximal and mid LAD and 30% in the left circumflex vessel. LVEDP 13 mmHg. RECOMMENDATION: Due to the significant difficulty in the radial approach to selectively engage the RCA, recommend PCI via the femoral approach.  Patient had been on clopidogrel therapy prior to the procedure.  Will need aggressive lipid-lowering therapy with target LDL less than 55.  Due to the high-grade ostial RCA stenosis will initiate heparinization 8 hours following TR band removal.   CT CORONARY MORPH W/CTA COR W/SCORE W/CA W/CM &/OR WO/CM  Addendum Date: 05/06/2022   ADDENDUM REPORT: 05/06/2022 12:22 EXAM: OVER-READ INTERPRETATION  CT CHEST The following report is an over-read performed by radiologist Dr. Karle Barr [facility] on 05/06/2022. This over-read does not include interpretation of cardiac or coronary anatomy or pathology. The coronary CTA interpretation by the cardiologist is attached. COMPARISON:  None. FINDINGS: Aorta normal caliber with atherosclerotic calcifications. Visualized pulmonary arteries patent. Esophagus unremarkable. No adenopathy. Prior laparoscopic gastric band surgery. Remaining visualized upper abdomen normal appearance. Minimal atelectasis  LEFT lower lobe. Lungs otherwise clear. No acute osseous findings. Probable vertebral hemangioma lower thoracic spine. IMPRESSION: Prior laparoscopic gastric band surgery. Minimal atelectasis LEFT lower lobe. Aortic Atherosclerosis (ICD10-I70.0). Electronically Signed   By: Angelyn Punt.D.  On: 05/06/2022 12:22   Result Date: 05/06/2022 CLINICAL DATA:  66F with DOE EXAM: Cardiac/Coronary CTA TECHNIQUE: The patient was scanned on a [employer]. FINDINGS: A 100 kV prospective scan was triggered in the descending thoracic aorta at 111 HU's. Axial non-contrast 3 mm slices were carried out through the heart. The data set was analyzed on a dedicated work station and scored using the Agatson method. Gantry rotation speed was 250 msecs and collimation was .6 mm. No beta blockade and 0.8 mg of sl NTG was given. The 3D data set was reconstructed in 5% intervals of the 35-75% of the R-R cycle. Phases were analyzed on a dedicated work station using MPR, MIP and VRT modes. The patient received 100 cc of contrast. Coronary Arteries:  Normal coronary origin.  Right dominance. RCA is a large dominant artery that gives rise to PDA and PLA. Calcified plaque in proximal RCA causes 70-99% stenosis. Calcified plaque in mid RCA causes 24-49%. Calcified plaque in distal RCA causes 24-49%. Left main is a large artery that gives rise to LAD and LCX arteries. Calcified plaque in left main causes 0-24% stenosis LAD is a large vessel that has no plaque. Calcified plaque in proximal LAD causes 0-24% stenosis. Calcified plaque in mid LAD causes 24-49% stenosis. Calcified plaque in distal LAD causes 24-49% stenosis. LCX is a non-dominant artery that gives rise to one large OM1 branch. Calcified plaque in proximal LCX causes 0-24% stenosis. Calcified plaque in mid LCX causes 50-69% stenosis. Calcified plaque in OM1 causes 50-69% stenosis Other findings: Left Ventricle: Normal size Left Atrium: Moderate enlargement Pulmonary Veins:  Normal configuration Right Ventricle: Mild enlargement Right Atrium: Mild enlargement Cardiac valves: Moderate AV calcifications and mitral annual calcification Thoracic aorta: Dilated ascending aorta measuring 38mm Pulmonary Arteries: Dilated main PA measuring 36mm Systemic Veins: Normal drainage Pericardium: Normal thickness IMPRESSION: 1. Coronary calcium score of 2831. This was 99th percentile for age and sex matched control. 2. Total plaque volume 2359mm3 which is 100th percentile for age and sex-matched controls (calcified plaque 443mm3; noncalcified plaque 1930mm3). TPV is extensive 3.  Normal coronary origin with right dominance. 4. Poor quality study, interpretation is affected by blooming artifact from severe coronary calcifications as well as high noise due to obesity 5.  Severe (70-99%) stenosis in ostial RCA 6.  Moderate (50-69%) stenosis in mid LCX and OM1 7.  Mild (25-49%) stenosis in mid/distal LAD and mid/distal RCA 8. Minimal (0-24%) stenosis in left main, proximal LAD, and proximal LCX 9.  Moderate aortic valve calcifications 10. Dilated main pulmonary artery measuring 36mm 11. Dilated ascending aorta measuring 38mm 12. Will send study for CTFFR CAD-RADS 4 Severe stenosis. (70-99% or > 50% left main). Cardiac catheterization or CT FFR is recommended. Consider symptom-guided anti-ischemic pharmacotherapy as well as risk factor modification per guideline directed care. Electronically Signed: By: Epifanio Lesches M.D. On: 05/02/2022 20:35   CT CORONARY FFR DATA PREP & FLUID ANALYSIS  Result Date: 05/02/2022 EXAM: FFRCT ANALYSIS FINDINGS: FFRct analysis was performed on the original cardiac CT angiogram dataset. Diagrammatic representation of the FFRct analysis is provided in a separate PDF document in PACS. This dictation was created using the PDF document and an interactive 3D model of the results. 3D model is not available in the EMR/PACS. Normal FFR range is >0.80. 1. Left Main: No  significant stenosis 2. LAD: No significant stenosis 3. LCX: CTFFR 0.95 across lesion in mid LCX, suggesting no functional significance. CTFFR 0.91 across lesion in proximal OM1 and  falls to 0.80 across lesion in distal OM1, suggesting neither lesion is functionally significant 4. 5. RCA: CTFFR 0.87 across lesion in ostial RCA and falls to 0.82 across lesion in distal RCA, suggesting neither lesion is functionally significant. IMPRESSION: 1. CTFFR suggests nonobstructive CAD. However would note that CTFFR may be less reliable in setting of poor image quality with severe coronary calcifications Electronically Signed   By: Epifanio Lesches M.D.   On: 05/02/2022 20:45   Disposition   Patient is feeling improved and OOB this morning, states had some chest soreness but no DOE. Post cath care discussed in detail. Medication therapy discussed in detail. Patient is seen by Dr Cristal Deer and deemed stable for discharge home. Follow up plan discussed and arranged. Pt is being discharged home today in good condition.   Follow-up Plans & Appointments     Follow-up Information     Ronney Asters, NP Follow up on 05/30/2022.   Specialty: Cardiology Why: at 2:20 PM for your cardiology post hospital follow up appointment Contact information: [address] [place] [address] [phone]                Discharge Instructions     AMB Referral to Cardiac Rehabilitation - Phase II   Complete by: As directed    Diagnosis:  Coronary Stents Stable Angina     After initial evaluation and assessments completed: Virtual Based Care may be provided alone or in conjunction with Phase 2 Cardiac Rehab based on patient barriers.: Yes   Intensive Cardiac Rehabilitation (ICR) [facility] location only OR Traditional Cardiac Rehabilitation (TCR) *If criteria for ICR are not met will enroll in TCR [facility] only): Yes   Diet - low sodium heart healthy   Complete by: As directed    Discharge instructions    Complete by: As directed    PLEASE REMEMBER TO BRING ALL OF YOUR MEDICATIONS TO EACH OF YOUR FOLLOW-UP OFFICE VISITS.  PLEASE ATTEND ALL SCHEDULED FOLLOW-UP APPOINTMENTS.   Activity: Increase activity slowly as tolerated. You may shower, but no soaking baths (or swimming) for 1 week. No driving for 24 hours. No lifting over 5 lbs for 1 week. No sexual activity for 1 week.   You May Return to Work: in 2 weeks if applicable   Wound Care: You may wash cath site gently with soap and water. Keep cath site clean and dry. If you notice pain, swelling, bleeding or pus at your cath site, please call [phone].    Radial Site Care  Refer to this sheet in the next few weeks. These instructions provide you with information on caring for yourself after your procedure. Your caregiver may also give you more specific instructions. Your treatment has been planned according to current medical practices, but problems sometimes occur. Call your caregiver if you have any problems or questions after your procedure.  HOME CARE INSTRUCTIONS You may shower the day after the procedure. Remove the bandage (dressing) and gently wash the site with plain soap and water. Gently pat the site dry.  Do not apply powder or lotion to the site.  Do not submerge the affected site in water for 3 to 5 days.  Inspect the site at least twice daily.  Do not flex or bend the affected arm for 24 hours.  No lifting over 5 pounds (2.3 kg) for 5 days after your procedure.  Do not drive home if you are discharged the same day of the procedure. Have someone else drive  you.  You may drive 24 hours after the procedure unless otherwise instructed by your caregiver.   What to expect: Any bruising will usually fade within 1 to 2 weeks.  Blood that collects in the tissue (hematoma) may be painful to the touch. It should usually decrease in size and tenderness within 1 to 2 weeks.   SEEK IMMEDIATE MEDICAL CARE IF: You have unusual pain  at the radial site.  You have redness, warmth, swelling, or pain at the radial site.  You have drainage (other than a small amount of blood on the dressing).  You have chills.  You have a fever or persistent symptoms for more than 72 hours.  You have a fever and your symptoms suddenly get worse.  Your arm becomes pale, cool, tingly, or numb.  You have heavy bleeding from the site. Hold pressure on the site.      Groin Site Care  Refer to this sheet in the next few weeks. These instructions provide you with information on caring for yourself after your procedure. Your caregiver may also give you more specific instructions. Your treatment has been planned according to current medical practices, but problems sometimes occur. Call your caregiver if you have any problems or questions after your procedure.  HOME CARE INSTRUCTIONS You may shower 24 hours after the procedure. Remove the bandage (dressing) and gently wash the site with plain soap and water. Gently pat the site dry.  Do not apply powder or lotion to the site.  Do not sit in a bathtub, swimming pool, or whirlpool for 5 to 7 days.  No bending, squatting, or lifting anything over 10 pounds (4.5 kg) as directed by your caregiver.  Inspect the site at least twice daily.  Do not drive home if you are discharged the same day of the procedure. Have someone else drive you.  You may drive 24 hours after the procedure unless otherwise instructed by your caregiver.   What to expect: Any bruising will usually fade within 1 to 2 weeks.  Blood that collects in the tissue (hematoma) may be painful to the touch. It should usually decrease in size and tenderness within 1 to 2 weeks.   SEEK IMMEDIATE MEDICAL CARE IF: You have unusual pain at the groin site or down the affected leg.  You have redness, warmth, swelling, or pain at the groin site.  You have drainage (other than a small amount of blood on the dressing).  You have chills.  You have a  fever or persistent symptoms for more than 72 hours.  You have a fever and your symptoms suddenly get worse.  Your leg becomes pale, cool, tingly, or numb.  You have heavy bleeding from the site. Hold pressure on the site. [name]   PLEASE DO NOT MISS ANY DOSES OF YOUR ASPIRIN/PLAVIX !!!!!   Also keep a log of you blood pressures and bring back to your follow up appt. Please call the office with any questions.   Patients taking blood thinners should generally stay away from medicines like ibuprofen, Advil, Motrin, naproxen, and Aleve due to risk of stomach bleeding. You may take Tylenol as directed or talk to your primary doctor about alternatives.  PLEASE ENSURE THAT YOU DO NOT RUN OUT OF YOUR ASPIRIN/PLAVIX. This medication is very important to remain on for at least 6 months. IF you have issues obtaining this medication due to cost please CALL the office 3-5 business days prior to running out in order to prevent  missing doses of this medication.   Increase activity slowly   Complete by: As directed         Discharge Medications   Allergies as of 05/18/2022       Reactions   Aspirin    GI upset, tolerates low dose aspirin         Medication List     STOP taking these medications    acetaminophen 325 MG tablet Commonly known as: TYLENOL   metoprolol tartrate 50 MG tablet Commonly known as: Lopressor       TAKE these medications    amLODipine 10 MG tablet Commonly known as: NORVASC Take 10 mg by mouth in the morning.   aspirin EC 81 MG tablet Take 1 tablet (81 mg total) by mouth daily. Swallow whole.   clopidogrel 75 MG tablet Commonly known as: PLAVIX Take 1 tablet (75 mg total) by mouth daily.   furosemide 20 MG tablet Commonly known as: LASIX Take 20 mg by mouth daily as needed (fluid retention.).   glipiZIDE 10 MG tablet Commonly known as: GLUCOTROL Take 10 mg by mouth in the morning.   glucose blood test strip Commonly known as: ONE TOUCH ULTRA  TEST Use as instructed to check blood sugar once a day.  DX E11.65   labetalol 200 MG tablet Commonly known as: NORMODYNE Take 400 mg by mouth 2 (two) times daily.   methocarbamol 500 MG tablet Commonly known as: ROBAXIN Take 1 tablet (500 mg total) by mouth every 6 (six) hours as needed for muscle spasms.   nitroGLYCERIN 0.4 MG SL tablet Commonly known as: NITROSTAT Place 1 tablet (0.4 mg total) under the tongue every 5 (five) minutes as needed for chest pain.   onetouch ultrasoft lancets Use as instructed to check blood sugar once a day.  DX E11.6   OZEMPIC (0.25 OR 0.5 MG/DOSE) [place] Inject 0.25 mg into the skin every Wednesday.   ProAir HFA 108 (90 Base) MCG/ACT inhaler Generic drug: albuterol Inhale 2 puffs into the lungs every 6 (six) hours as needed for shortness of breath.   rosuvastatin 40 MG tablet Commonly known as: CRESTOR Take 1 tablet (40 mg total) by mouth daily. What changed: when to take this           Outstanding Labs/Studies   N/A   Duration of Discharge Encounter   Greater than 30 minutes including physician time.  Signed, Cyndi Bender, NP 05/18/2022, 8:10 AM

## 2022-05-18 NOTE — Progress Notes (Signed)
CARDIAC REHAB PHASE I   PRE:  Rate/Rhythm: 91 NSR  BP:  Sitting: 106/63      SaO2: 95 RA  MODE:  Ambulation: 30 ft   AD:   Cane  POST:  Rate/Rhythm: 100 ST  BP:  Sitting: 129/70      SaO2: 95 RA  Pt amb with standby assistance, pt denies CP and reports min SOB during amb and was returned to room w/o complaint.   Pt was educated on stent card, stent location, Antiplatelet and ASA use, wt restrictions, no baths/daily wash-ups, s/s of infection, ex guidelines, s/s to stop exercising, NTG use and calling 911, heart healthy diet, risk factors and CRPII. Pt received materials on exercise, diet, and CRPII. Will refer to [facility].   Referral placed to: [facility] under cardiologist Dr. Jens Som Qualifying Dx: 5/15 PCI   Virginia Flores  ACSM-CEP 9:15 AM 05/18/2022    Service time is from 0840 to 0918.

## 2022-05-18 NOTE — Progress Notes (Signed)
Pulled right femoral sheath with Alesia Richards, RN. Pressure held for 24 minutes, until 0029. Site clean, dry, and level 0 with transparent dressing applied. Distal pulses palpable. Patient educated on bed rest and when to call RN.

## 2022-05-18 NOTE — Progress Notes (Addendum)
Discharge instructions reviewed with pt, her husband and daughter.  Education on radial and groin site care reviewed and handout provided.  Copy of instructions given to pt. Pt informed script was sent to her pharmacy for pick up. Pt to b ed/c'd via wheelchair with belongings, with family.             To be escorted by staff/hospital volunteer.   Annice Needy, RN SWOT

## 2022-05-19 ENCOUNTER — Telehealth (HOSPITAL_COMMUNITY): Payer: Self-pay

## 2022-05-19 NOTE — Telephone Encounter (Signed)
Per Phase 1 Cardiac Rehab fax referral to [facility]

## 2022-05-25 ENCOUNTER — Telehealth: Payer: Self-pay | Admitting: Cardiology

## 2022-05-25 NOTE — Telephone Encounter (Signed)
Paper Work Dropped Off: FMLA  Date: 05/25/2022  Location of paper:  Provider Mailbox
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
## 2022-05-26 NOTE — Telephone Encounter (Signed)
FMLA paperwork complete and placed for the admin department.

## 2022-05-30 ENCOUNTER — Ambulatory Visit: Payer: Medicare (Managed Care) | Attending: General Practice | Admitting: General Practice

## 2022-05-30 ENCOUNTER — Encounter: Payer: Self-pay | Admitting: General Practice

## 2022-05-30 VITALS — BP 118/78 | HR 67 | Ht 64.0 in | Wt 272.6 lb

## 2022-05-30 DIAGNOSIS — E785 Hyperlipidemia, unspecified: Secondary | ICD-10-CM | POA: Diagnosis not present

## 2022-05-30 DIAGNOSIS — E118 Type 2 diabetes mellitus with unspecified complications: Secondary | ICD-10-CM

## 2022-05-30 DIAGNOSIS — I10 Essential (primary) hypertension: Secondary | ICD-10-CM

## 2022-05-30 DIAGNOSIS — I25118 Atherosclerotic heart disease of native coronary artery with other forms of angina pectoris: Secondary | ICD-10-CM | POA: Diagnosis not present

## 2022-05-30 DIAGNOSIS — R002 Palpitations: Secondary | ICD-10-CM

## 2022-05-30 DIAGNOSIS — Z7984 Long term (current) use of oral hypoglycemic drugs: Secondary | ICD-10-CM

## 2022-05-30 NOTE — Patient Instructions (Signed)
Medication Instructions:  The current medical regimen is effective;  continue present plan and medications as directed. Please refer to the Current Medication list given to you today.  *If you need a refill on your cardiac medications before your next appointment, please call your pharmacy*  Lab Work: NONE If you have labs (blood work) drawn today and your tests are completely normal, you will receive your results only by:  MyChart Message (if you have MyChart) OR  A paper copy in the mail If you have any lab test that is abnormal or we need to change your treatment, we will call you to review the results.  Testing/Procedures: OK FOR CARDIAC REHAB  Follow-Up: At [facility], you and your health needs are our priority.  As part of our continuing mission to provide you with exceptional heart care, we have created designated Provider Care Teams.  These Care Teams include your primary Cardiologist (physician) and Advanced Practice Providers (APPs -  Physician Assistants and Nurse Practitioners) who all work together to provide you with the care you need, when you need it.  Your next appointment:   3-4 month(s)  Provider:   Olga Millers, MD     Other Instructions MAY USE TYLENOL, DICLOFENAC, LIDODERM PATCHES FOR YOUR BACK PAIN

## 2022-06-23 ENCOUNTER — Ambulatory Visit: Payer: Medicare (Managed Care) | Attending: Cardiovascular Disease | Admitting: Student

## 2022-06-23 DIAGNOSIS — E785 Hyperlipidemia, unspecified: Secondary | ICD-10-CM | POA: Diagnosis not present

## 2022-06-23 NOTE — Patient Instructions (Addendum)
Will get updated lipid lab today.   Continue taking rosuvastatin 40 mg daily.  We will start the process to get PCSK9i (Repatha or Praluent) covered by your insurance.  Once the prior authorization is complete, we will call you to let you know and confirm pharmacy information.      Praluent is a cholesterol medication that improved your body's ability to get rid of "bad cholesterol" known as LDL. It can lower your LDL up to 60%. It is an injection that is given under the skin every 2 weeks. The most common side effects of Praluent include runny nose, symptoms of the common cold, rarely flu or flu-like symptoms, back/muscle pain in about 3-4% of the patients, and redness, pain, or bruising at the injection site.    Repatha is a cholesterol medication that improved your body's ability to get rid of "bad cholesterol" known as LDL. It can lower your LDL up to 60%! It is an injection that is given under the skin every 2 weeks. The most common side effects of Repatha include runny nose, symptoms of the common cold, rarely flu or flu-like symptoms, back/muscle pain in about 3-4% of the patients, and redness, pain, or bruising at the injection site.   Lab orders: We want to repeat labs after 2-3 months.  We will send you a lab order to remind you once we get closer to that time.
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
## 2022-06-24 LAB — LIPID PANEL
Chol/HDL Ratio: 4.3 ratio (ref 0.0–4.4)
Cholesterol, Total: 181 mg/dL (ref 100–199)
HDL: 42 mg/dL (ref >39)
LDL Chol Calc (NIH): 120 mg/dL — ABNORMAL HIGH (ref 0–99)
Triglycerides: 104 mg/dL (ref 0–149)
VLDL Cholesterol Cal: 19 mg/dL (ref 5–40)

## 2022-06-26 ENCOUNTER — Encounter: Payer: Self-pay | Admitting: Student

## 2022-06-26 ENCOUNTER — Telehealth: Payer: Self-pay | Admitting: Pharmacist

## 2022-06-26 DIAGNOSIS — E785 Hyperlipidemia, unspecified: Secondary | ICD-10-CM

## 2022-06-26 NOTE — Assessment & Plan Note (Signed)
Assessment:  LDL goal: < 55 mg/dl last LDLc 409 mg/dl (81/19/1478) Tolerates high intensity statin well  Discussed next potential options (ezetimibe, PCSK-9 inhibitors, bempedoic acid and inclisiran); cost, dosing efficacy, side effects    Plan: Continue taking current medications (Crestor 40 mg daily)  Start taking Zetia 10 mg daily follow up lipid and LFT due in 8 weeks  Zetia lowers LDLc only 18-20% unlikely to drop LDLc near goal ,Will apply for PA for PCSK9i; will inform patient upon approval  May consider d/c Zetia if PCSK9i get approved by insurance

## 2022-06-28 ENCOUNTER — Other Ambulatory Visit (HOSPITAL_COMMUNITY): Payer: Self-pay

## 2022-06-28 ENCOUNTER — Telehealth: Payer: Self-pay

## 2022-06-28 NOTE — Telephone Encounter (Signed)
Pharmacy Patient Advocate Encounter  Prior Authorization for REPATHA has been APPROVED by [employer]  from 5.27.24 to 6.26.25.

## 2022-06-29 MED ORDER — EZETIMIBE 10 MG PO TABS: 10.0000 mg | ORAL_TABLET | Freq: Every day | ORAL | 3 refills | Status: DC

## 2022-06-29 MED ORDER — REPATHA SURECLICK 140 MG/ML ~~LOC~~ SOAJ: 140.0000 mg | SUBCUTANEOUS | 2 refills | Status: DC

## 2022-06-29 MED ORDER — REPATHA SURECLICK 140 MG/ML ~~LOC~~ SOAJ: 140.0000 mg | SUBCUTANEOUS | 3 refills | Status: DC

## 2022-06-29 NOTE — Telephone Encounter (Signed)
Inform patient about PA approval. Will be starting Repatha and Ezetimibe. FLP and LFT is due end of Sept.
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
## 2022-09-20 ENCOUNTER — Encounter: Payer: Self-pay | Admitting: Cardiology

## 2022-09-20 ENCOUNTER — Other Ambulatory Visit (HOSPITAL_COMMUNITY): Payer: Self-pay

## 2022-09-20 ENCOUNTER — Telehealth: Payer: Self-pay | Admitting: Pharmacy Technician

## 2022-09-20 ENCOUNTER — Ambulatory Visit: Payer: Medicare (Managed Care) | Attending: Cardiology | Admitting: Cardiology

## 2022-09-20 ENCOUNTER — Telehealth: Payer: Self-pay | Admitting: *Deleted

## 2022-09-20 VITALS — BP 124/80 | HR 73 | Ht 64.0 in | Wt 277.8 lb

## 2022-09-20 DIAGNOSIS — I350 Nonrheumatic aortic (valve) stenosis: Secondary | ICD-10-CM

## 2022-09-20 DIAGNOSIS — E785 Hyperlipidemia, unspecified: Secondary | ICD-10-CM | POA: Diagnosis not present

## 2022-09-20 DIAGNOSIS — I10 Essential (primary) hypertension: Secondary | ICD-10-CM

## 2022-09-20 DIAGNOSIS — R0609 Other forms of dyspnea: Secondary | ICD-10-CM

## 2022-09-20 DIAGNOSIS — I25118 Atherosclerotic heart disease of native coronary artery with other forms of angina pectoris: Secondary | ICD-10-CM | POA: Diagnosis not present

## 2022-09-20 NOTE — Telephone Encounter (Signed)
Spoke to patient, patient is in coverage gap. Will provide 2 samples and when grant reopen early fall  will enroll her in [facility] grant.

## 2022-09-20 NOTE — Telephone Encounter (Signed)
Pharmacy Patient Advocate Encounter  Insurance verification completed.    The patient is insured through [employer] test claim for repatha. Currently a quantity of 6 ML is a 84 day supply and the co-pay is $347.17 .   This test claim was processed through [facility]- copay amounts may vary at other pharmacies due to pharmacy/plan contracts, or as the patient moves through the different stages of their insurance plan.

## 2022-09-20 NOTE — Patient Instructions (Signed)

## 2022-09-20 NOTE — Telephone Encounter (Signed)
We saw this patient today and she reports her repatha is $1,500. Can you help with this please?

## 2022-09-25 ENCOUNTER — Other Ambulatory Visit: Payer: Self-pay | Admitting: Cardiology

## 2022-09-25 MED ORDER — REPATHA SURECLICK 140 MG/ML ~~LOC~~ SOAJ: 140.0000 mg | SUBCUTANEOUS | 0 refills | Status: DC

## 2022-09-25 NOTE — Addendum Note (Signed)
Addended by: Tylene Fantasia on: 09/25/2022 09:29 AM   Modules accepted: Orders

## 2022-09-25 NOTE — Telephone Encounter (Signed)
Co pay is too high for patient. Samples provided. Patient received it on 09/25/2022

## 2022-09-26 ENCOUNTER — Telehealth: Payer: Self-pay | Admitting: Pharmacist

## 2022-09-26 LAB — LIPID PANEL
Chol/HDL Ratio: 2.1 ratio (ref 0.0–4.4)
Cholesterol, Total: 103 mg/dL (ref 100–199)
HDL: 48 mg/dL (ref >39)
LDL Chol Calc (NIH): 43 mg/dL (ref 0–99)
Triglycerides: 52 mg/dL (ref 0–149)
VLDL Cholesterol Cal: 12 mg/dL (ref 5–40)

## 2022-09-26 NOTE — Telephone Encounter (Signed)
Last LDLc 120 after addition of Zetia and Repatha to Crestor LDLc 43 ( goal <55)   Call to discuss the result. N/A LVm

## 2022-09-28 ENCOUNTER — Other Ambulatory Visit: Payer: Self-pay | Admitting: Cardiology

## 2022-10-31 ENCOUNTER — Telehealth: Payer: Self-pay | Admitting: Pharmacist

## 2022-10-31 NOTE — Telephone Encounter (Signed)
Enrolled patient in [employer] grant for Repatha. Called pt to give her information. LVM for her to call back.  Card No. [id number]  BIN [id number]  PCN [id number]  PC Group [id number]

## 2022-11-06 NOTE — Telephone Encounter (Signed)
Attempted to call pt, but does not have a VM box set up

## 2023-01-08 ENCOUNTER — Ambulatory Visit (HOSPITAL_BASED_OUTPATIENT_CLINIC_OR_DEPARTMENT_OTHER)
Admission: RE | Admit: 2023-01-08 | Discharge: 2023-01-08 | Disposition: A | Discharge status: Home / Self Care | Payer: Medicare (Managed Care) | Source: Ambulatory Visit | Attending: Family Medicine | Admitting: Family Medicine

## 2023-01-08 ENCOUNTER — Other Ambulatory Visit (HOSPITAL_BASED_OUTPATIENT_CLINIC_OR_DEPARTMENT_OTHER): Payer: Self-pay | Admitting: Family Medicine

## 2023-01-08 DIAGNOSIS — R059 Cough, unspecified: Secondary | ICD-10-CM | POA: Diagnosis present

## 2023-02-01 ENCOUNTER — Other Ambulatory Visit: Payer: Self-pay

## 2023-02-01 MED ORDER — CLOPIDOGREL BISULFATE 75 MG PO TABS: 75.0000 mg | ORAL_TABLET | Freq: Every day | ORAL | 2 refills | Status: DC

## 2023-04-19 ENCOUNTER — Other Ambulatory Visit: Payer: Self-pay | Admitting: Cardiology

## 2023-04-19 DIAGNOSIS — I25118 Atherosclerotic heart disease of native coronary artery with other forms of angina pectoris: Secondary | ICD-10-CM

## 2023-04-19 DIAGNOSIS — E785 Hyperlipidemia, unspecified: Secondary | ICD-10-CM
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
## 2023-05-09 ENCOUNTER — Other Ambulatory Visit: Payer: Self-pay | Admitting: *Deleted

## 2023-05-09 ENCOUNTER — Ambulatory Visit (INDEPENDENT_AMBULATORY_CARE_PROVIDER_SITE_OTHER): Payer: Medicare (Managed Care) | Admitting: Physician Assistant

## 2023-05-09 ENCOUNTER — Encounter: Payer: Self-pay | Admitting: Physician Assistant

## 2023-05-09 VITALS — BP 129/78 | HR 77 | Ht 64.0 in | Wt 269.2 lb

## 2023-05-09 DIAGNOSIS — E1165 Type 2 diabetes mellitus with hyperglycemia: Secondary | ICD-10-CM

## 2023-05-09 DIAGNOSIS — I25118 Atherosclerotic heart disease of native coronary artery with other forms of angina pectoris: Secondary | ICD-10-CM

## 2023-05-09 DIAGNOSIS — Z1211 Encounter for screening for malignant neoplasm of colon: Secondary | ICD-10-CM

## 2023-05-09 DIAGNOSIS — Z7985 Long-term (current) use of injectable non-insulin antidiabetic drugs: Secondary | ICD-10-CM

## 2023-05-09 DIAGNOSIS — Z8701 Personal history of pneumonia (recurrent): Secondary | ICD-10-CM | POA: Diagnosis not present

## 2023-05-09 DIAGNOSIS — I152 Hypertension secondary to endocrine disorders: Secondary | ICD-10-CM

## 2023-05-09 DIAGNOSIS — Z1231 Encounter for screening mammogram for malignant neoplasm of breast: Secondary | ICD-10-CM

## 2023-05-09 DIAGNOSIS — E785 Hyperlipidemia, unspecified: Secondary | ICD-10-CM

## 2023-05-09 DIAGNOSIS — E1159 Type 2 diabetes mellitus with other circulatory complications: Secondary | ICD-10-CM

## 2023-05-09 DIAGNOSIS — Z7984 Long term (current) use of oral hypoglycemic drugs: Secondary | ICD-10-CM

## 2023-05-09 NOTE — Progress Notes (Signed)
 Orders from Dr Audery Blazing were released in error in [place]'s office visit today. Future orders were placed back in EPIC.

## 2023-05-09 NOTE — Assessment & Plan Note (Signed)
 Pt was managing w/ crestor , zetia , repatha  -- she d/c repatha  2/2 to cost.  She is overdue for f/u with cardiology, advise discussing with them, she recalls a grant program to help cover cost. Ordering lipids, cmp today

## 2023-05-09 NOTE — Assessment & Plan Note (Signed)
 Ordering A1c, uacr today. On statin.  No ace/arb.  Manages with glipizide 10 mg , ozempic 0.25 mg with plans to titrate to 0.5 mg . Likely will need 1 mg if not for A1c control, for further weight management.

## 2023-05-09 NOTE — Assessment & Plan Note (Signed)
 Well controlled. Cont amlodipine  10 mg and labetaolol 400 mg bid Ordering cmp  F/u 6 mo

## 2023-05-10 ENCOUNTER — Other Ambulatory Visit: Payer: Self-pay | Admitting: Physician Assistant

## 2023-05-10 DIAGNOSIS — R748 Abnormal levels of other serum enzymes: Secondary | ICD-10-CM

## 2023-05-10 DIAGNOSIS — E1165 Type 2 diabetes mellitus with hyperglycemia: Secondary | ICD-10-CM

## 2023-05-10 LAB — CBC WITH DIFFERENTIAL/PLATELET
Basophils Absolute: 0 10*3/uL (ref 0.0–0.1)
Basophils Relative: 0.6 % (ref 0.0–3.0)
Eosinophils Absolute: 0.2 10*3/uL (ref 0.0–0.7)
Eosinophils Relative: 3.8 % (ref 0.0–5.0)
HCT: 40 % (ref 36.0–46.0)
Hemoglobin: 12.4 g/dL (ref 12.0–15.0)
Lymphocytes Relative: 37.7 % (ref 12.0–46.0)
Lymphs Abs: 1.5 10*3/uL (ref 0.7–4.0)
MCHC: 31.1 g/dL (ref 30.0–36.0)
MCV: 73.8 fl — ABNORMAL LOW (ref 78.0–100.0)
Monocytes Absolute: 0.6 10*3/uL (ref 0.1–1.0)
Monocytes Relative: 14.4 % — ABNORMAL HIGH (ref 3.0–12.0)
Neutro Abs: 1.8 10*3/uL (ref 1.4–7.7)
Neutrophils Relative %: 43.5 % (ref 43.0–77.0)
Platelets: 209 10*3/uL (ref 150.0–400.0)
RBC: 5.41 Mil/uL — ABNORMAL HIGH (ref 3.87–5.11)
RDW: 16.5 % — ABNORMAL HIGH (ref 11.5–15.5)
WBC: 4.1 10*3/uL (ref 4.0–10.5)

## 2023-05-10 LAB — MICROALBUMIN / CREATININE URINE RATIO
Creatinine,U: 225.6 mg/dL
Microalb Creat Ratio: 23.9 mg/g (ref 0.0–30.0)
Microalb, Ur: 5.4 mg/dL — ABNORMAL HIGH (ref 0.0–1.9)

## 2023-05-10 LAB — HEMOGLOBIN A1C: Hgb A1c MFr Bld: 9.1 % — ABNORMAL HIGH (ref 4.6–6.5)

## 2023-05-10 LAB — COMPREHENSIVE METABOLIC PANEL WITH GFR
ALT: 218 U/L — ABNORMAL HIGH (ref 0–35)
AST: 115 U/L — ABNORMAL HIGH (ref 0–37)
Albumin: 3.9 g/dL (ref 3.5–5.2)
Alkaline Phosphatase: 134 U/L — ABNORMAL HIGH (ref 39–117)
BUN: 14 mg/dL (ref 6–23)
CO2: 30 meq/L (ref 19–32)
Calcium: 8.8 mg/dL (ref 8.4–10.5)
Chloride: 107 meq/L (ref 96–112)
Creatinine, Ser: 1 mg/dL (ref 0.40–1.20)
GFR: 55.24 mL/min — ABNORMAL LOW (ref >60.00)
Glucose, Bld: 98 mg/dL (ref 70–99)
Potassium: 4.5 meq/L (ref 3.5–5.1)
Sodium: 143 meq/L (ref 135–145)
Total Bilirubin: 0.6 mg/dL (ref 0.2–1.2)
Total Protein: 6 g/dL (ref 6.0–8.3)

## 2023-05-10 MED ORDER — SEMAGLUTIDE (1 MG/DOSE) 4 MG/3ML ~~LOC~~ SOPN: 1.0000 mg | PEN_INJECTOR | SUBCUTANEOUS | 3 refills | Status: DC

## 2023-05-15 ENCOUNTER — Other Ambulatory Visit (INDEPENDENT_AMBULATORY_CARE_PROVIDER_SITE_OTHER): Payer: Medicare (Managed Care)

## 2023-05-15 DIAGNOSIS — R748 Abnormal levels of other serum enzymes: Secondary | ICD-10-CM

## 2023-05-15 LAB — COMPREHENSIVE METABOLIC PANEL WITH GFR
ALT: 108 U/L — ABNORMAL HIGH (ref 0–35)
AST: 48 U/L — ABNORMAL HIGH (ref 0–37)
Albumin: 3.9 g/dL (ref 3.5–5.2)
Alkaline Phosphatase: 122 U/L — ABNORMAL HIGH (ref 39–117)
BUN: 16 mg/dL (ref 6–23)
CO2: 28 meq/L (ref 19–32)
Calcium: 8.8 mg/dL (ref 8.4–10.5)
Chloride: 108 meq/L (ref 96–112)
Creatinine, Ser: 0.93 mg/dL (ref 0.40–1.20)
GFR: 60.26 mL/min (ref >60.00)
Glucose, Bld: 92 mg/dL (ref 70–99)
Potassium: 4 meq/L (ref 3.5–5.1)
Sodium: 143 meq/L (ref 135–145)
Total Bilirubin: 0.6 mg/dL (ref 0.2–1.2)
Total Protein: 5.7 g/dL — ABNORMAL LOW (ref 6.0–8.3)

## 2023-05-15 LAB — IBC + FERRITIN
Ferritin: 72.3 ng/mL (ref 10.0–291.0)
Iron: 97 ug/dL (ref 42–145)
Saturation Ratios: 26.6 % (ref 20.0–50.0)
TIBC: 364 ug/dL (ref 250.0–450.0)
Transferrin: 260 mg/dL (ref 212.0–360.0)

## 2023-05-15 LAB — TSH: TSH: 2.39 u[IU]/mL (ref 0.35–5.50)

## 2023-05-15 LAB — GAMMA GT: GGT: 43 U/L (ref 7–51)

## 2023-05-16 ENCOUNTER — Ambulatory Visit: Payer: Self-pay | Admitting: Physician Assistant

## 2023-05-16 DIAGNOSIS — R7401 Elevation of levels of liver transaminase levels: Secondary | ICD-10-CM

## 2023-05-16 LAB — HEPATITIS A ANTIBODY, IGM: Hep A IgM: NONREACTIVE

## 2023-05-16 LAB — HEPATITIS B SURFACE ANTIGEN: Hepatitis B Surface Ag: NONREACTIVE

## 2023-05-16 LAB — HEPATITIS C ANTIBODY: Hepatitis C Ab: NONREACTIVE

## 2023-05-25 ENCOUNTER — Ambulatory Visit (HOSPITAL_BASED_OUTPATIENT_CLINIC_OR_DEPARTMENT_OTHER)
Admission: RE | Admit: 2023-05-25 | Discharge: 2023-05-25 | Disposition: A | Discharge status: Home / Self Care | Payer: Medicare (Managed Care) | Source: Ambulatory Visit | Attending: Physician Assistant | Admitting: Physician Assistant

## 2023-05-25 DIAGNOSIS — R748 Abnormal levels of other serum enzymes: Secondary | ICD-10-CM | POA: Diagnosis present

## 2023-05-29 ENCOUNTER — Other Ambulatory Visit (INDEPENDENT_AMBULATORY_CARE_PROVIDER_SITE_OTHER): Payer: Medicare (Managed Care)

## 2023-05-29 DIAGNOSIS — R7401 Elevation of levels of liver transaminase levels: Secondary | ICD-10-CM

## 2023-05-29 LAB — HEPATIC FUNCTION PANEL
ALT: 134 U/L — ABNORMAL HIGH (ref 0–35)
AST: 90 U/L — ABNORMAL HIGH (ref 0–37)
Albumin: 4.2 g/dL (ref 3.5–5.2)
Alkaline Phosphatase: 122 U/L — ABNORMAL HIGH (ref 39–117)
Bilirubin, Direct: 0.2 mg/dL (ref 0.0–0.3)
Total Bilirubin: 0.6 mg/dL (ref 0.2–1.2)
Total Protein: 6.3 g/dL (ref 6.0–8.3)

## 2023-05-30 ENCOUNTER — Ambulatory Visit: Payer: Self-pay | Admitting: Physician Assistant

## 2023-05-30 DIAGNOSIS — K760 Fatty (change of) liver, not elsewhere classified: Secondary | ICD-10-CM | POA: Insufficient documentation

## 2023-06-12 ENCOUNTER — Telehealth: Payer: Self-pay | Admitting: Physician Assistant

## 2023-06-12 NOTE — Telephone Encounter (Signed)
 Copied from CRM [id number]. Topic: Medicare AWV >> Jun 12, 2023 11:15 AM Juliana Ocean wrote: Reason for CRM: LVM 06/12/2023 to schedule AWV. Please schedule Virtual or Telehealth visits ONLY.   Rosalee Collins; Care Guide Ambulatory Clinical Support [place] [facility] Direct Dial: [phone]
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
## 2023-06-18 ENCOUNTER — Encounter: Payer: Self-pay | Admitting: Cardiology

## 2023-06-18 ENCOUNTER — Ambulatory Visit: Payer: Medicare (Managed Care) | Attending: Cardiology | Admitting: Cardiology

## 2023-06-18 ENCOUNTER — Ambulatory Visit (HOSPITAL_COMMUNITY)
Admission: RE | Admit: 2023-06-18 | Discharge: 2023-06-18 | Disposition: A | Discharge status: Home / Self Care | Payer: Medicare (Managed Care) | Source: Ambulatory Visit | Attending: Physician Assistant

## 2023-06-18 ENCOUNTER — Ambulatory Visit (HOSPITAL_COMMUNITY): Payer: Medicare (Managed Care)

## 2023-06-18 VITALS — BP 128/84 | HR 81 | Ht 64.0 in | Wt 260.0 lb

## 2023-06-18 DIAGNOSIS — R9389 Abnormal findings on diagnostic imaging of other specified body structures: Secondary | ICD-10-CM

## 2023-06-18 DIAGNOSIS — Z8701 Personal history of pneumonia (recurrent): Secondary | ICD-10-CM | POA: Insufficient documentation

## 2023-06-18 DIAGNOSIS — I25118 Atherosclerotic heart disease of native coronary artery with other forms of angina pectoris: Secondary | ICD-10-CM | POA: Diagnosis not present

## 2023-06-18 DIAGNOSIS — R0602 Shortness of breath: Secondary | ICD-10-CM | POA: Insufficient documentation

## 2023-06-18 DIAGNOSIS — I10 Essential (primary) hypertension: Secondary | ICD-10-CM

## 2023-06-18 DIAGNOSIS — E785 Hyperlipidemia, unspecified: Secondary | ICD-10-CM | POA: Diagnosis not present

## 2023-06-18 MED ORDER — REPATHA SURECLICK 140 MG/ML ~~LOC~~ SOAJ: 140.0000 mg | SUBCUTANEOUS | 1 refills | Status: DC

## 2023-06-18 NOTE — Patient Instructions (Signed)
 Medication Instructions:   RESTART REPATHA   STOP PLAVIX   STOP ROSUVASTATIN   *If you need a refill on your cardiac medications before your next appointment, please call your pharmacy*  Lab Work:  Your physician recommends that you return for lab work in: [facility]  If you have labs (blood work) drawn today and your tests are completely normal, you will receive your results only by: MyChart Message (if you have MyChart) OR A paper copy in the mail If you have any lab test that is abnormal or we need to change your treatment, we will call you to review the results.  Testing/Procedures:  A chest x-ray takes a picture of the organs and structures inside the chest, including the heart, lungs, and blood vessels. This test can show several things, including, whether the heart is enlarges; whether fluid is building up in the lungs; and whether pacemaker / defibrillator leads are still in place. [address]  Follow-Up: At [facility], you and your health needs are our priority.  As part of our continuing mission to provide you with exceptional heart care, our providers are all part of one team.  This team includes your primary Cardiologist (physician) and Advanced Practice Providers or APPs (Physician Assistants and Nurse Practitioners) who all work together to provide you with the care you need, when you need it.  Your next appointment:   6 month(s)  Provider:   Alexandria Angel, MD

## 2023-06-19 ENCOUNTER — Ambulatory Visit: Payer: Self-pay | Admitting: Physician Assistant

## 2023-06-25 ENCOUNTER — Telehealth: Payer: Self-pay | Admitting: Cardiology

## 2023-06-25 DIAGNOSIS — E785 Hyperlipidemia, unspecified: Secondary | ICD-10-CM

## 2023-06-25 DIAGNOSIS — I25118 Atherosclerotic heart disease of native coronary artery with other forms of angina pectoris: Secondary | ICD-10-CM

## 2023-06-25 NOTE — Telephone Encounter (Signed)
*  STAT* If patient is at the pharmacy, call can be transferred to refill team.   1. Which medications need to be refilled? (please list name of each medication and dose if known)   Evolocumab  (REPATHA  SURECLICK) 140 MG/ML SOAJ     2. Would you like to learn more about the convenience, safety, & potential cost savings by using the [facility]?      3. Are you open to using the [facility] (Type [facility].  ).   4. Which pharmacy/location (including street and city if local pharmacy) is medication to be sent to? [facility] - [place], [place] - [address]    5. Do they need a 30 day or 90 day supply? 30 day

## 2023-06-26 MED ORDER — REPATHA SURECLICK 140 MG/ML ~~LOC~~ SOAJ: 140.0000 mg | SUBCUTANEOUS | 11 refills | Status: DC

## 2023-07-20 ENCOUNTER — Telehealth: Payer: Self-pay | Admitting: Physician Assistant

## 2023-07-20 ENCOUNTER — Ambulatory Visit: Payer: Medicare (Managed Care) | Admitting: Nurse Practitioner

## 2023-07-20 NOTE — Telephone Encounter (Signed)
 Copied from CRM [id number]. Topic: Medicare AWV >> Jul 20, 2023  2:08 PM Nathanel DEL wrote: Reason for CRM: Called LVM 07/19/2023 to schedule AWV. Please schedule Virtual or Telehealth visits ONLY.   Nathanel Paschal; Care Guide Ambulatory Clinical Support [place] [facility] Direct Dial: [phone]

## 2023-08-23 ENCOUNTER — Other Ambulatory Visit: Payer: Self-pay | Admitting: Cardiology

## 2023-08-27 ENCOUNTER — Other Ambulatory Visit: Payer: Self-pay | Admitting: Physician Assistant

## 2023-08-27 DIAGNOSIS — E1165 Type 2 diabetes mellitus with hyperglycemia: Secondary | ICD-10-CM

## 2023-08-27 DIAGNOSIS — E785 Hyperlipidemia, unspecified: Secondary | ICD-10-CM

## 2023-08-27 DIAGNOSIS — I25118 Atherosclerotic heart disease of native coronary artery with other forms of angina pectoris: Secondary | ICD-10-CM

## 2023-08-27 NOTE — Telephone Encounter (Signed)
 Copied from CRM #[id number]. Topic: Clinical - Medication Refill >> Aug 27, 2023 11:08 AM [place] J wrote: Medication:  labetalol  (NORMODYNE ) 200 MG tablet glipiZIDE  (GLUCOTROL ) 10 MG tablet Semaglutide , 1 MG/DOSE, 4 MG/3ML SOPN ezetimibe  (ZETIA ) 10 MG tablet amLODipine  (NORVASC ) 10 MG tablet Evolocumab  (REPATHA  SURECLICK) 140 MG/ML SOAJ  Has the patient contacted their pharmacy? No (Agent: If no, request that the patient contact the pharmacy for the refill. If patient does not wish to contact the pharmacy document the reason why and proceed with request.) (Agent: If yes, when and what did the pharmacy advise?)  This is the patient's preferred pharmacy:  [facility] - [place], [place] - [address] [place] [address] Phone: [phone] Fax: [phone] Hours: Not open 24 hours  Is this the correct pharmacy for this prescription? Yes If no, delete pharmacy and type the correct one.   Has the prescription been filled recently? No  Is the patient out of the medication? No  Has the patient been seen for an appointment in the last year OR does the patient have an upcoming appointment? Yes  Can we respond through MyChart? Yes  Agent: Please be advised that Rx refills may take up to 3 business days. We ask that you follow-up with your pharmacy.

## 2023-08-31 MED ORDER — GLIPIZIDE 10 MG PO TABS: 10.0000 mg | ORAL_TABLET | Freq: Every morning | ORAL | 1 refills | Status: AC

## 2023-08-31 MED ORDER — REPATHA SURECLICK 140 MG/ML ~~LOC~~ SOAJ: 140.0000 mg | SUBCUTANEOUS | 11 refills | Status: DC

## 2023-08-31 MED ORDER — AMLODIPINE BESYLATE 10 MG PO TABS: 10.0000 mg | ORAL_TABLET | Freq: Every morning | ORAL | 1 refills | Status: AC

## 2023-08-31 MED ORDER — LABETALOL HCL 200 MG PO TABS: 400.0000 mg | ORAL_TABLET | Freq: Two times a day (BID) | ORAL | 1 refills | Status: DC

## 2023-08-31 MED ORDER — SEMAGLUTIDE (1 MG/DOSE) 4 MG/3ML ~~LOC~~ SOPN: 1.0000 mg | PEN_INJECTOR | SUBCUTANEOUS | 3 refills | Status: AC

## 2023-09-11 ENCOUNTER — Other Ambulatory Visit (INDEPENDENT_AMBULATORY_CARE_PROVIDER_SITE_OTHER): Payer: Medicare (Managed Care)

## 2023-09-11 ENCOUNTER — Ambulatory Visit: Payer: Medicare (Managed Care) | Admitting: Nurse Practitioner

## 2023-09-11 ENCOUNTER — Encounter: Payer: Self-pay | Admitting: Nurse Practitioner

## 2023-09-11 VITALS — BP 146/88 | HR 74 | Ht 62.5 in | Wt 275.0 lb

## 2023-09-11 DIAGNOSIS — R7989 Other specified abnormal findings of blood chemistry: Secondary | ICD-10-CM | POA: Diagnosis not present

## 2023-09-11 DIAGNOSIS — Z9884 Bariatric surgery status: Secondary | ICD-10-CM

## 2023-09-11 DIAGNOSIS — Z860100 Personal history of colon polyps, unspecified: Secondary | ICD-10-CM

## 2023-09-11 DIAGNOSIS — Z860101 Personal history of adenomatous and serrated colon polyps: Secondary | ICD-10-CM | POA: Diagnosis not present

## 2023-09-11 DIAGNOSIS — K828 Other specified diseases of gallbladder: Secondary | ICD-10-CM | POA: Diagnosis not present

## 2023-09-11 LAB — CBC WITH DIFFERENTIAL/PLATELET
Basophils Absolute: 0 K/uL (ref 0.0–0.1)
Basophils Relative: 0.4 % (ref 0.0–3.0)
Eosinophils Absolute: 0.1 K/uL (ref 0.0–0.7)
Eosinophils Relative: 1.8 % (ref 0.0–5.0)
HCT: 39.3 % (ref 36.0–46.0)
Hemoglobin: 12.4 g/dL (ref 12.0–15.0)
Lymphocytes Relative: 26.4 % (ref 12.0–46.0)
Lymphs Abs: 1.3 K/uL (ref 0.7–4.0)
MCHC: 31.6 g/dL (ref 30.0–36.0)
MCV: 72.2 fl — ABNORMAL LOW (ref 78.0–100.0)
Monocytes Absolute: 0.7 K/uL (ref 0.1–1.0)
Monocytes Relative: 13.9 % — ABNORMAL HIGH (ref 3.0–12.0)
Neutro Abs: 2.8 K/uL (ref 1.4–7.7)
Neutrophils Relative %: 57.5 % (ref 43.0–77.0)
Platelets: 190 K/uL (ref 150.0–400.0)
RBC: 5.45 Mil/uL — ABNORMAL HIGH (ref 3.87–5.11)
RDW: 18 % — ABNORMAL HIGH (ref 11.5–15.5)
WBC: 4.8 K/uL (ref 4.0–10.5)

## 2023-09-11 LAB — BASIC METABOLIC PANEL WITH GFR
BUN: 21 mg/dL (ref 6–23)
CO2: 31 meq/L (ref 19–32)
Calcium: 9.3 mg/dL (ref 8.4–10.5)
Chloride: 107 meq/L (ref 96–112)
Creatinine, Ser: 0.84 mg/dL (ref 0.40–1.20)
GFR: 67.93 mL/min (ref >60.00)
Glucose, Bld: 125 mg/dL — ABNORMAL HIGH (ref 70–99)
Potassium: 4 meq/L (ref 3.5–5.1)
Sodium: 144 meq/L (ref 135–145)

## 2023-09-11 LAB — HEPATIC FUNCTION PANEL
ALT: 17 U/L (ref 0–35)
AST: 15 U/L (ref 0–37)
Albumin: 3.9 g/dL (ref 3.5–5.2)
Alkaline Phosphatase: 84 U/L (ref 39–117)
Bilirubin, Direct: 0.1 mg/dL (ref 0.0–0.3)
Total Bilirubin: 0.5 mg/dL (ref 0.2–1.2)
Total Protein: 6.5 g/dL (ref 6.0–8.3)

## 2023-09-11 NOTE — Patient Instructions (Signed)
 Your provider has requested that you go to the basement level for lab work before leaving today. Press B on the elevator. The lab is located at the first door on the left as you exit the elevator.  Due to recent changes in healthcare laws, you may see the results of your imaging and laboratory studies on MyChart before your provider has had a chance to review them.  We understand that in some cases there may be results that are confusing or concerning to you. Not all laboratory results come back in the same time frame and the provider may be waiting for multiple results in order to interpret others.  Please give us  48 hours in order for your provider to thoroughly review all the results before contacting the office for clarification of your results.    It has been recommended to you by your physician that you have a colonoscopy completed. Per your request, we did not schedule the procedure today. Please contact our office at [phone] should you decide to have the procedure completed. You will be scheduled for a pre-visit and procedure at that time.  Once scheduled, you will not take the Ozempic  1 week prior to the colonoscopy.  Follow up with your cardiologist for shortness of breath and leg swelling.  Thank you for trusting me with your gastrointestinal care!   Elida Shawl, CRNP    _______________________________________________________  If your blood pressure at your visit was 140/90 or greater, please contact your primary care physician to follow up on this.  _______________________________________________________  If you are age 54 or older, your body mass index should be between 23-30. Your Body mass index is 49.5 kg/m. If this is out of the aforementioned range listed, please consider follow up with your Primary Care Provider.  If you are age 42 or younger, your body mass index should be between 19-25. Your Body mass index is 49.5 kg/m. If this is out of the  aformentioned range listed, please consider follow up with your Primary Care Provider.   ________________________________________________________  The [facility] providers would like to encourage you to use MYCHART to communicate with providers for non-urgent requests or questions.  Due to long hold times on the telephone, sending your provider a message by [facility] may be a faster and more efficient way to get a response.  Please allow 48 business hours for a response.  Please remember that this is for non-urgent requests.  _______________________________________________________  [facility] is using a team-based approach to care.  Your team is made up of your doctor and two to three APPS. Our APPS (Nurse Practitioners and Physician Assistants) work with your physician to ensure care continuity for you. They are fully qualified to address your health concerns and develop a treatment plan. They communicate directly with your gastroenterologist to care for you. Seeing the Advanced Practice Practitioners on your physician's team can help you by facilitating care more promptly, often allowing for earlier appointments, access to diagnostic testing, procedures, and other specialty referrals.
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
## 2023-09-12 ENCOUNTER — Ambulatory Visit: Payer: Self-pay | Admitting: Nurse Practitioner

## 2023-09-12 NOTE — Progress Notes (Signed)
 Agree with assessment and plan as outlined.

## 2023-09-16 LAB — MITOCHONDRIAL ANTIBODIES: Mitochondrial M2 Ab, IgG: <=20 U (ref <=20.0)

## 2023-09-16 LAB — ANTI-SMOOTH MUSCLE ANTIBODY, IGG: Actin (Smooth Muscle) Antibody (IGG): <20 U (ref <20)

## 2023-09-16 LAB — CERULOPLASMIN: Ceruloplasmin: 28 mg/dL (ref 14–48)

## 2023-09-16 LAB — HEPATITIS A ANTIBODY, TOTAL: Hepatitis A AB,Total: REACTIVE — AB

## 2023-09-16 LAB — ANA: Anti Nuclear Antibody (ANA): NEGATIVE

## 2023-09-16 LAB — HEPATITIS B CORE ANTIBODY, TOTAL: Hep B Core Total Ab: NONREACTIVE

## 2023-09-16 LAB — ALPHA-1-ANTITRYPSIN: A-1 Antitrypsin, Ser: 153 mg/dL (ref 83–199)

## 2023-09-16 LAB — HEPATITIS B SURFACE ANTIBODY,QUALITATIVE: Hep B S Ab: NONREACTIVE

## 2023-09-16 LAB — IGG: IgG (Immunoglobin G), Serum: 802 mg/dL (ref 600–1540)

## 2023-09-17 ENCOUNTER — Telehealth: Payer: Self-pay | Admitting: *Deleted

## 2023-09-17 NOTE — Telephone Encounter (Signed)
 Called patient unable to leave a VM due to VM not being set-up, patient would need to have old PCP give vaccine or Pharmacy

## 2023-09-17 NOTE — Telephone Encounter (Signed)
 Copied from CRM [id number]. Topic: Appointments - Scheduling Inquiry for Clinic >> Sep 17, 2023  3:29 PM [name] wrote: Reason for CRM: Pt went to gastro DR and they had bloodwork done with the liver enzyme tests. States they do not have an immunity to Hep B and told to follow up with PCP. She was a Virginia Flores, but has an appt in May with Dr. Mercer. Can we schedule her any sooner or can we place an order for the Hep B for her to have this done?  Flores callback is [phone] or the cell [phone]

## 2023-09-24 ENCOUNTER — Telehealth: Payer: Self-pay | Admitting: *Deleted

## 2023-09-24 ENCOUNTER — Telehealth: Payer: Self-pay

## 2023-09-24 NOTE — Telephone Encounter (Signed)
 Copied from CRM #[id number]. Topic: General - Other >> Sep 24, 2023  2:51 PM Virginia Flores wrote: Reason for CRM: Patient would like to know if she can have the Hep B vaccine done at our practice, she was a patient of Manuel[facility]ness and has not established care with a new Primary care provider but was referred to get the vaccine from [place].  Need order please

## 2023-09-24 NOTE — Telephone Encounter (Signed)
 [facility] Pre-operative Risk Assessment     Request for surgical clearance:     Endoscopy Procedure  What type of surgery is being performed?     COLONOSCOPY  When is this surgery scheduled?     TBD  What type of clearance is required ? CARDIAC CLEARANCE  Are there any medications that need to be held prior to surgery and how long?  NO  Practice name and name of physician performing surgery?      [facility] DR. STEVEN ARMBRUSTER  What is your office phone and fax number?      Phone- [phone]  Fax- [phone]  Anesthesia type (None, local, MAC, general) ?       MAC   Please route your response to Donnae Michels, CMA   Thank you

## 2023-09-24 NOTE — Telephone Encounter (Signed)
 Cardiac clearance sent to [facility]

## 2023-09-24 NOTE — Telephone Encounter (Signed)
   Name: Virginia Flores  DOB: 1950-10-05  MRN: [id number]  Primary Cardiologist: Redell Shallow, MD  Chart reviewed as part of pre-operative protocol coverage. She was last seen by Dr. Shallow in 06/2023. Patient is in need of a colonoscopy. This is a low risk procedure. However, at recent visit with GI on 09/11/2023, she reported increased lower extremity (right > left) and continued shortness of breath with exertion. Therefore, it may be better to do an in-office visit rather than a telehealth visit to better assess preoperative cardiovascular risk. Can you please help arrange this?  Pre-op covering staff: - Please schedule appointment and call patient to inform them. If patient already had an upcoming appointment within acceptable timeframe, please add pre-op clearance to the appointment notes so provider is aware. - Please contact requesting surgeon's office via preferred method (i.e, phone, fax) to inform them of need for appointment prior to surgery.   Todrick Siedschlag E Skye Rodarte, PA-C  09/24/2023, 11:17 AM

## 2023-09-24 NOTE — Telephone Encounter (Signed)
-----   Message from Elida CHRISTELLA Shawl sent at 09/21/2023  7:30 AM EDT ----- Regarding: RE: colonoscopy at [facility] with Dr. DELENA Heinrich, since she has not yet seen cardiology, pls send official request for cardiac clearance. Cardiac clearance to be received prior to the patient scheduling a colonoscopy at [facility]. THX. ----- Message ----- From: Edmundo Clarita CHRISTELLA, CMA Sent: 09/18/2023   4:54 PM EDT To: Elspeth SHAUNNA Naval, MD; Elida CHRISTELLA Kennedy-S# Subject: colonoscopy at [facility] with Dr. DELENA Elida - I see this patient on Armbruster's hospital list but it looks like she declined being scheduled at this time?   Is she going to see Cardiology first and then reach out to us  when she is cleared and ready to schedule?   Or we to request Cardiac clearance now?    Thank you! Jan

## 2023-09-24 NOTE — Telephone Encounter (Signed)
 1st attempt : Called patient to contact our office to schedule telehealth visit for cardiac clearance.

## 2023-09-25 NOTE — Telephone Encounter (Signed)
2ND attempt to reach pt regarding surgical clearance and the need for an IN OFFICE appointment.  Left pt a detailed message to call back and get that scheduled.

## 2023-09-26 NOTE — Telephone Encounter (Signed)
 I s/w the pt and she tells me that she has health problems to take care 1st before proceeding with colonoscopy. Pt said they are looking to do colonoscopy 01/2024. Pt said she would like to see Dr. Pietro for the preop clearance. Pt has been scheduled to see MD 12/13/23 @ 4:20 for her 6 month f/u and for preop clearance.   I will also have the requesting office to send a new request by the end of 11/2023 to be sure that we have an update preop request for the GI procedure to be done. Pt thanked me for the help today.

## 2023-09-26 NOTE — Telephone Encounter (Signed)
 Okay, thanks very much

## 2023-09-26 NOTE — Telephone Encounter (Signed)
 LMOM advising pt to call to schedule nurse visit.

## 2023-11-19 ENCOUNTER — Other Ambulatory Visit: Payer: Self-pay | Admitting: Cardiology

## 2023-12-05 NOTE — Telephone Encounter (Signed)
 [facility] Pre-operative Risk Assessment     Request for surgical clearance:     Endoscopy Procedure  What type of surgery is being performed?   colonoscopy  When is this surgery scheduled?     TBD  What type of clearance is required ?  CARDIAC  Are there any medications that need to be held prior to surgery and how long? N/A  Practice name and name of physician performing surgery?    DR ELSPETH LEIGH [facility]  What is your office phone and fax number?      Phone- [phone]  Fax- [phone]  Anesthesia type (None, local, MAC, general) ?       MAC   Please route your response to Zariya Minner, CMA
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
## 2023-12-13 ENCOUNTER — Telehealth: Payer: Self-pay

## 2023-12-13 ENCOUNTER — Encounter: Payer: Self-pay | Admitting: Cardiology

## 2023-12-13 ENCOUNTER — Ambulatory Visit: Payer: Medicare (Managed Care) | Attending: Internal Medicine | Admitting: Cardiology

## 2023-12-13 ENCOUNTER — Other Ambulatory Visit (HOSPITAL_BASED_OUTPATIENT_CLINIC_OR_DEPARTMENT_OTHER): Payer: Self-pay

## 2023-12-13 ENCOUNTER — Other Ambulatory Visit: Payer: Self-pay

## 2023-12-13 VITALS — BP 150/85 | HR 67 | Ht 62.5 in | Wt 302.0 lb

## 2023-12-13 DIAGNOSIS — R7989 Other specified abnormal findings of blood chemistry: Secondary | ICD-10-CM

## 2023-12-13 DIAGNOSIS — I25118 Atherosclerotic heart disease of native coronary artery with other forms of angina pectoris: Secondary | ICD-10-CM

## 2023-12-13 DIAGNOSIS — Z860100 Personal history of colon polyps, unspecified: Secondary | ICD-10-CM

## 2023-12-13 DIAGNOSIS — E785 Hyperlipidemia, unspecified: Secondary | ICD-10-CM | POA: Diagnosis not present

## 2023-12-13 DIAGNOSIS — I10 Essential (primary) hypertension: Secondary | ICD-10-CM | POA: Diagnosis not present

## 2023-12-13 DIAGNOSIS — R0609 Other forms of dyspnea: Secondary | ICD-10-CM | POA: Diagnosis not present

## 2023-12-13 DIAGNOSIS — R9389 Abnormal findings on diagnostic imaging of other specified body structures: Secondary | ICD-10-CM

## 2023-12-13 MED ORDER — LOSARTAN POTASSIUM 50 MG PO TABS: 50.0000 mg | ORAL_TABLET | Freq: Every day | ORAL | 3 refills | Status: AC

## 2023-12-13 MED ORDER — REPATHA SURECLICK 140 MG/ML ~~LOC~~ SOAJ: 140.0000 mg | SUBCUTANEOUS | 11 refills | Status: DC

## 2023-12-13 MED ORDER — NA SULFATE-K SULFATE-MG SULF 17.5-3.13-1.6 GM/177ML PO SOLN: 1.0000 | Freq: Once | ORAL | 0 refills | Status: AC

## 2023-12-13 MED ORDER — FUROSEMIDE 20 MG PO TABS: 20.0000 mg | ORAL_TABLET | Freq: Every day | ORAL | 3 refills | Status: AC

## 2023-12-13 MED ORDER — REPATHA SURECLICK 140 MG/ML ~~LOC~~ SOAJ
140.0000 mg | SUBCUTANEOUS | 11 refills | Status: AC
  Filled 2023-12-13 – 2024-01-01 (×4): qty 2, 28d supply, fill #0

## 2023-12-13 NOTE — Addendum Note (Signed)
 Addended by: RICHIE ADRIEN ORN on: 12/13/2023 05:00 PM   Modules accepted: Orders

## 2023-12-13 NOTE — Telephone Encounter (Signed)
-----   Message from [facility] Amori Cooperman S sent at 09/11/2023 11:00 AM EDT ----- Regarding: Schedule colon & PreOp Schedule Virginia Flores for a colonoscopy with Dr. Leigh.  Send cardiac clearance request to Dr. Pietro.  Schedule preOp visit.  Patient will need to hold diabetic medications also.

## 2023-12-13 NOTE — Telephone Encounter (Signed)
 Patient is scheduled for 02/18/24 at [facility].  I am resending clearance.

## 2023-12-13 NOTE — Telephone Encounter (Signed)
 I left a message stating that I was scheduling her for her colonoscopy with Dr. Leigh on 02/18/24 at [facility].  She had discussed at her visit in September to schedule for January but he didn't have anything available.  I advised her that the instructions will be in her MyChart and I will follow up to discuss.

## 2023-12-13 NOTE — Telephone Encounter (Signed)
 Unable to leave vml because it was full.

## 2023-12-13 NOTE — Addendum Note (Signed)
 Addended by: Clarine Elrod W on: 12/13/2023 04:54 PM   Modules accepted: Orders

## 2023-12-13 NOTE — Patient Instructions (Signed)
 Medication Instructions:   RESTART REPATHA   START LOSARTAN 50 MG ONCE DAILY  TAKE FUROSEMIDE  20 MG EVERYDAY  *If you need a refill on your cardiac medications before your next appointment, please call your pharmacy*  Lab Work:  Your physician recommends that you return for lab work in: ONE WEEK-DO NOT NEED TO FAST  Your physician recommends that you return for lab work in: 8 [facility]  If you have labs (blood work) drawn today and your tests are completely normal, you will receive your results only by: MyChart Message (if you have MyChart) OR A paper copy in the mail If you have any lab test that is abnormal or we need to change your treatment, we will call you to review the results.  Testing/Procedures:  Your physician has requested that you have an echocardiogram. Echocardiography is a painless test that uses sound waves to create images of your heart. It provides your doctor with information about the size and shape of your heart and how well your hearts chambers and valves are working. This procedure takes approximately one hour. There are no restrictions for this procedure. Please do NOT wear cologne, perfume, aftershave, or lotions (deodorant is allowed). Please arrive 15 minutes prior to your appointment time.  Please note: We ask at that you not bring children with you during ultrasound (echo/ vascular) testing. Due to room size and safety concerns, children are not allowed in the ultrasound rooms during exams. Our front office staff cannot provide observation of children in our lobby area while testing is being conducted. An adult accompanying a patient to their appointment will only be allowed in the ultrasound room at the discretion of the ultrasound technician under special circumstances. We apologize for any inconvenience. [address]  A chest x-ray takes a picture of the organs and structures inside the chest, including the heart, lungs, and blood vessels. This  test can show several things, including, whether the heart is enlarges; whether fluid is building up in the lungs; and whether pacemaker / defibrillator leads are still in place.  IMAGING [address]   Follow-Up: At [facility], you and your health needs are our priority.  As part of our continuing mission to provide you with exceptional heart care, our providers are all part of one team.  This team includes your primary Cardiologist (physician) and Advanced Practice Providers or APPs (Physician Assistants and Nurse Practitioners) who all work together to provide you with the care you need, when you need it.  Your next appointment:   3 month(s)  Provider:   Jon Hails, PA-C, Callie Goodrich, PA-C, Kathleen Johnson, PA-C, Michele Lenze, PA-C, Hao Meng, PA-C, Damien Braver, NP, Glendia Ferrier, PA-C, or Katlyn West, NP      Then, Redell Shallow, MD will plan to see you again in 6 month(s).

## 2023-12-13 NOTE — Telephone Encounter (Signed)
 Colonoscopy scheduled for 02/18/24 at 0835

## 2023-12-13 NOTE — Telephone Encounter (Signed)
 [facility] Pre-operative Risk Assessment     Request for surgical clearance:     Endoscopy Procedure  What type of surgery is being performed?     Colonoscopy  When is this surgery scheduled?     02/18/24  What type of clearance is required ?   Cardiac  Are there any medications that need to be held prior to surgery and how long? No meds, Cardiac clearance only  Practice name and name of physician performing surgery?      [facility]  What is your office phone and fax number?      Phone- [phone]  Fax- [phone]  Anesthesia type (None, local, MAC, general) ?       MAC   Please route your response to Sohan Potvin Trudi Goodnight, RMA

## 2023-12-14 NOTE — Telephone Encounter (Signed)
 Patient was cleared for procedure by Dr. Pietro on 12/13/2023.  Will route note to requesting office.

## 2023-12-14 NOTE — Telephone Encounter (Signed)
 Yes.  I was waiting to speak to the patient before I scheduled her pre-visit.

## 2023-12-17 ENCOUNTER — Ambulatory Visit: Payer: Medicare (Managed Care) | Admitting: Family Medicine

## 2023-12-17 ENCOUNTER — Ambulatory Visit: Payer: Self-pay

## 2023-12-17 ENCOUNTER — Other Ambulatory Visit: Payer: Self-pay | Admitting: Family Medicine

## 2023-12-17 ENCOUNTER — Ambulatory Visit
Admission: RE | Admit: 2023-12-17 | Discharge: 2023-12-17 | Disposition: A | Discharge status: Home / Self Care | Payer: Medicare (Managed Care) | Source: Ambulatory Visit | Attending: Family Medicine

## 2023-12-17 VITALS — BP 128/8 | HR 72 | Temp 97.7°F | Resp 20

## 2023-12-17 DIAGNOSIS — M5416 Radiculopathy, lumbar region: Secondary | ICD-10-CM | POA: Diagnosis not present

## 2023-12-17 DIAGNOSIS — M47816 Spondylosis without myelopathy or radiculopathy, lumbar region: Secondary | ICD-10-CM

## 2023-12-17 DIAGNOSIS — E1165 Type 2 diabetes mellitus with hyperglycemia: Secondary | ICD-10-CM

## 2023-12-17 DIAGNOSIS — K047 Periapical abscess without sinus: Secondary | ICD-10-CM

## 2023-12-17 MED ORDER — AMOXICILLIN-POT CLAVULANATE 875-125 MG PO TABS: 1.0000 | ORAL_TABLET | Freq: Two times a day (BID) | ORAL | 0 refills | Status: AC

## 2023-12-17 MED ORDER — PREDNISONE 20 MG PO TABS: ORAL_TABLET | ORAL | 0 refills | Status: AC

## 2023-12-17 MED ORDER — FAMOTIDINE 20 MG PO TABS: 20.0000 mg | ORAL_TABLET | Freq: Two times a day (BID) | ORAL | 0 refills | Status: AC

## 2023-12-17 NOTE — ED Triage Notes (Signed)
 Pt reports right sided low back pain x 3 weeks. Pain is worse when walking. Ibuprofen gives no relief.   Pt reports dental abscess x 3 days. Ibuprofen gives no relief.

## 2023-12-17 NOTE — Telephone Encounter (Signed)
 Wrong office

## 2023-12-17 NOTE — Discharge Instructions (Addendum)
 Start Augmentin  for your dental infection. Use prednisone  for 5 days to help with your lumbar radiculopathy. Continue efforts at establishing an appointment with a dentist and a spine specialist for follow up.   For diabetes or elevated blood sugar, please make sure you are limiting and avoiding starchy, carbohydrate foods like pasta, breads, sweet breads, pastry, rice, potatoes, desserts. These foods can elevate your blood sugar. Also, limit and avoid drinks that contain a lot of sugar such as sodas, sweet teas, fruit juices.  Drinking plain water  will be much more helpful, try 64 ounces of water  daily.  It is okay to flavor your water  naturally by cutting cucumber, lemon, mint or lime, placing it in a picture with water  and drinking it over a period of 24-48 hours as long as it remains refrigerated.  For elevated blood pressure, make sure you are monitoring salt in your diet.  Do not eat restaurant foods and limit processed foods at home. I highly recommend you prepare and cook your own foods at home.  Processed foods include things like frozen meals, pre-seasoned meats and dinners, deli meats, canned foods as these foods contain a high amount of sodium/salt.  Make sure you are paying attention to sodium labels on foods you buy at the grocery store. Buy your spices separately such as garlic powder, onion powder, cumin, cayenne, parsley flakes so that you can avoid seasonings that contain salt. However, salt-free seasonings are available and can be used, an example is Mrs. Dash and includes a lot of different mixtures that do not contain salt.  Lastly, when cooking using oils that are healthier for you is important. This includes olive oil, avocado oil, canola oil. We have discussed a lot of foods to avoid but below is a list of foods that can be very healthy to use in your diet whether it is for diabetes, cholesterol, high blood pressure, or in general healthy eating.  Salads - kale, spinach, cabbage,  spring mix, arugula Fruits - avocadoes, berries (blueberries, raspberries, blackberries), apples, oranges, pomegranate, grapefruit, kiwi Vegetables - asparagus, cauliflower, broccoli, green beans, brussel sprouts, bell peppers, beets; stay away from or limit starchy vegetables like potatoes, carrots, peas  DO NOT EAT ANY FOODS ON THIS LIST THAT YOU ARE ALLERGIC TO. For more specific needs, I highly recommend consulting a dietician or nutritionist but this can definitely be a good starting point.

## 2023-12-17 NOTE — Telephone Encounter (Signed)
 FYI Only or Action Required?: FYI only for provider: would like abx, doxycycline .  Patient was last seen in primary care on 05/09/2023 by Cyndi Shaver, PA-C.  Called Nurse Triage reporting Dental Injury.  Symptoms began several days ago.  Interventions attempted: OTC medications: advil.  Symptoms are: gradually worsening.  Triage Disposition: Call Dentist When Office is Open  Patient/caregiver understands and will follow disposition?: No, wishes to speak with PCP  Copied from CRM #[id number]. Topic: Clinical - Red Word Triage >> Dec 17, 2023  8:25 AM Harlene ORN wrote: Red Word that prompted transfer to Nurse Triage:  cracked a tooth friday and now has a huge abscess in her jaw Reason for Disposition  Can see a fracture line (crack) in the tooth  Answer Assessment - Initial Assessment Questions 1. MECHANISM: How did the injury happen? Eating and cracked tooth 2. ONSET: When did the injury happen? (e.g., minutes or hours ago)      3 days ago 3. LOCATION: What part of the tooth is injured?      Top of tooth 4. APPEARANCE: What does the tooth look like?      Gum is red, L jaw, swollen, yellow dot 5. BLEEDING: Is the mouth still bleeding? If Yes, ask: Is it difficult to stop?      denies 6. PAIN: Is it painful? If Yes, ask: How bad is the pain? (Scale 0-10; or none, mild, moderate, severe)     You have no idea, states took 600 mg ibuprofen, states 8 after meds 8. OTHER SYMPTOMS: Do you have any other symptoms?      Denies fevers-taking antipyretics. Pt states that she had a fever yesterday.   Asking for doxycycline , states last April they gave her 14 100mg  tabs. Denies having dental appt at this time, was advised to f/u with dental as well. Pt needs refill on ozempic .  Protocols used: Tooth Injury-A-AH
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
## 2023-12-20 ENCOUNTER — Other Ambulatory Visit: Payer: Self-pay | Admitting: Family

## 2023-12-20 NOTE — Telephone Encounter (Signed)
 Unable to leave a vml because the vml was full.

## 2023-12-20 NOTE — Telephone Encounter (Signed)
 I called Virginia Flores and left a vml to call back.

## 2023-12-24 NOTE — Telephone Encounter (Signed)
 I called Virginia Flores's home number and left a vml asking that she gives us  a call back this week to confirm her procedure date of 02/18/24, otherwise we would have to cancel the procedure.  (I will try the patient again later this week in case she is out of town for the holiday).

## 2023-12-25 ENCOUNTER — Ambulatory Visit (HOSPITAL_BASED_OUTPATIENT_CLINIC_OR_DEPARTMENT_OTHER)
Admission: RE | Admit: 2023-12-25 | Discharge: 2023-12-25 | Disposition: A | Discharge status: Home / Self Care | Payer: Medicare (Managed Care) | Source: Ambulatory Visit | Attending: Cardiology | Admitting: Cardiology

## 2023-12-25 ENCOUNTER — Other Ambulatory Visit (HOSPITAL_COMMUNITY): Payer: Self-pay

## 2023-12-25 ENCOUNTER — Other Ambulatory Visit: Payer: Self-pay

## 2023-12-25 DIAGNOSIS — R9389 Abnormal findings on diagnostic imaging of other specified body structures: Secondary | ICD-10-CM | POA: Insufficient documentation

## 2023-12-26 ENCOUNTER — Ambulatory Visit: Payer: Self-pay | Admitting: Cardiology

## 2023-12-26 ENCOUNTER — Other Ambulatory Visit (HOSPITAL_COMMUNITY): Payer: Self-pay

## 2023-12-26 LAB — BASIC METABOLIC PANEL WITH GFR
BUN/Creatinine Ratio: 12 (ref 12–28)
BUN: 11 mg/dL (ref 8–27)
CO2: 24 mmol/L (ref 20–29)
Calcium: 9 mg/dL (ref 8.7–10.3)
Chloride: 103 mmol/L (ref 96–106)
Creatinine, Ser: 0.9 mg/dL (ref 0.57–1.00)
Glucose: 128 mg/dL — ABNORMAL HIGH (ref 70–99)
Potassium: 4.3 mmol/L (ref 3.5–5.2)
Sodium: 143 mmol/L (ref 134–144)
eGFR: 67 mL/min/1.73

## 2024-01-01 ENCOUNTER — Other Ambulatory Visit (HOSPITAL_BASED_OUTPATIENT_CLINIC_OR_DEPARTMENT_OTHER): Payer: Self-pay

## 2024-01-01 NOTE — Telephone Encounter (Signed)
"  Lmtcb.  "

## 2024-01-08 ENCOUNTER — Ambulatory Visit (HOSPITAL_BASED_OUTPATIENT_CLINIC_OR_DEPARTMENT_OTHER)
Admission: RE | Admit: 2024-01-08 | Discharge: 2024-01-08 | Disposition: A | Discharge status: Home / Self Care | Payer: Medicare (Managed Care) | Source: Ambulatory Visit | Attending: Cardiology | Admitting: Cardiology

## 2024-01-08 DIAGNOSIS — R0609 Other forms of dyspnea: Secondary | ICD-10-CM | POA: Insufficient documentation

## 2024-01-08 LAB — ECHOCARDIOGRAM COMPLETE
AR max vel: 1.13 cm2
AV Area VTI: 1.24 cm2
AV Area mean vel: 1.28 cm2
AV Mean grad: 11 mmHg
AV Peak grad: 20.6 mmHg
Ao pk vel: 2.27 m/s
Area-P 1/2: 2.16 cm2
Calc EF: 74.7 %
MV M vel: 4.37 m/s
MV Peak grad: 76.4 mmHg
S' Lateral: 2.1 cm
Single Plane A2C EF: 77.4 %
Single Plane A4C EF: 71.3 %

## 2024-01-23 NOTE — Telephone Encounter (Signed)
 Letter was mailed to the patient after last call.

## 2024-01-23 NOTE — Telephone Encounter (Signed)
 Called patient's home number and cell phone. Unable to leave a message on either.  Called patient's emergency contact, her husband, Toribio, and left a detailed message that we need to cancel her procedure unless she confirms the appointment right away.  MyChart message sent to patient that we will need to cancel her appointment.  Asked her to call us  to let us  know is she is still interested in having a colonoscopy

## 2024-01-23 NOTE — Telephone Encounter (Signed)
 Left a message stating that we have made several attempts to reach her to discuss the hospital procedure that is scheduled for 02/18/24 and we need a call back to let us  know if she would like to keep the scheduled date.

## 2024-01-24 NOTE — Telephone Encounter (Signed)
 Have not heard back from patient today.  Will cancel her procedure on 2-16 at [facility]

## 2024-02-18 ENCOUNTER — Encounter (HOSPITAL_COMMUNITY): Payer: Self-pay

## 2024-02-18 ENCOUNTER — Ambulatory Visit (HOSPITAL_COMMUNITY): Admit: 2024-02-18 | Payer: Medicare (Managed Care) | Admitting: Gastroenterology

## 2024-02-18 SURGERY — COLONOSCOPY
Anesthesia: Monitor Anesthesia Care

## 2024-03-19 ENCOUNTER — Ambulatory Visit: Payer: Medicare (Managed Care) | Admitting: Cardiology

## 2024-05-05 ENCOUNTER — Encounter: Payer: Medicare (Managed Care) | Admitting: Family Medicine
